# Patient Record
Sex: Female | Born: 1990 | Race: Black or African American | Hispanic: No | Marital: Single | Smoking: Former smoker
Health system: Southern US, Community
[De-identification: ages and names within clinical notes are randomized; demographics above are authoritative.]

## PROBLEM LIST (undated history)

## (undated) DIAGNOSIS — Z3490 Encounter for supervision of normal pregnancy, unspecified, unspecified trimester: Principal | ICD-10-CM

## (undated) DIAGNOSIS — G8929 Other chronic pain: Secondary | ICD-10-CM

## (undated) DIAGNOSIS — F32A Depression, unspecified: Secondary | ICD-10-CM

## (undated) DIAGNOSIS — R87629 Unspecified abnormal cytological findings in specimens from vagina: Secondary | ICD-10-CM

## (undated) DIAGNOSIS — Z789 Other specified health status: Secondary | ICD-10-CM

## (undated) DIAGNOSIS — F419 Anxiety disorder, unspecified: Secondary | ICD-10-CM

## (undated) DIAGNOSIS — F99 Mental disorder, not otherwise specified: Secondary | ICD-10-CM

## (undated) DIAGNOSIS — F329 Major depressive disorder, single episode, unspecified: Secondary | ICD-10-CM

## (undated) HISTORY — DX: Other chronic pain: G89.29

## (undated) HISTORY — DX: Depression, unspecified: F32.A

## (undated) HISTORY — DX: Encounter for supervision of normal pregnancy, unspecified, unspecified trimester: Z34.90

## (undated) HISTORY — DX: Unspecified abnormal cytological findings in specimens from vagina: R87.629

## (undated) HISTORY — PX: EXTERNAL EAR SURGERY: SHX627

## (undated) HISTORY — DX: Anxiety disorder, unspecified: F41.9

## (undated) HISTORY — DX: Mental disorder, not otherwise specified: F99

## (undated) HISTORY — DX: Other specified health status: Z78.9

## (undated) HISTORY — PX: LEG SURGERY: SHX1003

---
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
## 2012-06-16 ENCOUNTER — Other Ambulatory Visit: Payer: Self-pay | Admitting: *Deleted

## 2012-06-16 MED ORDER — NORGESTIM-ETH ESTRAD TRIPHASIC 0.18/0.215/0.25 MG-25 MCG PO TABS: 1.0000 | ORAL_TABLET | Freq: Every day | ORAL | Status: DC

## 2012-07-20 ENCOUNTER — Other Ambulatory Visit: Payer: Self-pay | Admitting: Nurse Practitioner

## 2012-07-22 NOTE — Telephone Encounter (Signed)
Patient last seen in office on 07-02-11 by [facility]. Please advise

## 2013-03-31 ENCOUNTER — Ambulatory Visit: Payer: Self-pay | Admitting: General Practice

## 2013-04-03 ENCOUNTER — Encounter: Payer: Self-pay | Admitting: General Practice

## 2013-04-03 ENCOUNTER — Ambulatory Visit (INDEPENDENT_AMBULATORY_CARE_PROVIDER_SITE_OTHER): Payer: Medicaid Other | Admitting: General Practice

## 2013-04-03 ENCOUNTER — Encounter (INDEPENDENT_AMBULATORY_CARE_PROVIDER_SITE_OTHER): Payer: Self-pay

## 2013-04-03 VITALS — BP 134/92 | HR 83 | Temp 97.1°F | Ht 66.0 in | Wt 211.0 lb

## 2013-04-03 DIAGNOSIS — N926 Irregular menstruation, unspecified: Secondary | ICD-10-CM

## 2013-04-03 DIAGNOSIS — Z3490 Encounter for supervision of normal pregnancy, unspecified, unspecified trimester: Secondary | ICD-10-CM

## 2013-04-03 LAB — POCT URINE PREGNANCY: PREG TEST UR: POSITIVE

## 2013-04-03 MED ORDER — PRENATAL VITAMINS 28-0.8 MG PO TABS
1.0000 | ORAL_TABLET | Freq: Every day | ORAL | Status: DC
Start: 2013-04-03 — End: 2013-06-15

## 2013-04-03 NOTE — Progress Notes (Signed)
   Subjective:    Patient ID: Tiffany [name]. [name], female    DOB: [birth year], 23 y.o.   MRN: [id number]  HPI Patient presents today for pregnancy test. She reports taking home pregnancy test with positive results. Reports last menstrual cycle began on February 04, 2013. Denies spotting or abdominal pain.     Review of Systems  Constitutional: Negative for fever and chills.  Respiratory: Negative for chest tightness and shortness of breath.   Cardiovascular: Negative for chest pain and palpitations.  Gastrointestinal: Negative for abdominal pain.  Neurological: Negative for dizziness, weakness and headaches.  Psychiatric/Behavioral: Negative for suicidal ideas, sleep disturbance and self-injury.  All other systems reviewed and are negative.       Objective:   Physical Exam  Constitutional: She is oriented to person, place, and time. She appears well-developed and well-nourished.  Cardiovascular: Normal rate, regular rhythm and normal heart sounds.   Pulmonary/Chest: Effort normal and breath sounds normal. No respiratory distress. She exhibits no tenderness.  Abdominal: Soft. Bowel sounds are normal. She exhibits no distension. There is no tenderness.  Neurological: She is alert and oriented to person, place, and time.  Skin: Skin is warm and dry.  Psychiatric: She has a normal mood and affect.      Results for orders placed in visit on 04/03/13  POCT URINE PREGNANCY      Result Value Ref Range   Preg Test, Ur Positive         Assessment & Plan:  1. Irregular menses  - POCT urine pregnancy  2. Pregnancy  - Ambulatory referral to Obstetrics / Gynecology - Prenatal Vit-Fe Fumarate-FA (PRENATAL VITAMINS) 28-0.8 MG TABS; Take 1 tablet by mouth daily.  Dispense: 30 tablet; Refill: 1 -EDD November 11, 2013 -may seek emergency medical treatment  -Patient verbalized understanding Erby Pian, FNP-C

## 2013-04-03 NOTE — Patient Instructions (Signed)

## 2013-06-15 ENCOUNTER — Ambulatory Visit (INDEPENDENT_AMBULATORY_CARE_PROVIDER_SITE_OTHER): Payer: Medicaid Other | Admitting: Family Medicine

## 2013-06-15 ENCOUNTER — Encounter: Payer: Self-pay | Admitting: Family Medicine

## 2013-06-15 VITALS — BP 142/107 | HR 69 | Temp 98.5°F | Ht 66.0 in | Wt 222.2 lb

## 2013-06-15 DIAGNOSIS — D2330 Other benign neoplasm of skin of unspecified part of face: Secondary | ICD-10-CM

## 2013-06-15 MED ORDER — AMOXICILLIN 875 MG PO TABS: 875.0000 mg | ORAL_TABLET | Freq: Two times a day (BID) | ORAL | Status: DC

## 2013-06-15 NOTE — Progress Notes (Signed)
   Subjective:    Patient ID: Tiffany [name]. Tiffany [name], female    DOB: [birth year], 23 y.o.   MRN: [id number]  HPI C/o cyst left ear.  She has had this for a few days and wants to have it I&D'd. She has had this cyst happen before and she has had to have I&D and then it Comes back   Review of Systems    No chest pain, SOB, HA, dizziness, vision change, N/V, diarrhea, constipation, dysuria, urinary urgency or frequency, myalgias, arthralgias or rash.  Objective:   Physical Exam Vital signs noted  Well developed well nourished female.  HEENT - Head atraumatic Normocephalic Respiratory - Lungs CTA bilateral Cardiac - RRR S1 and S2 without murmur Skin - cyst left auricular area without fluctuance and tenderness       Assessment & Plan:  Cyst, dermoid, face - Plan: Ambulatory referral to Dermatology, amoxicillin (AMOXIL) 875 MG tablet Po bid x 10 days  Lysbeth Penner FNP

## 2014-01-01 ENCOUNTER — Encounter: Payer: Self-pay | Admitting: Adult Health

## 2014-01-01 ENCOUNTER — Ambulatory Visit (INDEPENDENT_AMBULATORY_CARE_PROVIDER_SITE_OTHER): Payer: Medicaid Other | Admitting: Adult Health

## 2014-01-01 VITALS — BP 118/70 | Ht 66.0 in | Wt 221.0 lb

## 2014-01-01 DIAGNOSIS — Z3201 Encounter for pregnancy test, result positive: Secondary | ICD-10-CM

## 2014-01-01 DIAGNOSIS — Z3490 Encounter for supervision of normal pregnancy, unspecified, unspecified trimester: Secondary | ICD-10-CM

## 2014-01-01 LAB — POCT URINE PREGNANCY: PREG TEST UR: POSITIVE

## 2014-01-01 MED ORDER — PRENATAL PLUS 27-1 MG PO TABS: 1.0000 | ORAL_TABLET | Freq: Every day | ORAL | Status: DC

## 2014-01-01 NOTE — Patient Instructions (Signed)
First Trimester of Pregnancy The first trimester of pregnancy is from week 1 until the end of week 12 (months 1 through 3). A week after a sperm fertilizes an egg, the egg will implant on the wall of the uterus. This embryo will begin to develop into a baby. Genes from you and your partner are forming the baby. The female genes determine whether the baby is a boy or a girl. At 6-8 weeks, the eyes and face are formed, and the heartbeat can be seen on ultrasound. At the end of 12 weeks, all the baby's organs are formed.  Now that you are pregnant, you will want to do everything you can to have a healthy baby. Two of the most important things are to get good prenatal care and to follow your health care provider's instructions. Prenatal care is all the medical care you receive before the baby's birth. This care will help prevent, find, and treat any problems during the pregnancy and childbirth. BODY CHANGES Your body goes through many changes during pregnancy. The changes vary from woman to woman.   You may gain or lose a couple of pounds at first.  You may feel sick to your stomach (nauseous) and throw up (vomit). If the vomiting is uncontrollable, call your health care provider.  You may tire easily.  You may develop headaches that can be relieved by medicines approved by your health care provider.  You may urinate more often. Painful urination may mean you have a bladder infection.  You may develop heartburn as a result of your pregnancy.  You may develop constipation because certain hormones are causing the muscles that push waste through your intestines to slow down.  You may develop hemorrhoids or swollen, bulging veins (varicose veins).  Your breasts may begin to grow larger and become tender. Your nipples may stick out more, and the tissue that surrounds them (areola) may become darker.  Your gums may bleed and may be sensitive to brushing and flossing.  Dark spots or blotches (chloasma,  mask of pregnancy) may develop on your face. This will likely fade after the baby is born.  Your menstrual periods will stop.  You may have a loss of appetite.  You may develop cravings for certain kinds of food.  You may have changes in your emotions from day to day, such as being excited to be pregnant or being concerned that something may go wrong with the pregnancy and baby.  You may have more vivid and strange dreams.  You may have changes in your hair. These can include thickening of your hair, rapid growth, and changes in texture. Some women also have hair loss during or after pregnancy, or hair that feels dry or thin. Your hair will most likely return to normal after your baby is born. WHAT TO EXPECT AT YOUR PRENATAL VISITS During a routine prenatal visit:  You will be weighed to make sure you and the baby are growing normally.  Your blood pressure will be taken.  Your abdomen will be measured to track your baby's growth.  The fetal heartbeat will be listened to starting around week 10 or 12 of your pregnancy.  Test results from any previous visits will be discussed. Your health care provider may ask you:  How you are feeling.  If you are feeling the baby move.  If you have had any abnormal symptoms, such as leaking fluid, bleeding, severe headaches, or abdominal cramping.  If you have any questions. Other tests   that may be performed during your first trimester include:  Blood tests to find your blood type and to check for the presence of any previous infections. They will also be used to check for low iron levels (anemia) and Rh antibodies. Later in the pregnancy, blood tests for diabetes will be done along with other tests if problems develop.  Urine tests to check for infections, diabetes, or protein in the urine.  An ultrasound to confirm the proper growth and development of the baby.  An amniocentesis to check for possible genetic problems.  Fetal screens for  spina bifida and Down syndrome.  You may need other tests to make sure you and the baby are doing well. HOME CARE INSTRUCTIONS  Medicines  Follow your health care provider's instructions regarding medicine use. Specific medicines may be either safe or unsafe to take during pregnancy.  Take your prenatal vitamins as directed.  If you develop constipation, try taking a stool softener if your health care provider approves. Diet  Eat regular, well-balanced meals. Choose a variety of foods, such as meat or vegetable-based protein, fish, milk and low-fat dairy products, vegetables, fruits, and whole grain breads and cereals. Your health care provider will help you determine the amount of weight gain that is right for you.  Avoid raw meat and uncooked cheese. These carry germs that can cause birth defects in the baby.  Eating four or five small meals rather than three large meals a day may help relieve nausea and vomiting. If you start to feel nauseous, eating a few soda crackers can be helpful. Drinking liquids between meals instead of during meals also seems to help nausea and vomiting.  If you develop constipation, eat more high-fiber foods, such as fresh vegetables or fruit and whole grains. Drink enough fluids to keep your urine clear or pale yellow. Activity and Exercise  Exercise only as directed by your health care provider. Exercising will help you:  Control your weight.  Stay in shape.  Be prepared for labor and delivery.  Experiencing pain or cramping in the lower abdomen or low back is a good sign that you should stop exercising. Check with your health care provider before continuing normal exercises.  Try to avoid standing for long periods of time. Move your legs often if you must stand in one place for a long time.  Avoid heavy lifting.  Wear low-heeled shoes, and practice good posture.  You may continue to have sex unless your health care provider directs you  otherwise. Relief of Pain or Discomfort  Wear a good support bra for breast tenderness.   Take warm sitz baths to soothe any pain or discomfort caused by hemorrhoids. Use hemorrhoid cream if your health care provider approves.   Rest with your legs elevated if you have leg cramps or low back pain.  If you develop varicose veins in your legs, wear support hose. Elevate your feet for 15 minutes, 3-4 times a day. Limit salt in your diet. Prenatal Care  Schedule your prenatal visits by the twelfth week of pregnancy. They are usually scheduled monthly at first, then more often in the last 2 months before delivery.  Write down your questions. Take them to your prenatal visits.  Keep all your prenatal visits as directed by your health care provider. Safety  Wear your seat belt at all times when driving.  Make a list of emergency phone numbers, including numbers for family, friends, the hospital, and police and fire departments. General Tips    Ask your health care provider for a referral to a local prenatal education class. Begin classes no later than at the beginning of month 6 of your pregnancy.  Ask for help if you have counseling or nutritional needs during pregnancy. Your health care provider can offer advice or refer you to specialists for help with various needs.  Do not use hot tubs, steam rooms, or saunas.  Do not douche or use tampons or scented sanitary pads.  Do not cross your legs for long periods of time.  Avoid cat litter boxes and soil used by cats. These carry germs that can cause birth defects in the baby and possibly loss of the fetus by miscarriage or stillbirth.  Avoid all smoking, herbs, alcohol, and medicines not prescribed by your health care provider. Chemicals in these affect the formation and growth of the baby.  Schedule a dentist appointment. At home, brush your teeth with a soft toothbrush and be gentle when you floss. SEEK MEDICAL CARE IF:   You have  dizziness.  You have mild pelvic cramps, pelvic pressure, or nagging pain in the abdominal area.  You have persistent nausea, vomiting, or diarrhea.  You have a bad smelling vaginal discharge.  You have pain with urination.  You notice increased swelling in your face, hands, legs, or ankles. SEEK IMMEDIATE MEDICAL CARE IF:   You have a fever.  You are leaking fluid from your vagina.  You have spotting or bleeding from your vagina.  You have severe abdominal cramping or pain.  You have rapid weight gain or loss.  You vomit blood or material that looks like coffee grounds.  You are exposed to [place] measles and have never had them.  You are exposed to fifth disease or chickenpox.  You develop a severe headache.  You have shortness of breath.  You have any kind of trauma, such as from a fall or a car accident. Document Released: 01/06/2001 Document Revised: 05/29/2013 Document Reviewed: 11/22/2012 [facility] Patient Information 2015 [place], [place]. This information is not intended to replace advice given to you by your health care provider. Make sure you discuss any questions you have with your health care provider. Eat often Follow up in 1 week for [place]

## 2014-01-01 NOTE — Progress Notes (Signed)
Subjective:     Patient ID: Gwynne Edinger. [name], female   DOB: 03-31-1990, 23 y.o.   MRN: [id number]  HPI Franceen is a 23 year old black female in for UPT,has nausea at times, no vomiting.  Review of Systems See HPI Reviewed past medical,surgical, social and family history. Reviewed medications and allergies.     Objective:   Physical Exam BP 118/70 mmHg  Ht 5\' 6"  (1.676 m)  Wt 221 lb (100.245 kg)  BMI 35.69 kg/m2  LMP 11/16/2013   UPT +, about 6+4 weeks by LMP EDD 08/24/14, medicaid form given, eat often and do not drink and eat at same time.  Assessment:    Pregnant +UPT    Plan:     Return in 1 week for dating [place] Rx prenatal plus #30 1 daily with 11 refills Review handout on first trimester

## 2014-01-08 ENCOUNTER — Other Ambulatory Visit: Payer: Self-pay | Admitting: Adult Health

## 2014-01-08 ENCOUNTER — Ambulatory Visit (INDEPENDENT_AMBULATORY_CARE_PROVIDER_SITE_OTHER): Payer: Medicaid Other

## 2014-01-08 DIAGNOSIS — O3680X1 Pregnancy with inconclusive fetal viability, fetus 1: Secondary | ICD-10-CM

## 2014-01-08 DIAGNOSIS — Z3490 Encounter for supervision of normal pregnancy, unspecified, unspecified trimester: Secondary | ICD-10-CM

## 2014-01-08 NOTE — Progress Notes (Signed)
U/S(7+4wks)-single active fetus, CRL c/w dates, cx appears closed, bilateral adnexa appears closed, FHR-154 bpm

## 2014-01-15 ENCOUNTER — Encounter: Payer: Self-pay | Admitting: Women's Health

## 2014-01-15 ENCOUNTER — Ambulatory Visit (INDEPENDENT_AMBULATORY_CARE_PROVIDER_SITE_OTHER): Payer: Medicaid Other | Admitting: Women's Health

## 2014-01-15 VITALS — BP 102/74 | Wt 221.0 lb

## 2014-01-15 DIAGNOSIS — Z1389 Encounter for screening for other disorder: Secondary | ICD-10-CM

## 2014-01-15 DIAGNOSIS — Z331 Pregnant state, incidental: Secondary | ICD-10-CM

## 2014-01-15 DIAGNOSIS — Z640 Problems related to unwanted pregnancy: Secondary | ICD-10-CM

## 2014-01-15 LAB — POCT URINALYSIS DIPSTICK
Blood, UA: NEGATIVE
GLUCOSE UA: NEGATIVE
Ketones, UA: NEGATIVE
LEUKOCYTES UA: NEGATIVE
Nitrite, UA: NEGATIVE
Protein, UA: NEGATIVE

## 2014-01-15 NOTE — Progress Notes (Signed)
Pt here for new ob visit- told nurse she is having an abortion. Pt not seen by me, no need for new ob visit.

## 2014-01-26 NOTE — L&D Delivery Note (Signed)
Delivery Note After AROM at 0533, patient quickly progressed to complete and delivered with 3 pushes.   At 5:50 AM a viable female was delivered via Vaginal, Spontaneous Delivery (Presentation: OA).  APGAR: 9, 9; weight pending .   Placenta status: Intact, Spontaneous.  Cord: 3 vessels with the following complications: None.  Cord pH: n/a  Anesthesia: None  Episiotomy: None Lacerations: None Suture Repair: n/a Est. Blood Loss (mL): 177  Mom to postpartum.  Baby to Couplet care / Skin to Skin.  Virginia Crews, MD, MPH PGY-2,  [facility] 08/09/2014 6:03 AM   Patient is a G2P1001 at [redacted]w[redacted]d who was admitted w/ SOL, uncomplicated prenatal course.  She progressed without augmentation via AROM.  I was gloved and present for delivery in its entirety.  Second stage of labor progressed, baby delivered after a few contractions.  No decels during second stage noted.  Complications: none  Lacerations: none    SHAW, KIMBERLY, CNM 7:10 AM

## 2014-03-20 ENCOUNTER — Encounter: Payer: Self-pay | Admitting: Women's Health

## 2014-03-20 ENCOUNTER — Ambulatory Visit (INDEPENDENT_AMBULATORY_CARE_PROVIDER_SITE_OTHER): Payer: Medicaid Other | Admitting: Women's Health

## 2014-03-20 ENCOUNTER — Other Ambulatory Visit: Payer: Self-pay | Admitting: Women's Health

## 2014-03-20 VITALS — BP 112/62 | HR 104 | Wt 225.0 lb

## 2014-03-20 DIAGNOSIS — Z3492 Encounter for supervision of normal pregnancy, unspecified, second trimester: Secondary | ICD-10-CM

## 2014-03-20 DIAGNOSIS — Z363 Encounter for antenatal screening for malformations: Secondary | ICD-10-CM

## 2014-03-20 DIAGNOSIS — Z1389 Encounter for screening for other disorder: Secondary | ICD-10-CM

## 2014-03-20 DIAGNOSIS — Z331 Pregnant state, incidental: Secondary | ICD-10-CM

## 2014-03-20 DIAGNOSIS — Z118 Encounter for screening for other infectious and parasitic diseases: Secondary | ICD-10-CM

## 2014-03-20 DIAGNOSIS — Z0283 Encounter for blood-alcohol and blood-drug test: Secondary | ICD-10-CM

## 2014-03-20 DIAGNOSIS — Z130 Encounter for screening for diseases of the blood and blood-forming organs and certain disorders involving the immune mechanism: Secondary | ICD-10-CM

## 2014-03-20 DIAGNOSIS — Z113 Encounter for screening for infections with a predominantly sexual mode of transmission: Secondary | ICD-10-CM

## 2014-03-20 DIAGNOSIS — Z3490 Encounter for supervision of normal pregnancy, unspecified, unspecified trimester: Secondary | ICD-10-CM | POA: Insufficient documentation

## 2014-03-20 DIAGNOSIS — Z0184 Encounter for antibody response examination: Secondary | ICD-10-CM

## 2014-03-20 DIAGNOSIS — Z8659 Personal history of other mental and behavioral disorders: Secondary | ICD-10-CM

## 2014-03-20 DIAGNOSIS — O99891 Other specified diseases and conditions complicating pregnancy: Secondary | ICD-10-CM | POA: Insufficient documentation

## 2014-03-20 DIAGNOSIS — O9989 Other specified diseases and conditions complicating pregnancy, childbirth and the puerperium: Secondary | ICD-10-CM

## 2014-03-20 DIAGNOSIS — Z114 Encounter for screening for human immunodeficiency virus [HIV]: Secondary | ICD-10-CM

## 2014-03-20 DIAGNOSIS — Z1159 Encounter for screening for other viral diseases: Secondary | ICD-10-CM

## 2014-03-20 LAB — POCT URINALYSIS DIPSTICK
Glucose, UA: NEGATIVE
Nitrite, UA: NEGATIVE
Protein, UA: NEGATIVE
RBC UA: NEGATIVE

## 2014-03-20 LAB — OB RESULTS CONSOLE RUBELLA ANTIBODY, IGM: Rubella: UNDETERMINED

## 2014-03-20 LAB — OB RESULTS CONSOLE ABO/RH: RH Type: NEGATIVE

## 2014-03-20 LAB — OB RESULTS CONSOLE ANTIBODY SCREEN: ANTIBODY SCREEN: NEGATIVE

## 2014-03-20 LAB — OB RESULTS CONSOLE RPR: RPR: NONREACTIVE

## 2014-03-20 LAB — OB RESULTS CONSOLE HEPATITIS B SURFACE ANTIGEN: Hepatitis B Surface Ag: NEGATIVE

## 2014-03-20 NOTE — Progress Notes (Signed)
  Subjective:  Tiffany [name] is a 24 y.o. G43P1001 African American female at [redacted]w[redacted]d by LMP c/w 7wk u/s, being seen today for her first obstetrical visit.  Her obstetrical history is significant for term uncomplicated SVB x 1.  Had last baby at [facility]- states she is planning on delivering there again- advised that she would need to establish care w/ providers that deliver at [facility] if that were the case. Pt states she 'will try women's'. Was planning TAB earlier in pregnancy, but decided against. 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 Went to ED and dx w/ UTI, on amoxicillin now. Pregnancy history fully reviewed.  Patient reports crying all the time- is able to eat/sleep/still finds joy in things she used to, denies SI/HI. Just upset about pregnancy. 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 Marland Kitchen Denies vb, cramping, uti s/s, abnormal/malodorous vag d/c, or vulvovaginal itching/irritation.  BP 112/62 mmHg  Pulse 104  Wt 225 lb (102.059 kg)  LMP 11/16/2013  HISTORY: OB History  Gravida Para Term Preterm AB SAB TAB Ectopic Multiple Living  2 1 1       1     # Outcome Date GA Lbr Len/2nd Weight Sex Delivery Anes PTL Lv  2 Current           1 Term 09/02/07 [redacted]w[redacted]d   M Vag-Spont  N      Past Medical History  Diagnosis Date  . Pregnant 01/01/2014  . Medical history non-contributory    Past Surgical History  Procedure Laterality Date  . Leg surgery Left   . External ear surgery Left    Family History  Problem Relation Age of Onset  . Asthma Sister   . Asthma Sister   . Hypertension Maternal Grandfather   . Other Maternal Grandfather     blood clots    Exam   System:     General: Well developed & nourished, no acute distress   Skin: Warm & dry, normal coloration and turgor, no rashes   Neurologic: Alert & oriented, normal mood   Cardiovascular: Regular rate & rhythm   Respiratory: Effort & rate normal, LCTAB, acyanotic   Abdomen: Soft, non tender   Extremities: normal strength, tone  Thin prep pap smear 2015 in [place]: normal  per pt  FHR: 150 via doppler   Assessment:   Pregnancy: G2P1001 Patient Active Problem List   Diagnosis Date Noted  . Supervision of normal pregnancy 03/20/2014    Priority: High  . Pregnant 01/01/2014    [redacted]w[redacted]d G2P1001 New OB visit   Plan:  Initial labs drawn Continue prenatal vitamins Problem list reviewed and updated Reviewed n/v relief measures and warning s/s to report Reviewed recommended weight gain based on pre-gravid BMI Encouraged well-balanced diet Genetic Screening discussed Quad Screen: declined Cystic fibrosis screening discussed declined Ultrasound discussed; fetal survey: requested Follow up in 3 weeks for anatomy u/s and visit [place] completed Discussed normal to be upset when was not planning pregnancy. If sleep/eating habits/joy in things she used to find joy in become affected or develops SI/HI, to let us know.  Get pap records from [place], [facility] 03/20/2014 2:04 PM

## 2014-03-20 NOTE — Patient Instructions (Signed)
Second Trimester of Pregnancy The second trimester is from week 13 through week 28, months 4 through 6. The second trimester is often a time when you feel your best. Your body has also adjusted to being pregnant, and you begin to feel better physically. Usually, morning sickness has lessened or quit completely, you may have more energy, and you may have an increase in appetite. The second trimester is also a time when the fetus is growing rapidly. At the end of the sixth month, the fetus is about 9 inches long and weighs about 1 pounds. You will likely begin to feel the baby move (quickening) between 18 and 20 weeks of the pregnancy. BODY CHANGES Your body goes through many changes during pregnancy. The changes vary from woman to woman.   Your weight will continue to increase. You will notice your lower abdomen bulging out.  You may begin to get stretch marks on your hips, abdomen, and breasts.  You may develop headaches that can be relieved by medicines approved by your health care provider.  You may urinate more often because the fetus is pressing on your bladder.  You may develop or continue to have heartburn as a result of your pregnancy.  You may develop constipation because certain hormones are causing the muscles that push waste through your intestines to slow down.  You may develop hemorrhoids or swollen, bulging veins (varicose veins).  You may have back pain because of the weight gain and pregnancy hormones relaxing your joints between the bones in your pelvis and as a result of a shift in weight and the muscles that support your balance.  Your breasts will continue to grow and be tender.  Your gums may bleed and may be sensitive to brushing and flossing.  Dark spots or blotches (chloasma, mask of pregnancy) may develop on your face. This will likely fade after the baby is born.  A dark line from your belly button to the pubic area (linea nigra) may appear. This will likely fade  after the baby is born.  You may have changes in your hair. These can include thickening of your hair, rapid growth, and changes in texture. Some women also have hair loss during or after pregnancy, or hair that feels dry or thin. Your hair will most likely return to normal after your baby is born. WHAT TO EXPECT AT YOUR PRENATAL VISITS During a routine prenatal visit:  You will be weighed to make sure you and the fetus are growing normally.  Your blood pressure will be taken.  Your abdomen will be measured to track your baby's growth.  The fetal heartbeat will be listened to.  Any test results from the previous visit will be discussed. Your health care provider may ask you:  How you are feeling.  If you are feeling the baby move.  If you have had any abnormal symptoms, such as leaking fluid, bleeding, severe headaches, or abdominal cramping.  If you have any questions. Other tests that may be performed during your second trimester include:  Blood tests that check for:  Low iron levels (anemia).  Gestational diabetes (between 24 and 28 weeks).  Rh antibodies.  Urine tests to check for infections, diabetes, or protein in the urine.  An ultrasound to confirm the proper growth and development of the baby.  An amniocentesis to check for possible genetic problems.  Fetal screens for spina bifida and Down syndrome. HOME CARE INSTRUCTIONS   Avoid all smoking, herbs, alcohol, and unprescribed   drugs. These chemicals affect the formation and growth of the baby.  Follow your health care provider's instructions regarding medicine use. There are medicines that are either safe or unsafe to take during pregnancy.  Exercise only as directed by your health care provider. Experiencing uterine cramps is a good sign to stop exercising.  Continue to eat regular, healthy meals.  Wear a good support bra for breast tenderness.  Do not use hot tubs, steam rooms, or saunas.  Wear your  seat belt at all times when driving.  Avoid raw meat, uncooked cheese, cat litter boxes, and soil used by cats. These carry germs that can cause birth defects in the baby.  Take your prenatal vitamins.  Try taking a stool softener (if your health care provider approves) if you develop constipation. Eat more high-fiber foods, such as fresh vegetables or fruit and whole grains. Drink plenty of fluids to keep your urine clear or pale yellow.  Take warm sitz baths to soothe any pain or discomfort caused by hemorrhoids. Use hemorrhoid cream if your health care provider approves.  If you develop varicose veins, wear support hose. Elevate your feet for 15 minutes, 3-4 times a day. Limit salt in your diet.  Avoid heavy lifting, wear low heel shoes, and practice good posture.  Rest with your legs elevated if you have leg cramps or low back pain.  Visit your dentist if you have not gone yet during your pregnancy. Use a soft toothbrush to brush your teeth and be gentle when you floss.  A sexual relationship may be continued unless your health care provider directs you otherwise.  Continue to go to all your prenatal visits as directed by your health care provider. SEEK MEDICAL CARE IF:   You have dizziness.  You have mild pelvic cramps, pelvic pressure, or nagging pain in the abdominal area.  You have persistent nausea, vomiting, or diarrhea.  You have a bad smelling vaginal discharge.  You have pain with urination. SEEK IMMEDIATE MEDICAL CARE IF:   You have a fever.  You are leaking fluid from your vagina.  You have spotting or bleeding from your vagina.  You have severe abdominal cramping or pain.  You have rapid weight gain or loss.  You have shortness of breath with chest pain.  You notice sudden or extreme swelling of your face, hands, ankles, feet, or legs.  You have not felt your baby move in over an hour.  You have severe headaches that do not go away with  medicine.  You have vision changes. Document Released: 01/06/2001 Document Revised: 01/17/2013 Document Reviewed: 03/15/2012 [employer] Patient Information 2015 [employer]. This information is not intended to replace advice given to you by your health care provider. Make sure you discuss any questions you have with your health care provider.  

## 2014-03-21 LAB — CBC
HCT: 35.3 % (ref 34.0–46.6)
Hemoglobin: 11.9 g/dL (ref 11.1–15.9)
MCH: 27 pg (ref 26.6–33.0)
MCHC: 33.7 g/dL (ref 31.5–35.7)
MCV: 80 fL (ref 79–97)
PLATELETS: 299 10*3/uL (ref 150–379)
RBC: 4.4 x10E6/uL (ref 3.77–5.28)
RDW: 14.3 % (ref 12.3–15.4)
WBC: 7.8 10*3/uL (ref 3.4–10.8)

## 2014-03-21 LAB — HEPATITIS B SURFACE ANTIGEN: Hepatitis B Surface Ag: NEGATIVE

## 2014-03-21 LAB — URINALYSIS, ROUTINE W REFLEX MICROSCOPIC
BILIRUBIN UA: NEGATIVE
Glucose, UA: NEGATIVE
NITRITE UA: NEGATIVE
PH UA: 7.5 (ref 5.0–7.5)
RBC, UA: NEGATIVE
SPEC GRAV UA: 1.024 (ref 1.005–1.030)
Urobilinogen, Ur: 1 mg/dL (ref 0.2–1.0)

## 2014-03-21 LAB — RPR: RPR: NONREACTIVE

## 2014-03-21 LAB — SICKLE CELL SCREEN: Sickle Cell Screen: NEGATIVE

## 2014-03-21 LAB — ANTIBODY SCREEN: Antibody Screen: NEGATIVE

## 2014-03-21 LAB — MICROSCOPIC EXAMINATION
Casts: NONE SEEN /lpf
Epithelial Cells (non renal): >10 /hpf — AB (ref 0–10)

## 2014-03-21 LAB — ABO/RH: Rh Factor: NEGATIVE

## 2014-03-21 LAB — VARICELLA ZOSTER ANTIBODY, IGG: Varicella zoster IgG: 692 index (ref >165)

## 2014-03-21 LAB — PMP SCREEN PROFILE (10S), URINE
AMPHETAMINE SCRN UR: NEGATIVE ng/mL
Barbiturate Screen, Ur: NEGATIVE ng/mL
Benzodiazepine Screen, Urine: NEGATIVE ng/mL
CANNABINOIDS UR QL SCN: POSITIVE ng/mL
COCAINE(METAB.) SCREEN, URINE: NEGATIVE ng/mL
Creatinine(Crt), U: 160.9 mg/dL (ref 20.0–300.0)
METHADONE SCREEN, URINE: NEGATIVE ng/mL
OPIATE SCRN UR: NEGATIVE ng/mL
Oxycodone+Oxymorphone Ur Ql Scn: NEGATIVE ng/mL
PCP Scrn, Ur: NEGATIVE ng/mL
PH UR, DRUG SCRN: 7.3 (ref 4.5–8.9)
Propoxyphene, Screen: NEGATIVE ng/mL

## 2014-03-21 LAB — HIV ANTIBODY (ROUTINE TESTING W REFLEX): HIV Screen 4th Generation wRfx: NONREACTIVE

## 2014-03-21 LAB — RUBELLA SCREEN: Rubella Antibodies, IGG: <0.9 index — ABNORMAL LOW (ref >0.99)

## 2014-03-22 LAB — URINE CULTURE: Organism ID, Bacteria: NO GROWTH

## 2014-03-22 LAB — GC/CHLAMYDIA PROBE AMP
Chlamydia trachomatis, NAA: NEGATIVE
NEISSERIA GONORRHOEAE BY PCR: NEGATIVE

## 2014-03-24 ENCOUNTER — Encounter: Payer: Self-pay | Admitting: Women's Health

## 2014-03-24 DIAGNOSIS — F1290 Cannabis use, unspecified, uncomplicated: Secondary | ICD-10-CM | POA: Insufficient documentation

## 2014-03-24 DIAGNOSIS — O09899 Supervision of other high risk pregnancies, unspecified trimester: Secondary | ICD-10-CM | POA: Insufficient documentation

## 2014-03-24 DIAGNOSIS — O9989 Other specified diseases and conditions complicating pregnancy, childbirth and the puerperium: Secondary | ICD-10-CM

## 2014-03-24 DIAGNOSIS — Z283 Underimmunization status: Secondary | ICD-10-CM | POA: Insufficient documentation

## 2014-03-24 DIAGNOSIS — Z6791 Unspecified blood type, Rh negative: Secondary | ICD-10-CM | POA: Insufficient documentation

## 2014-03-24 DIAGNOSIS — O26899 Other specified pregnancy related conditions, unspecified trimester: Secondary | ICD-10-CM

## 2014-04-03 ENCOUNTER — Telehealth: Payer: Self-pay | Admitting: *Deleted

## 2014-04-03 NOTE — Telephone Encounter (Signed)
Pt informed it is safe to eat crab legs during pregnancy.

## 2014-04-12 ENCOUNTER — Other Ambulatory Visit: Payer: Self-pay | Admitting: Women's Health

## 2014-04-12 ENCOUNTER — Encounter: Payer: Self-pay | Admitting: *Deleted

## 2014-04-12 ENCOUNTER — Other Ambulatory Visit: Payer: Medicaid Other

## 2014-04-12 ENCOUNTER — Ambulatory Visit (INDEPENDENT_AMBULATORY_CARE_PROVIDER_SITE_OTHER): Payer: Medicaid Other | Admitting: Obstetrics & Gynecology

## 2014-04-12 ENCOUNTER — Encounter: Payer: Self-pay | Admitting: Obstetrics & Gynecology

## 2014-04-12 ENCOUNTER — Encounter: Payer: Medicaid Other | Admitting: Advanced Practice Midwife

## 2014-04-12 ENCOUNTER — Ambulatory Visit (INDEPENDENT_AMBULATORY_CARE_PROVIDER_SITE_OTHER): Payer: Medicaid Other

## 2014-04-12 VITALS — BP 118/60 | HR 80 | Wt 224.0 lb

## 2014-04-12 DIAGNOSIS — O99320 Drug use complicating pregnancy, unspecified trimester: Secondary | ICD-10-CM

## 2014-04-12 DIAGNOSIS — Z1389 Encounter for screening for other disorder: Secondary | ICD-10-CM

## 2014-04-12 DIAGNOSIS — Z331 Pregnant state, incidental: Secondary | ICD-10-CM

## 2014-04-12 DIAGNOSIS — Z363 Encounter for antenatal screening for malformations: Secondary | ICD-10-CM

## 2014-04-12 DIAGNOSIS — Z36 Encounter for antenatal screening of mother: Secondary | ICD-10-CM | POA: Diagnosis not present

## 2014-04-12 DIAGNOSIS — Z3492 Encounter for supervision of normal pregnancy, unspecified, second trimester: Secondary | ICD-10-CM

## 2014-04-12 LAB — POCT URINALYSIS DIPSTICK
Blood, UA: NEGATIVE
Glucose, UA: NEGATIVE
Ketones, UA: NEGATIVE
Leukocytes, UA: NEGATIVE
Nitrite, UA: NEGATIVE
Protein, UA: NEGATIVE

## 2014-04-12 NOTE — Progress Notes (Signed)
U/S(21+0wks)-active fetus, meas c/w dates, fluid WNL, FHR-147 bpm, cx appears closed(4.7cm), bilateral adnexa appears WNL, posterior Gr 0 placenta, female fetus, no major abnl noted

## 2014-04-13 NOTE — Progress Notes (Signed)
Sonogram is reviewed and read today, report is done, all normal  G2P1001 [redacted]w[redacted]d Estimated Date of Delivery: 08/23/14  Blood pressure 118/60, pulse 80, weight 224 lb (101.606 kg), last menstrual period 11/16/2013.   BP weight and urine results all reviewed and noted.  Please refer to the obstetrical flow sheet for the fundal height and fetal heart rate documentation:  Patient reports good fetal movement, denies any bleeding and no rupture of membranes symptoms or regular contractions. Patient is without complaints. All questions were answered.  Plan:  Continued routine obstetrical care,   Follow up in 4 weeks for OB appointment,

## 2014-05-10 ENCOUNTER — Encounter: Payer: Self-pay | Admitting: Advanced Practice Midwife

## 2014-05-10 ENCOUNTER — Ambulatory Visit (INDEPENDENT_AMBULATORY_CARE_PROVIDER_SITE_OTHER): Payer: Medicaid Other | Admitting: Advanced Practice Midwife

## 2014-05-10 VITALS — BP 110/76 | HR 88 | Wt 231.0 lb

## 2014-05-10 DIAGNOSIS — Z331 Pregnant state, incidental: Secondary | ICD-10-CM

## 2014-05-10 DIAGNOSIS — Z3492 Encounter for supervision of normal pregnancy, unspecified, second trimester: Secondary | ICD-10-CM

## 2014-05-10 DIAGNOSIS — Z1389 Encounter for screening for other disorder: Secondary | ICD-10-CM

## 2014-05-10 LAB — POCT URINALYSIS DIPSTICK
Glucose, UA: NEGATIVE
Ketones, UA: NEGATIVE
Leukocytes, UA: NEGATIVE
NITRITE UA: NEGATIVE
Protein, UA: NEGATIVE
RBC UA: NEGATIVE

## 2014-05-10 NOTE — Progress Notes (Signed)
G2P1001 [redacted]w[redacted]d Estimated Date of Delivery: 08/23/14  Blood pressure 110/76, pulse 88, weight 231 lb (104.781 kg), last menstrual period 11/16/2013.   BP weight and urine results all reviewed and noted.  Please refer to the obstetrical flow sheet for the fundal height and fetal heart rate documentation:  Patient reports good fetal movement, denies any bleeding and no rupture of membranes symptoms or regular contractions. Patient is without complaints. All questions were answered.  Plan:  Continued routine obstetrical care,   Follow up in 3 weeks for OB appointment, PN2

## 2014-05-10 NOTE — Progress Notes (Signed)
Pt states that she has noticed some pain in her lower abdomin and on her sides, pain comes more at night.

## 2014-05-10 NOTE — Patient Instructions (Signed)

## 2014-05-31 ENCOUNTER — Other Ambulatory Visit: Payer: Medicaid Other

## 2014-05-31 ENCOUNTER — Other Ambulatory Visit: Payer: Self-pay | Admitting: Advanced Practice Midwife

## 2014-05-31 ENCOUNTER — Ambulatory Visit (INDEPENDENT_AMBULATORY_CARE_PROVIDER_SITE_OTHER): Payer: Medicaid Other | Admitting: Advanced Practice Midwife

## 2014-05-31 VITALS — BP 110/56 | HR 71 | Wt 237.0 lb

## 2014-05-31 DIAGNOSIS — Z131 Encounter for screening for diabetes mellitus: Secondary | ICD-10-CM

## 2014-05-31 DIAGNOSIS — Z369 Encounter for antenatal screening, unspecified: Secondary | ICD-10-CM

## 2014-05-31 DIAGNOSIS — Z3492 Encounter for supervision of normal pregnancy, unspecified, second trimester: Secondary | ICD-10-CM

## 2014-05-31 DIAGNOSIS — Z1389 Encounter for screening for other disorder: Secondary | ICD-10-CM

## 2014-05-31 DIAGNOSIS — Z331 Pregnant state, incidental: Secondary | ICD-10-CM

## 2014-05-31 LAB — OB RESULTS CONSOLE HIV ANTIBODY (ROUTINE TESTING): HIV: NONREACTIVE

## 2014-05-31 LAB — POCT URINALYSIS DIPSTICK
GLUCOSE UA: NEGATIVE
Ketones, UA: NEGATIVE
Leukocytes, UA: NEGATIVE
Nitrite, UA: NEGATIVE
PROTEIN UA: NEGATIVE
RBC UA: NEGATIVE

## 2014-05-31 MED ORDER — PNV PRENATAL PLUS MULTIVITAMIN 27-1 MG PO TABS: 1.0000 | ORAL_TABLET | Freq: Every day | ORAL | Status: DC

## 2014-05-31 NOTE — Patient Instructions (Signed)
Sciatica with Rehab The sciatic nerve runs from the back down the leg and is responsible for sensation and control of the muscles in the back (posterior) side of the thigh, lower leg, and foot. Sciatica is a condition that is characterized by inflammation of this nerve.  SYMPTOMS   Signs of nerve damage, including numbness and/or weakness along the posterior side of the lower extremity.  Pain in the back of the thigh that may also travel down the leg.  Pain that worsens when sitting for long periods of time.  Occasionally, pain in the back or buttock. CAUSES  Inflammation of the sciatic nerve is the cause of sciatica. The inflammation is due to something irritating the nerve. Common sources of irritation include:  Sitting for long periods of time.  Direct trauma to the nerve.  Arthritis of the spine.  Herniated or ruptured disk.  Slipping of the vertebrae (spondylolisthesis).  Pressure from soft tissues, such as muscles or ligament-like tissue (fascia). RISK INCREASES WITH:  Sports that place pressure or stress on the spine (football or weightlifting).  Poor strength and flexibility.  Failure to warm up properly before activity.  Family history of low back pain or disk disorders.  Previous back injury or surgery.  Poor body mechanics, especially when lifting, or poor posture. PREVENTION   Warm up and stretch properly before activity.  Maintain physical fitness:  Strength, flexibility, and endurance.  Cardiovascular fitness.  Learn and use proper technique, especially with posture and lifting. When possible, have coach correct improper technique.  Avoid activities that place stress on the spine. PROGNOSIS If treated properly, then sciatica usually resolves within 6 weeks. However, occasionally surgery is necessary.  RELATED COMPLICATIONS   Permanent nerve damage, including pain, numbness, tingle, or weakness.  Chronic back pain.  Risks of surgery: infection,  bleeding, nerve damage, or damage to surrounding tissues. TREATMENT Treatment initially involves resting from any activities that aggravate your symptoms. The use of ice and medication may help reduce pain and inflammation. The use of strengthening and stretching exercises may help reduce pain with activity. These exercises may be performed at home or with referral to a therapist. A therapist may recommend further treatments, such as transcutaneous electronic nerve stimulation (TENS) or ultrasound. Your caregiver may recommend corticosteroid injections to help reduce inflammation of the sciatic nerve. If symptoms persist despite non-surgical (conservative) treatment, then surgery may be recommended. MEDICATION  If pain medication is necessary, then nonsteroidal anti-inflammatory medications, such as aspirin and ibuprofen, or other minor pain relievers, such as acetaminophen, are often recommended.  Do not take pain medication for 7 days before surgery.  Prescription pain relievers may be given if deemed necessary by your caregiver. Use only as directed and only as much as you need.  Ointments applied to the skin may be helpful.  Corticosteroid injections may be given by your caregiver. These injections should be reserved for the most serious cases, because they may only be given a certain number of times. HEAT AND COLD  Cold treatment (icing) relieves pain and reduces inflammation. Cold treatment should be applied for 10 to 15 minutes every 2 to 3 hours for inflammation and pain and immediately after any activity that aggravates your symptoms. Use ice packs or massage the area with a piece of ice (ice massage).  Heat treatment may be used prior to performing the stretching and strengthening activities prescribed by your caregiver, physical therapist, or athletic trainer. Use a heat pack or soak the injury in warm water.   SEEK MEDICAL CARE IF:  Treatment seems to offer no benefit, or the condition  worsens.  Any medications produce adverse side effects. EXERCISES  RANGE OF MOTION (ROM) AND STRETCHING EXERCISES - Sciatica Most people with sciatic will find that their symptoms worsen with either excessive bending forward (flexion) or arching at the low back (extension). The exercises which will help resolve your symptoms will focus on the opposite motion. Your physician, physical therapist or athletic trainer will help you determine which exercises will be most helpful to resolve your low back pain. Do not complete any exercises without first consulting with your clinician. Discontinue any exercises which worsen your symptoms until you speak to your clinician. If you have pain, numbness or tingling which travels down into your buttocks, leg or foot, the goal of the therapy is for these symptoms to move closer to your back and eventually resolve. Occasionally, these leg symptoms will get better, but your low back pain may worsen; this is typically an indication of progress in your rehabilitation. Be certain to be very alert to any changes in your symptoms and the activities in which you participated in the 24 hours prior to the change. Sharing this information with your clinician will allow him/her to most efficiently treat your condition. These exercises may help you when beginning to rehabilitate your injury. Your symptoms may resolve with or without further involvement from your physician, physical therapist or athletic trainer. While completing these exercises, remember:   Restoring tissue flexibility helps normal motion to return to the joints. This allows healthier, less painful movement and activity.  An effective stretch should be held for at least 30 seconds.  A stretch should never be painful. You should only feel a gentle lengthening or release in the stretched tissue. FLEXION RANGE OF MOTION AND STRETCHING EXERCISES: STRETCH - Flexion, Single Knee to Chest   Lie on a firm bed or floor  with both legs extended in front of you.  Keeping one leg in contact with the floor, bring your opposite knee to your chest. Hold your leg in place by either grabbing behind your thigh or at your knee.  Pull until you feel a gentle stretch in your low back. Hold __________ seconds.  Slowly release your grasp and repeat the exercise with the opposite side. Repeat __________ times. Complete this exercise __________ times per day.  STRETCH - Flexion, Double Knee to Chest  Lie on a firm bed or floor with both legs extended in front of you.  Keeping one leg in contact with the floor, bring your opposite knee to your chest.  Tense your stomach muscles to support your back and then lift your other knee to your chest. Hold your legs in place by either grabbing behind your thighs or at your knees.  Pull both knees toward your chest until you feel a gentle stretch in your low back. Hold __________ seconds.  Tense your stomach muscles and slowly return one leg at a time to the floor. Repeat __________ times. Complete this exercise __________ times per day.  STRETCH - Low Trunk Rotation   Lie on a firm bed or floor. Keeping your legs in front of you, bend your knees so they are both pointed toward the ceiling and your feet are flat on the floor.  Extend your arms out to the side. This will stabilize your upper body by keeping your shoulders in contact with the floor.  Gently and slowly drop both knees together to one side until   you feel a gentle stretch in your low back. Hold for __________ seconds.  Tense your stomach muscles to support your low back as you bring your knees back to the starting position. Repeat the exercise to the other side. Repeat __________ times. Complete this exercise __________ times per day  EXTENSION RANGE OF MOTION AND FLEXIBILITY EXERCISES: STRETCH - Extension, Prone on Elbows  Lie on your stomach on the floor, a bed will be too soft. Place your palms about shoulder  width apart and at the height of your head.  Place your elbows under your shoulders. If this is too painful, stack pillows under your chest.  Allow your body to relax so that your hips drop lower and make contact more completely with the floor.  Hold this position for __________ seconds.  Slowly return to lying flat on the floor. Repeat __________ times. Complete this exercise __________ times per day.  RANGE OF MOTION - Extension, Prone Press Ups  Lie on your stomach on the floor, a bed will be too soft. Place your palms about shoulder width apart and at the height of your head.  Keeping your back as relaxed as possible, slowly straighten your elbows while keeping your hips on the floor. You may adjust the placement of your hands to maximize your comfort. As you gain motion, your hands will come more underneath your shoulders.  Hold this position __________ seconds.  Slowly return to lying flat on the floor. Repeat __________ times. Complete this exercise __________ times per day.  STRENGTHENING EXERCISES - Sciatica  These exercises may help you when beginning to rehabilitate your injury. These exercises should be done near your "sweet spot." This is the neutral, low-back arch, somewhere between fully rounded and fully arched, that is your least painful position. When performed in this safe range of motion, these exercises can be used for people who have either a flexion or extension based injury. These exercises may resolve your symptoms with or without further involvement from your physician, physical therapist or athletic trainer. While completing these exercises, remember:   Muscles can gain both the endurance and the strength needed for everyday activities through controlled exercises.  Complete these exercises as instructed by your physician, physical therapist or athletic trainer. Progress with the resistance and repetition exercises only as your caregiver advises.  You may  experience muscle soreness or fatigue, but the pain or discomfort you are trying to eliminate should never worsen during these exercises. If this pain does worsen, stop and make certain you are following the directions exactly. If the pain is still present after adjustments, discontinue the exercise until you can discuss the trouble with your clinician. STRENGTHENING - Deep Abdominals, Pelvic Tilt   Lie on a firm bed or floor. Keeping your legs in front of you, bend your knees so they are both pointed toward the ceiling and your feet are flat on the floor.  Tense your lower abdominal muscles to press your low back into the floor. This motion will rotate your pelvis so that your tail bone is scooping upwards rather than pointing at your feet or into the floor.  With a gentle tension and even breathing, hold this position for __________ seconds. Repeat __________ times. Complete this exercise __________ times per day.  STRENGTHENING - Abdominals, Crunches   Lie on a firm bed or floor. Keeping your legs in front of you, bend your knees so they are both pointed toward the ceiling and your feet are flat on the   floor. Cross your arms over your chest.  Slightly tip your chin down without bending your neck.  Tense your abdominals and slowly lift your trunk high enough to just clear your shoulder blades. Lifting higher can put excessive stress on the low back and does not further strengthen your abdominal muscles.  Control your return to the starting position. Repeat __________ times. Complete this exercise __________ times per day.  STRENGTHENING - Quadruped, Opposite UE/LE Lift  Assume a hands and knees position on a firm surface. Keep your hands under your shoulders and your knees under your hips. You may place padding under your knees for comfort.  Find your neutral spine and gently tense your abdominal muscles so that you can maintain this position. Your shoulders and hips should form a rectangle  that is parallel with the floor and is not twisted.  Keeping your trunk steady, lift your right hand no higher than your shoulder and then your left leg no higher than your hip. Make sure you are not holding your breath. Hold this position __________ seconds.  Continuing to keep your abdominal muscles tense and your back steady, slowly return to your starting position. Repeat with the opposite arm and leg. Repeat __________ times. Complete this exercise __________ times per day.  STRENGTHENING - Abdominals and Quadriceps, Straight Leg Raise   Lie on a firm bed or floor with both legs extended in front of you.  Keeping one leg in contact with the floor, bend the other knee so that your foot can rest flat on the floor.  Find your neutral spine, and tense your abdominal muscles to maintain your spinal position throughout the exercise.  Slowly lift your straight leg off the floor about 6 inches for a count of 15, making sure to not hold your breath.  Still keeping your neutral spine, slowly lower your leg all the way to the floor. Repeat this exercise with each leg __________ times. Complete this exercise __________ times per day. POSTURE AND BODY MECHANICS CONSIDERATIONS - Sciatica Keeping correct posture when sitting, standing or completing your activities will reduce the stress put on different body tissues, allowing injured tissues a chance to heal and limiting painful experiences. The following are general guidelines for improved posture. Your physician or physical therapist will provide you with any instructions specific to your needs. While reading these guidelines, remember:  The exercises prescribed by your provider will help you have the flexibility and strength to maintain correct postures.  The correct posture provides the optimal environment for your joints to work. All of your joints have less wear and tear when properly supported by a spine with good posture. This means you will  experience a healthier, less painful body.  Correct posture must be practiced with all of your activities, especially prolonged sitting and standing. Correct posture is as important when doing repetitive low-stress activities (typing) as it is when doing a single heavy-load activity (lifting). RESTING POSITIONS Consider which positions are most painful for you when choosing a resting position. If you have pain with flexion-based activities (sitting, bending, stooping, squatting), choose a position that allows you to rest in a less flexed posture. You would want to avoid curling into a fetal position on your side. If your pain worsens with extension-based activities (prolonged standing, working overhead), avoid resting in an extended position such as sleeping on your stomach. Most people will find more comfort when they rest with their spine in a more neutral position, neither too rounded nor too   arched. Lying on a non-sagging bed on your side with a pillow between your knees, or on your back with a pillow under your knees will often provide some relief. Keep in mind, being in any one position for a prolonged period of time, no matter how correct your posture, can still lead to stiffness. PROPER SITTING POSTURE In order to minimize stress and discomfort on your spine, you must sit with correct posture Sitting with good posture should be effortless for a healthy body. Returning to good posture is a gradual process. Many people can work toward this most comfortably by using various supports until they have the flexibility and strength to maintain this posture on their own. When sitting with proper posture, your ears will fall over your shoulders and your shoulders will fall over your hips. You should use the back of the chair to support your upper back. Your low back will be in a neutral position, just slightly arched. You may place a small pillow or folded towel at the base of your low back for support.  When  working at a desk, create an environment that supports good, upright posture. Without extra support, muscles fatigue and lead to excessive strain on joints and other tissues. Keep these recommendations in mind: CHAIR:   A chair should be able to slide under your desk when your back makes contact with the back of the chair. This allows you to work closely.  The chair's height should allow your eyes to be level with the upper part of your monitor and your hands to be slightly lower than your elbows. BODY POSITION  Your feet should make contact with the floor. If this is not possible, use a foot rest.  Keep your ears over your shoulders. This will reduce stress on your neck and low back. INCORRECT SITTING POSTURES   If you are feeling tired and unable to assume a healthy sitting posture, do not slouch or slump. This puts excessive strain on your back tissues, causing more damage and pain. Healthier options include:  Using more support, like a lumbar pillow.  Switching tasks to something that requires you to be upright or walking.  Talking a brief walk.  Lying down to rest in a neutral-spine position. PROLONGED STANDING WHILE SLIGHTLY LEANING FORWARD  When completing a task that requires you to lean forward while standing in one place for a long time, place either foot up on a stationary 2-4 inch high object to help maintain the best posture. When both feet are on the ground, the low back tends to lose its slight inward curve. If this curve flattens (or becomes too large), then the back and your other joints will experience too much stress, fatigue more quickly and can cause pain.  CORRECT STANDING POSTURES Proper standing posture should be assumed with all daily activities, even if they only take a few moments, like when brushing your teeth. As in sitting, your ears should fall over your shoulders and your shoulders should fall over your hips. You should keep a slight tension in your abdominal  muscles to brace your spine. Your tailbone should point down to the ground, not behind your body, resulting in an over-extended swayback posture.  INCORRECT STANDING POSTURES  Common incorrect standing postures include a forward head, locked knees and/or an excessive swayback. WALKING Walk with an upright posture. Your ears, shoulders and hips should all line-up. PROLONGED ACTIVITY IN A FLEXED POSITION When completing a task that requires you to bend forward   at your waist or lean over a low surface, try to find a way to stabilize 3 of 4 of your limbs. You can place a hand or elbow on your thigh or rest a knee on the surface you are reaching across. This will provide you more stability so that your muscles do not fatigue as quickly. By keeping your knees relaxed, or slightly bent, you will also reduce stress across your low back. CORRECT LIFTING TECHNIQUES DO :   Assume a wide stance. This will provide you more stability and the opportunity to get as close as possible to the object which you are lifting.  Tense your abdominals to brace your spine; then bend at the knees and hips. Keeping your back locked in a neutral-spine position, lift using your leg muscles. Lift with your legs, keeping your back straight.  Test the weight of unknown objects before attempting to lift them.  Try to keep your elbows locked down at your sides in order get the best strength from your shoulders when carrying an object.  Always ask for help when lifting heavy or awkward objects. INCORRECT LIFTING TECHNIQUES DO NOT:   Lock your knees when lifting, even if it is a small object.  Bend and twist. Pivot at your feet or move your feet when needing to change directions.  Assume that you cannot safely pick up a paperclip without proper posture. Document Released: 01/12/2005 Document Revised: 05/29/2013 Document Reviewed: 04/26/2008 [employer] Patient Information 2015 [employer]. This information is not intended to  replace advice given to you by your health care provider. Make sure you discuss any questions you have with your health care provider.  

## 2014-05-31 NOTE — Addendum Note (Signed)
Addended by: Christin Fudge on: 05/31/2014 10:11 AM   Modules accepted: Orders, Medications

## 2014-05-31 NOTE — Progress Notes (Signed)
G2P1001 [redacted]w[redacted]d Estimated Date of Delivery: 08/23/14  Blood pressure 110/56, pulse 71, weight 237 lb (107.502 kg), last menstrual period 11/16/2013.   BP weight and urine results all reviewed and noted.  Please refer to the obstetrical flow sheet for the fundal height and fetal heart rate documentation:  Patient reports good fetal movement, denies any bleeding and no rupture of membranes symptoms or regular contractions. Patient c/o lower abdominal pain on occasion.  Sounds like round ligament.  All questions were answered.  Plan:  Continued routine obstetrical care, pn2 today  Follow up in 3 weeks for OB appointment,

## 2014-06-01 LAB — HSV 2 ANTIBODY, IGG: HSV 2 Glycoprotein G Ab, IgG: <0.91 index (ref 0.00–0.90)

## 2014-06-01 LAB — GLUCOSE TOLERANCE, 2 HOURS W/ 1HR
Glucose, 1 hour: 124 mg/dL (ref 65–179)
Glucose, 2 hour: 113 mg/dL (ref 65–152)
Glucose, Fasting: 83 mg/dL (ref 65–91)

## 2014-06-01 LAB — CBC
Hematocrit: 33.5 % — ABNORMAL LOW (ref 34.0–46.6)
Hemoglobin: 11.4 g/dL (ref 11.1–15.9)
MCH: 27.2 pg (ref 26.6–33.0)
MCHC: 34 g/dL (ref 31.5–35.7)
MCV: 80 fL (ref 79–97)
Platelets: 258 10*3/uL (ref 150–379)
RBC: 4.19 x10E6/uL (ref 3.77–5.28)
RDW: 14 % (ref 12.3–15.4)
WBC: 10.4 10*3/uL (ref 3.4–10.8)

## 2014-06-01 LAB — ANTIBODY SCREEN: Antibody Screen: NEGATIVE

## 2014-06-01 LAB — SYPHILIS: RPR W/REFLEX TO RPR TITER AND TREPONEMAL ANTIBODIES, TRADITIONAL SCREENING AND DIAGNOSIS ALGORITHM: RPR Ser Ql: NONREACTIVE

## 2014-06-01 LAB — HIV ANTIBODY (ROUTINE TESTING W REFLEX): HIV Screen 4th Generation wRfx: NONREACTIVE

## 2014-06-21 ENCOUNTER — Encounter: Payer: Self-pay | Admitting: Advanced Practice Midwife

## 2014-06-21 ENCOUNTER — Ambulatory Visit (INDEPENDENT_AMBULATORY_CARE_PROVIDER_SITE_OTHER): Payer: Medicaid Other | Admitting: Advanced Practice Midwife

## 2014-06-21 VITALS — BP 112/64 | HR 100 | Wt 234.0 lb

## 2014-06-21 DIAGNOSIS — Z3493 Encounter for supervision of normal pregnancy, unspecified, third trimester: Secondary | ICD-10-CM

## 2014-06-21 DIAGNOSIS — Z1389 Encounter for screening for other disorder: Secondary | ICD-10-CM

## 2014-06-21 DIAGNOSIS — Z331 Pregnant state, incidental: Secondary | ICD-10-CM

## 2014-06-21 DIAGNOSIS — O360131 Maternal care for anti-D [Rh] antibodies, third trimester, fetus 1: Secondary | ICD-10-CM

## 2014-06-21 LAB — POCT URINALYSIS DIPSTICK
Blood, UA: NEGATIVE
Glucose, UA: NEGATIVE
Ketones, UA: NEGATIVE
Leukocytes, UA: NEGATIVE
Nitrite, UA: NEGATIVE
Protein, UA: NEGATIVE

## 2014-06-21 MED ORDER — RHO D IMMUNE GLOBULIN 1500 UNIT/2ML IJ SOSY
300.0000 ug | PREFILLED_SYRINGE | Freq: Once | INTRAMUSCULAR | Status: AC
  Administered 2014-06-21: 300 ug via INTRAMUSCULAR

## 2014-06-21 NOTE — Progress Notes (Signed)
Pt denies any problems or concerns at this time.  

## 2014-06-21 NOTE — Addendum Note (Signed)
Addended by: Linton Rump on: 06/21/2014 10:27 AM   Modules accepted: Orders

## 2014-06-21 NOTE — Progress Notes (Signed)
G2P1001 [redacted]w[redacted]d Estimated Date of Delivery: 08/23/14  Blood pressure 112/64, pulse 100, weight 234 lb (106.142 kg), last menstrual period 11/16/2013.   BP weight and urine results all reviewed and noted.  Please refer to the obstetrical flow sheet for the fundal height and fetal heart rate documentation:  Patient reports good fetal movement, denies any bleeding and no rupture of membranes symptoms or regular contractions. Patient is without complaints. All questions were answered.  Plan:  Continued routine obstetrical care, rhogam today  Follow up in 2 weeks for OB appointment,

## 2014-07-05 ENCOUNTER — Ambulatory Visit (INDEPENDENT_AMBULATORY_CARE_PROVIDER_SITE_OTHER): Payer: Medicaid Other | Admitting: Advanced Practice Midwife

## 2014-07-05 VITALS — BP 120/80 | HR 104 | Wt 234.2 lb

## 2014-07-05 DIAGNOSIS — Z331 Pregnant state, incidental: Secondary | ICD-10-CM

## 2014-07-05 DIAGNOSIS — Z3493 Encounter for supervision of normal pregnancy, unspecified, third trimester: Secondary | ICD-10-CM

## 2014-07-05 DIAGNOSIS — Z1389 Encounter for screening for other disorder: Secondary | ICD-10-CM

## 2014-07-05 LAB — POCT URINALYSIS DIPSTICK
GLUCOSE UA: NEGATIVE
Ketones, UA: NEGATIVE
NITRITE UA: NEGATIVE
PROTEIN UA: NEGATIVE
RBC UA: NEGATIVE

## 2014-07-05 NOTE — Progress Notes (Signed)
G2P1001 [redacted]w[redacted]d Estimated Date of Delivery: 08/23/14  Last menstrual period 11/16/2013.   BP weight and urine results all reviewed and noted.  Please refer to the obstetrical flow sheet for the fundal height and fetal heart rate documentation:  Patient reports good fetal movement, denies any bleeding and no rupture of membranes symptoms or regular contractions. Patient is without complaints. All questions were answered.  Plan:  Continued routine obstetrical care,   Follow up in 2 weeks for OB appointment,

## 2014-07-12 ENCOUNTER — Encounter: Payer: Self-pay | Admitting: Physician Assistant

## 2014-07-12 ENCOUNTER — Ambulatory Visit (INDEPENDENT_AMBULATORY_CARE_PROVIDER_SITE_OTHER): Payer: Medicaid Other | Admitting: Physician Assistant

## 2014-07-12 VITALS — BP 106/66 | HR 83 | Temp 98.5°F | Ht 66.0 in | Wt 232.4 lb

## 2014-07-12 DIAGNOSIS — J309 Allergic rhinitis, unspecified: Secondary | ICD-10-CM | POA: Diagnosis not present

## 2014-07-12 MED ORDER — FLUTICASONE PROPIONATE 50 MCG/ACT NA SUSP: 2.0000 | Freq: Every day | NASAL | Status: DC

## 2014-07-12 MED ORDER — CETIRIZINE HCL 10 MG PO TABS: 10.0000 mg | ORAL_TABLET | Freq: Every day | ORAL | Status: DC

## 2014-07-12 NOTE — Progress Notes (Signed)
   Subjective:    Patient ID: Gwynne Edinger. [name], female    DOB: 12-24-1990, 24 y.o.   MRN: [id number]  HPI 10 y/o34 week  pregnant female presents with cough x 1-2 weeks. She has been taking Robitussin with no relief.     Review of Systems  Constitutional: Negative.   HENT: Positive for congestion (nasal and chest ), postnasal drip, rhinorrhea and sneezing. Negative for sore throat.   Eyes: Positive for discharge (watery ) and itching.  Respiratory: Positive for cough (nonproductive ).        Objective:   Physical Exam  Constitutional: She appears well-developed and well-nourished. No distress.  HENT:  Head: Normocephalic and atraumatic.  Right Ear: External ear normal.  Left Ear: External ear normal.  Mouth/Throat: No oropharyngeal exudate.  Erythematous posterior pharynx bilaterally   Cardiovascular: Normal rate, regular rhythm, normal heart sounds and intact distal pulses.  Exam reveals no gallop and no friction rub.   No murmur heard. Pulmonary/Chest: Effort normal and breath sounds normal. No respiratory distress. She has no wheezes. She has no rales. She exhibits no tenderness.  Skin: She is not diaphoretic.  Psychiatric: She has a normal mood and affect. Her behavior is normal. Judgment and thought content normal.  Nursing note and vitals reviewed.         Assessment & Plan:  1. Allergic rhinitis, unspecified allergic rhinitis type  - cetirizine (ZYRTEC) 10 MG tablet; Take 1 tablet (10 mg total) by mouth daily.  Dispense: 30 tablet; Refill: 11 - fluticasone (FLONASE) 50 MCG/ACT nasal spray; Place 2 sprays into both nostrils daily.  Dispense: 16 g; Refill: 2  Cool mist humidifier Avoid allergens  RTO prn   Neal Oshea A. Benjamin Stain PA-C

## 2014-07-12 NOTE — Patient Instructions (Signed)
Try zyrtec first. If not working after 1 day. May add flonase x 5 days as directed Allergic Rhinitis Allergic rhinitis is when the mucous membranes in the nose respond to allergens. Allergens are particles in the air that cause your body to have an allergic reaction. This causes you to release allergic antibodies. Through a chain of events, these eventually cause you to release histamine into the blood stream. Although meant to protect the body, it is this release of histamine that causes your discomfort, such as frequent sneezing, congestion, and an itchy, runny nose.  CAUSES  Seasonal allergic rhinitis (hay fever) is caused by pollen allergens that may come from grasses, trees, and weeds. Year-round allergic rhinitis (perennial allergic rhinitis) is caused by allergens such as house dust mites, pet dander, and mold spores.  SYMPTOMS   Nasal stuffiness (congestion).  Itchy, runny nose with sneezing and tearing of the eyes. DIAGNOSIS  Your health care provider can help you determine the allergen or allergens that trigger your symptoms. If you and your health care provider are unable to determine the allergen, skin or blood testing may be used. TREATMENT  Allergic rhinitis does not have a cure, but it can be controlled by:  Medicines and allergy shots (immunotherapy).  Avoiding the allergen. Hay fever may often be treated with antihistamines in pill or nasal spray forms. Antihistamines block the effects of histamine. There are over-the-counter medicines that may help with nasal congestion and swelling around the eyes. Check with your health care provider before taking or giving this medicine.  If avoiding the allergen or the medicine prescribed do not work, there are many new medicines your health care provider can prescribe. Stronger medicine may be used if initial measures are ineffective. Desensitizing injections can be used if medicine and avoidance does not work. Desensitization is when a  patient is given ongoing shots until the body becomes less sensitive to the allergen. Make sure you follow up with your health care provider if problems continue. HOME CARE INSTRUCTIONS It is not possible to completely avoid allergens, but you can reduce your symptoms by taking steps to limit your exposure to them. It helps to know exactly what you are allergic to so that you can avoid your specific triggers. SEEK MEDICAL CARE IF:   You have a fever.  You develop a cough that does not stop easily (persistent).  You have shortness of breath.  You start wheezing.  Symptoms interfere with normal daily activities. Document Released: 10/07/2000 Document Revised: 01/17/2013 Document Reviewed: 09/19/2012 [facility] Patient Information 2015 [place], [place]. This information is not intended to replace advice given to you by your health care provider. Make sure you discuss any questions you have with your health care provider.

## 2014-07-23 ENCOUNTER — Encounter: Payer: Self-pay | Admitting: Women's Health

## 2014-07-23 ENCOUNTER — Ambulatory Visit (INDEPENDENT_AMBULATORY_CARE_PROVIDER_SITE_OTHER): Payer: Medicaid Other | Admitting: Women's Health

## 2014-07-23 VITALS — BP 102/64 | HR 84 | Wt 237.0 lb

## 2014-07-23 DIAGNOSIS — Z3493 Encounter for supervision of normal pregnancy, unspecified, third trimester: Secondary | ICD-10-CM

## 2014-07-23 DIAGNOSIS — Z1389 Encounter for screening for other disorder: Secondary | ICD-10-CM

## 2014-07-23 DIAGNOSIS — Z331 Pregnant state, incidental: Secondary | ICD-10-CM

## 2014-07-23 DIAGNOSIS — F1290 Cannabis use, unspecified, uncomplicated: Secondary | ICD-10-CM

## 2014-07-23 LAB — POCT URINALYSIS DIPSTICK
GLUCOSE UA: NEGATIVE
Ketones, UA: NEGATIVE
Leukocytes, UA: NEGATIVE
NITRITE UA: NEGATIVE
PROTEIN UA: NEGATIVE
RBC UA: NEGATIVE

## 2014-07-23 NOTE — Addendum Note (Signed)
Addended by: Traci Sermon A on: 07/23/2014 01:38 PM   Modules accepted: Orders

## 2014-07-23 NOTE — Progress Notes (Signed)
Low-risk OB appointment G2P1001 [redacted]w[redacted]d Estimated Date of Delivery: 08/23/14 BP 102/64 mmHg  Pulse 84  Wt 237 lb (107.502 kg)  LMP 11/16/2013  BP, weight, and urine reviewed.  Refer to obstetrical flow sheet for FH & FHR.  Reports good fm.  Denies regular uc's, lof, vb, or uti s/s. Light spotting after sex Friday, none since. Denies abnormal d/c, itching/odor/irritation. Some achy pains in lower abdomen when raising legs up to get into high jeep.  Reviewed ptl s/s, fkc. Plan:  Continue routine obstetrical care  F/U in 1wk for OB appointment and gbs UDS+ earlier preg for [facility], will repeat today

## 2014-07-23 NOTE — Patient Instructions (Signed)
Call the office [phone]) or go to [facility] if:  You begin to have strong, frequent contractions  Your water breaks.  Sometimes it is a big gush of fluid, sometimes it is just a trickle that keeps getting your panties wet or running down your legs  You have vaginal bleeding.  It is normal to have a small amount of spotting if your cervix was checked.   You don't feel your baby moving like normal.  If you don't, get you something to eat and drink and lay down and focus on feeling your baby move.  You should feel at least 10 movements in 2 hours.  If you don't, you should call the office or go to [place] Preterm labor is when labor starts at less than 37 weeks of pregnancy. The normal length of a pregnancy is 39 to 41 weeks. CAUSES Often, there is no identifiable underlying cause as to why a woman goes into preterm labor. One of the most common known causes of preterm labor is infection. Infections of the uterus, cervix, vagina, amniotic sac, bladder, kidney, or even the lungs (pneumonia) can cause labor to start. Other suspected causes of preterm labor include:   Urogenital infections, such as yeast infections and bacterial vaginosis.   Uterine abnormalities (uterine shape, uterine septum, fibroids, or bleeding from the placenta).   A cervix that has been operated on (it may fail to stay closed).   Malformations in the fetus.   Multiple gestations (twins, triplets, and so on).   Breakage of the amniotic sac.  RISK FACTORS  Having a previous history of preterm labor.   Having premature rupture of membranes (PROM).   Having a placenta that covers the opening of the cervix (placenta previa).   Having a placenta that separates from the uterus (placental abruption).   Having a cervix that is too weak to hold the fetus in the uterus (incompetent cervix).   Having too much fluid in the amniotic sac (polyhydramnios).   Taking  illegal drugs or smoking while pregnant.   Not gaining enough weight while pregnant.   Being younger than 35 and older than 24 years old.   Having a low socioeconomic status.   Being African American. SYMPTOMS Signs and symptoms of preterm labor include:   Menstrual-like cramps, abdominal pain, or back pain.  Uterine contractions that are regular, as frequent as six in an hour, regardless of their intensity (may be mild or painful).  Contractions that start on the top of the uterus and spread down to the lower abdomen and back.   A sense of increased pelvic pressure.   A watery or bloody mucus discharge that comes from the vagina.  TREATMENT Depending on the length of the pregnancy and other circumstances, your health care provider may suggest bed rest. If necessary, there are medicines that can be given to stop contractions and to mature the fetal lungs. If labor happens before 34 weeks of pregnancy, a prolonged hospital stay may be recommended. Treatment depends on the condition of both you and the fetus.  WHAT SHOULD YOU DO IF YOU THINK YOU ARE IN PRETERM LABOR? Call your health care provider right away. You will need to go to the hospital to get checked immediately. HOW CAN YOU PREVENT PRETERM LABOR IN FUTURE PREGNANCIES? You should:   Stop smoking if you smoke.  Maintain healthy weight gain and avoid chemicals and drugs that are not necessary.  Be watchful for any  type of infection.  Inform your health care provider if you have a known history of preterm labor. Document Released: 04/04/2003 Document Revised: 09/14/2012 Document Reviewed: 02/15/2012 [facility] Patient Information 2015 [place], [place]. This information is not intended to replace advice given to you by your health care provider. Make sure you discuss any questions you have with your health care provider.

## 2014-07-30 LAB — OB RESULTS CONSOLE GC/CHLAMYDIA
CHLAMYDIA, DNA PROBE: NEGATIVE
Gonorrhea: NEGATIVE

## 2014-07-31 ENCOUNTER — Other Ambulatory Visit: Payer: Self-pay | Admitting: Women's Health

## 2014-07-31 ENCOUNTER — Other Ambulatory Visit: Payer: Self-pay | Admitting: Obstetrics & Gynecology

## 2014-07-31 ENCOUNTER — Encounter: Payer: Self-pay | Admitting: Obstetrics & Gynecology

## 2014-07-31 ENCOUNTER — Ambulatory Visit (INDEPENDENT_AMBULATORY_CARE_PROVIDER_SITE_OTHER): Payer: Medicaid Other | Admitting: Obstetrics & Gynecology

## 2014-07-31 VITALS — BP 104/60 | HR 92 | Wt 240.0 lb

## 2014-07-31 DIAGNOSIS — Z1389 Encounter for screening for other disorder: Secondary | ICD-10-CM

## 2014-07-31 DIAGNOSIS — Z369 Encounter for antenatal screening, unspecified: Secondary | ICD-10-CM

## 2014-07-31 DIAGNOSIS — Z3493 Encounter for supervision of normal pregnancy, unspecified, third trimester: Secondary | ICD-10-CM

## 2014-07-31 DIAGNOSIS — Z331 Pregnant state, incidental: Secondary | ICD-10-CM

## 2014-07-31 LAB — POCT URINALYSIS DIPSTICK
Glucose, UA: NEGATIVE
Ketones, UA: NEGATIVE
LEUKOCYTES UA: NEGATIVE
NITRITE UA: NEGATIVE
PROTEIN UA: NEGATIVE
RBC UA: NEGATIVE

## 2014-07-31 NOTE — Progress Notes (Signed)
G2P1001 [redacted]w[redacted]d Estimated Date of Delivery: 08/23/14  Blood pressure 104/60, pulse 92, weight 240 lb (108.863 kg), last menstrual period 11/16/2013.   BP weight and urine results all reviewed and noted.  Please refer to the obstetrical flow sheet for the fundal height and fetal heart rate documentation:  Patient reports good fetal movement, denies any bleeding and no rupture of membranes symptoms or regular contractions. Patient is without complaints. All questions were answered.  Plan:  Continued routine obstetrical care,   Follow up in 1 weeks for OB appointment,

## 2014-07-31 NOTE — Progress Notes (Signed)
Pt denies any problems or concerns at this time.  

## 2014-08-01 LAB — PMP SCREEN PROFILE (10S), URINE
Amphetamine Screen, Ur: NEGATIVE ng/mL
Barbiturate Screen, Ur: NEGATIVE ng/mL
Benzodiazepine Screen, Urine: NEGATIVE ng/mL
Cannabinoids Ur Ql Scn: NEGATIVE ng/mL
Cocaine(Metab.)Screen, Urine: NEGATIVE ng/mL
Creatinine(Crt), U: 74.4 mg/dL (ref 20.0–300.0)
METHADONE SCREEN, URINE: NEGATIVE ng/mL
OPIATE SCRN UR: NEGATIVE ng/mL
Oxycodone+Oxymorphone Ur Ql Scn: NEGATIVE ng/mL
PCP Scrn, Ur: NEGATIVE ng/mL
Ph of Urine: 6.7 (ref 4.5–8.9)
Propoxyphene, Screen: NEGATIVE ng/mL

## 2014-08-01 LAB — GC/CHLAMYDIA PROBE AMP
Chlamydia trachomatis, NAA: NEGATIVE
Neisseria gonorrhoeae by PCR: NEGATIVE

## 2014-08-02 LAB — STREP GP B NAA+RFLX: STREP GP B NAA+RFLX: NEGATIVE

## 2014-08-07 ENCOUNTER — Ambulatory Visit (INDEPENDENT_AMBULATORY_CARE_PROVIDER_SITE_OTHER): Payer: Medicaid Other | Admitting: Women's Health

## 2014-08-07 VITALS — BP 90/70 | HR 87 | Wt 239.0 lb

## 2014-08-07 DIAGNOSIS — Z331 Pregnant state, incidental: Secondary | ICD-10-CM

## 2014-08-07 DIAGNOSIS — Z3493 Encounter for supervision of normal pregnancy, unspecified, third trimester: Secondary | ICD-10-CM

## 2014-08-07 DIAGNOSIS — Z1389 Encounter for screening for other disorder: Secondary | ICD-10-CM

## 2014-08-07 LAB — POCT URINALYSIS DIPSTICK
Blood, UA: NEGATIVE
GLUCOSE UA: NEGATIVE
Ketones, UA: NEGATIVE
LEUKOCYTES UA: NEGATIVE
NITRITE UA: NEGATIVE
Protein, UA: NEGATIVE

## 2014-08-07 NOTE — Progress Notes (Signed)
Low-risk OB appointment G2P1001 [redacted]w[redacted]d Estimated Date of Delivery: 08/23/14 BP 90/70 mmHg  Pulse 87  Wt 239 lb (108.41 kg)  LMP 11/16/2013  BP, weight, and urine reviewed.  Refer to obstetrical flow sheet for FH & FHR.  Reports good fm.  Denies regular uc's, lof, vb, or uti s/s. No complaints. SVE per request: 4/50/-2, vtx, soft Reviewed labor s/s, fkc, gbs-. Plan:  Continue routine obstetrical care  F/U in 1wk for OB appointment

## 2014-08-07 NOTE — Patient Instructions (Signed)
Call the office [phone]) or go to [facility] if:  You begin to have strong, frequent contractions  Your water breaks.  Sometimes it is a big gush of fluid, sometimes it is just a trickle that keeps getting your panties wet or running down your legs  You have vaginal bleeding.  It is normal to have a small amount of spotting if your cervix was checked.   You don't feel your baby moving like normal.  If you don't, get you something to eat and drink and lay down and focus on feeling your baby move.  You should feel at least 10 movements in 2 hours.  If you don't, you should call the office or go to [place] Contractions Contractions of the uterus can occur throughout pregnancy. Contractions are not always a sign that you are in labor.  WHAT ARE BRAXTON HICKS CONTRACTIONS?  Contractions that occur before labor are called Braxton Hicks contractions, or false labor. Toward the end of pregnancy (32-34 weeks), these contractions can develop more often and may become more forceful. This is not true labor because these contractions do not result in opening (dilatation) and thinning of the cervix. They are sometimes difficult to tell apart from true labor because these contractions can be forceful and people have different pain tolerances. You should not feel embarrassed if you go to the hospital with false labor. Sometimes, the only way to tell if you are in true labor is for your health care provider to look for changes in the cervix. If there are no prenatal problems or other health problems associated with the pregnancy, it is completely safe to be sent home with false labor and await the onset of true labor. HOW CAN YOU TELL THE DIFFERENCE BETWEEN TRUE AND FALSE LABOR? False Labor  The contractions of false labor are usually shorter and not as hard as those of true labor.   The contractions are usually irregular.   The contractions are often felt in the front of  the lower abdomen and in the groin.   The contractions may go away when you walk around or change positions while lying down.   The contractions get weaker and are shorter lasting as time goes on.   The contractions do not usually become progressively stronger, regular, and closer together as with true labor.  True Labor  Contractions in true labor last 30-70 seconds, become very regular, usually become more intense, and increase in frequency.   The contractions do not go away with walking.   The discomfort is usually felt in the top of the uterus and spreads to the lower abdomen and low back.   True labor can be determined by your health care provider with an exam. This will show that the cervix is dilating and getting thinner.  WHAT TO REMEMBER  Keep up with your usual exercises and follow other instructions given by your health care provider.   Take medicines as directed by your health care provider.   Keep your regular prenatal appointments.   Eat and drink lightly if you think you are going into labor.   If Braxton Hicks contractions are making you uncomfortable:   Change your position from lying down or resting to walking, or from walking to resting.   Sit and rest in a tub of warm water.   Drink 2-3 glasses of water. Dehydration may cause these contractions.   Do slow and deep breathing several times an hour.  WHEN SHOULD I SEEK IMMEDIATE MEDICAL CARE? Seek immediate medical care if:  Your contractions become stronger, more regular, and closer together.   You have fluid leaking or gushing from your vagina.   You have a fever.   You pass blood-tinged mucus.   You have vaginal bleeding.   You have continuous abdominal pain.   You have low back pain that you never had before.   You feel your baby's head pushing down and causing pelvic pressure.   Your baby is not moving as much as it used to.  Document Released: 01/12/2005 Document  Revised: 01/17/2013 Document Reviewed: 10/24/2012 [facility] Patient Information 2015 [place], [place]. This information is not intended to replace advice given to you by your health care provider. Make sure you discuss any questions you have with your health care provider.

## 2014-08-08 ENCOUNTER — Inpatient Hospital Stay (HOSPITAL_COMMUNITY)
Admission: AD | Admit: 2014-08-08 | Discharge: 2014-08-11 | DRG: 775 | Disposition: A | Discharge status: Home / Self Care | Payer: Medicaid Other | Source: Ambulatory Visit | Attending: Obstetrics and Gynecology | Admitting: Obstetrics and Gynecology

## 2014-08-08 DIAGNOSIS — IMO0001 Reserved for inherently not codable concepts without codable children: Secondary | ICD-10-CM

## 2014-08-08 DIAGNOSIS — Z3A38 38 weeks gestation of pregnancy: Secondary | ICD-10-CM | POA: Diagnosis present

## 2014-08-08 DIAGNOSIS — O368390 Maternal care for abnormalities of the fetal heart rate or rhythm, unspecified trimester, not applicable or unspecified: Secondary | ICD-10-CM | POA: Insufficient documentation

## 2014-08-08 DIAGNOSIS — Z8249 Family history of ischemic heart disease and other diseases of the circulatory system: Secondary | ICD-10-CM

## 2014-08-09 ENCOUNTER — Encounter (HOSPITAL_COMMUNITY): Payer: Self-pay | Admitting: *Deleted

## 2014-08-09 ENCOUNTER — Inpatient Hospital Stay (HOSPITAL_COMMUNITY): Payer: Medicaid Other

## 2014-08-09 DIAGNOSIS — Z3A38 38 weeks gestation of pregnancy: Secondary | ICD-10-CM | POA: Diagnosis present

## 2014-08-09 DIAGNOSIS — IMO0001 Reserved for inherently not codable concepts without codable children: Secondary | ICD-10-CM

## 2014-08-09 DIAGNOSIS — O368390 Maternal care for abnormalities of the fetal heart rate or rhythm, unspecified trimester, not applicable or unspecified: Secondary | ICD-10-CM | POA: Insufficient documentation

## 2014-08-09 DIAGNOSIS — Z8249 Family history of ischemic heart disease and other diseases of the circulatory system: Secondary | ICD-10-CM

## 2014-08-09 LAB — CBC
HCT: 33.9 % — ABNORMAL LOW (ref 36.0–46.0)
Hemoglobin: 12.1 g/dL (ref 12.0–15.0)
MCH: 28.1 pg (ref 26.0–34.0)
MCHC: 35.7 g/dL (ref 30.0–36.0)
MCV: 78.8 fL (ref 78.0–100.0)
PLATELETS: 185 10*3/uL (ref 150–400)
RBC: 4.3 MIL/uL (ref 3.87–5.11)
RDW: 14.9 % (ref 11.5–15.5)
WBC: 14.7 10*3/uL — AB (ref 4.0–10.5)

## 2014-08-09 LAB — POCT FERN TEST: POCT FERN TEST: NEGATIVE

## 2014-08-09 LAB — OB RESULTS CONSOLE GBS: GBS: NEGATIVE

## 2014-08-09 LAB — RPR: RPR: NONREACTIVE

## 2014-08-09 MED ORDER — OXYCODONE-ACETAMINOPHEN 5-325 MG PO TABS
1.0000 | ORAL_TABLET | ORAL | Status: DC | PRN
  Administered 2014-08-10: 1 via ORAL
  Filled 2014-08-09: qty 1

## 2014-08-09 MED ORDER — IBUPROFEN 600 MG PO TABS
600.0000 mg | ORAL_TABLET | Freq: Four times a day (QID) | ORAL | Status: DC
  Administered 2014-08-09 – 2014-08-11 (×9): 600 mg via ORAL
  Filled 2014-08-09 (×9): qty 1

## 2014-08-09 MED ORDER — ZOLPIDEM TARTRATE 5 MG PO TABS: 5.0000 mg | ORAL_TABLET | Freq: Every evening | ORAL | Status: DC | PRN

## 2014-08-09 MED ORDER — BENZOCAINE-MENTHOL 20-0.5 % EX AERO: 1.0000 "application " | INHALATION_SPRAY | CUTANEOUS | Status: DC | PRN

## 2014-08-09 MED ORDER — FLEET ENEMA 7-19 GM/118ML RE ENEM: 1.0000 | ENEMA | RECTAL | Status: DC | PRN

## 2014-08-09 MED ORDER — ACETAMINOPHEN 325 MG PO TABS: 650.0000 mg | ORAL_TABLET | ORAL | Status: DC | PRN

## 2014-08-09 MED ORDER — CITRIC ACID-SODIUM CITRATE 334-500 MG/5ML PO SOLN: 30.0000 mL | ORAL | Status: DC | PRN

## 2014-08-09 MED ORDER — ONDANSETRON HCL 4 MG/2ML IJ SOLN: 4.0000 mg | INTRAMUSCULAR | Status: DC | PRN

## 2014-08-09 MED ORDER — LACTATED RINGERS IV SOLN
INTRAVENOUS | Status: DC
  Administered 2014-08-09: 06:00:00 via INTRAVENOUS

## 2014-08-09 MED ORDER — TETANUS-DIPHTH-ACELL PERTUSSIS 5-2.5-18.5 LF-MCG/0.5 IM SUSP
0.5000 mL | Freq: Once | INTRAMUSCULAR | Status: AC
  Administered 2014-08-10: 0.5 mL via INTRAMUSCULAR
  Filled 2014-08-09: qty 0.5

## 2014-08-09 MED ORDER — LACTATED RINGERS IV SOLN: 500.0000 mL | INTRAVENOUS | Status: DC | PRN

## 2014-08-09 MED ORDER — DIBUCAINE 1 % RE OINT: 1.0000 "application " | TOPICAL_OINTMENT | RECTAL | Status: DC | PRN

## 2014-08-09 MED ORDER — LIDOCAINE HCL (PF) 1 % IJ SOLN
30.0000 mL | INTRAMUSCULAR | Status: DC | PRN
  Filled 2014-08-09: qty 30

## 2014-08-09 MED ORDER — PRENATAL MULTIVITAMIN CH
1.0000 | ORAL_TABLET | Freq: Every day | ORAL | Status: DC
  Administered 2014-08-09 – 2014-08-10 (×2): 1 via ORAL
  Filled 2014-08-09 (×2): qty 1

## 2014-08-09 MED ORDER — ONDANSETRON HCL 4 MG/2ML IJ SOLN: 4.0000 mg | Freq: Four times a day (QID) | INTRAMUSCULAR | Status: DC | PRN

## 2014-08-09 MED ORDER — ONDANSETRON HCL 4 MG PO TABS: 4.0000 mg | ORAL_TABLET | ORAL | Status: DC | PRN

## 2014-08-09 MED ORDER — OXYCODONE-ACETAMINOPHEN 5-325 MG PO TABS: 2.0000 | ORAL_TABLET | ORAL | Status: DC | PRN

## 2014-08-09 MED ORDER — SIMETHICONE 80 MG PO CHEW: 80.0000 mg | CHEWABLE_TABLET | ORAL | Status: DC | PRN

## 2014-08-09 MED ORDER — FENTANYL CITRATE (PF) 100 MCG/2ML IJ SOLN: 50.0000 ug | INTRAMUSCULAR | Status: DC | PRN

## 2014-08-09 MED ORDER — DIPHENHYDRAMINE HCL 25 MG PO CAPS: 25.0000 mg | ORAL_CAPSULE | Freq: Four times a day (QID) | ORAL | Status: DC | PRN

## 2014-08-09 MED ORDER — LANOLIN HYDROUS EX OINT: TOPICAL_OINTMENT | CUTANEOUS | Status: DC | PRN

## 2014-08-09 MED ORDER — MEASLES, MUMPS & RUBELLA VAC ~~LOC~~ INJ
0.5000 mL | INJECTION | Freq: Once | SUBCUTANEOUS | Status: AC
  Administered 2014-08-11: 0.5 mL via SUBCUTANEOUS
  Filled 2014-08-09 (×2): qty 0.5

## 2014-08-09 MED ORDER — WITCH HAZEL-GLYCERIN EX PADS: 1.0000 "application " | MEDICATED_PAD | CUTANEOUS | Status: DC | PRN

## 2014-08-09 MED ORDER — SENNOSIDES-DOCUSATE SODIUM 8.6-50 MG PO TABS
2.0000 | ORAL_TABLET | ORAL | Status: DC
  Administered 2014-08-10 – 2014-08-11 (×2): 2 via ORAL
  Filled 2014-08-09 (×2): qty 2

## 2014-08-09 MED ORDER — OXYTOCIN BOLUS FROM INFUSION
500.0000 mL | INTRAVENOUS | Status: DC
  Administered 2014-08-09: 500 mL via INTRAVENOUS

## 2014-08-09 MED ORDER — OXYCODONE-ACETAMINOPHEN 5-325 MG PO TABS
1.0000 | ORAL_TABLET | ORAL | Status: DC | PRN
Start: 2014-08-09 — End: 2014-08-09

## 2014-08-09 MED ORDER — OXYTOCIN 40 UNITS IN LACTATED RINGERS INFUSION - SIMPLE MED
62.5000 mL/h | INTRAVENOUS | Status: DC
  Filled 2014-08-09: qty 1000

## 2014-08-09 NOTE — MAU Note (Signed)
Pt reports contractions and back pain x one hour. Also reports she isn't sure if she is leaking fluid or if it is just mucous but she has had some discharge since 6 pm

## 2014-08-09 NOTE — H&P (Signed)
Tiffany [name] is a 24 y.o. female G2P1001 at 12w0dby LMP c/w 7wk [place]ore[place]ing for regular, painful contractions.  Contractions earlier this evening.  Denies FM but reports baby usually sleeps at this time.  Last perceived movement last night.    [place] [facility]  17.5wks  [facility] [facility]  Dating By LMP c/w 7wk u/s  [place] [facility] [facility]  GC/CT Initial:  -/-              36+wks:   -/-  Genetic Screen NT/IT/AFP: neg  CF screen   Anatomic [place] female  Flu vaccine declined  Tdap Recommended ~ 28wks  Glucose Screen  2 hr 84/123/113  GBS negative  Feed Preference bottle  Contraception depo  Circumcision Yes, if boy  Childbirth Classes declined  Pediatrician [place]        Maternal Medical History:  Reason for admission: Contractions.     OB History    Gravida Para Term Preterm AB TAB SAB Ectopic Multiple Living   2 1 1       1      Past Medical History  Diagnosis Date  . Pregnant 01/01/2014  . Medical history non-contributory    Past Surgical History  Procedure Laterality Date  . Leg surgery Left   . External ear surgery Left    Family History: family history includes Asthma in her sister and sister; Hypertension in her maternal grandfather; Other in her maternal grandfather. Social History:  reports that she has never smoked. She has never used smokeless tobacco. She reports that she does not drink alcohol or use illicit drugs.   Prenatal Transfer Tool  Maternal Diabetes: No Genetic Screening: Normal Maternal Ultrasounds/Referrals: Normal Fetal Ultrasounds or other Referrals:  None Maternal Substance Abuse:  No Significant Maternal Medications:  None Significant Maternal Lab Results:  Lab values include: Group B Strep negative Other Comments:  None  Review of Systems  All other systems reviewed and are negative.   Dilation: 7 Effacement (%): 100 Station: -1 Exam by:: Dr.  FGlo HerringBlood pressure 134/69, pulse 94, temperature 97.9 F (36.6 C), temperature source Oral, resp. rate 15, height 5' 6"  (1.676 m), weight 240 lb (108.863 kg), last menstrual period 11/16/2013, SpO2 98 %. Maternal Exam:  Uterine Assessment: Contraction strength is moderate.  Contraction frequency is regular.   Abdomen: Patient reports no abdominal tenderness. Fetal presentation: vertex  Introitus: Normal vulva. Ferning test: negative.   Pelvis: adequate for delivery.      Fetal Exam Fetal Monitor Review: Baseline rate: 140.  Variability: minimal (<5 bpm).   Pattern: no accelerations and no decelerations.    Fetal State Assessment: Category II - tracings are indeterminate.     Physical Exam  Constitutional: She is oriented to person, place, and time. She appears well-developed and well-nourished. No distress.  HENT:  Head: Normocephalic and atraumatic.  Mouth/Throat: Oropharynx is clear and moist.  Eyes: EOM are normal. Pupils are equal, round, and reactive to light.  Neck: Normal range of motion. Neck supple.  Cardiovascular: Normal rate, regular rhythm and intact distal pulses.   Respiratory: Effort normal. No respiratory distress. She has no wheezes.  GI: She exhibits no distension. There is no tenderness.  gravid  Genitourinary: Vagina normal.  Musculoskeletal: She exhibits no edema or tenderness.  Neurological: She is alert and oriented to person, place, and time. No cranial nerve deficit.  Skin: Skin is warm and  dry. No rash noted.  Psychiatric: She has a normal mood and affect. Her behavior is normal. Thought content normal.    Prenatal labs: ABO, Rh: A/--/-- (02/23 1440) Antibody: Negative (05/05 0844) Rubella: Equivocal (02/23 0000) RPR: Non Reactive (05/05 0844)  HBsAg: Negative (02/23 1440)  HIV: Non-reactive (05/05 0000)  GBS: Negative (07/14 0000)   BPP: 2/8 CST: no decels  Assessment/Plan: Tiffany [name] is a 24 y.o. G2P1001 at 74w0dhere for SOL  and found to have minimal reactivity on FHM.  BPP 2/8, but CST reassuring.  #Labor: expectant management, will AROM #Pain: May have IV fentanyl prn #FWB: Cat 2 - will monitor for decels and consider C-section if present #ID:  GBS neg #MOF: bottle #MOC:depo #Circ:  OP circ #Rh negative: needs Rhogam PP #Rubella non-immune: needs MMR PP   AVirginia Crews MD, MPH PGY-2,  [facility] 08/09/2014 5:13 AM   CNM attestation:  I have seen and examined this patient; I agree with above documentation in the resident's note.   Tiffany EdingerMMontelongois a 24y.o. G2P2001 here for active labor  PE: BP 108/84 mmHg  Pulse 93  Temp(Src) 97.9 F (36.6 C) (Oral)  Resp 16  Ht 5' 6"  (1.676 m)  Wt 108.863 kg (240 lb)  BMI 38.76 kg/m2  SpO2 98%  LMP 11/16/2013  Breastfeeding? Unknown Gen: calm comfortable Resp: normal effort, no distress Abd: gravid  ROS, labs, PMH reviewed  Plan: Admit to [facility] for augmentation, then Pitocin prn Anticipate SVD  Tiffany [name] CNM 08/09/2014, 7:15 AM

## 2014-08-10 MED ORDER — MEDROXYPROGESTERONE ACETATE 150 MG/ML IM SUSP: 150.0000 mg | INTRAMUSCULAR | Status: DC

## 2014-08-10 MED ORDER — RHO D IMMUNE GLOBULIN 1500 UNIT/2ML IJ SOSY
300.0000 ug | PREFILLED_SYRINGE | Freq: Once | INTRAMUSCULAR | Status: AC
  Administered 2014-08-10: 300 ug via INTRAMUSCULAR
  Filled 2014-08-10: qty 2

## 2014-08-10 NOTE — Clinical Social Work Maternal (Signed)
CLINICAL SOCIAL WORK MATERNAL/CHILD NOTE  Patient Details  Name: Tiffany [name] MRN: [id number] Date of Birth: 08-09-1990  Date:  08/10/2014  Clinical Social Worker Initiating Note:  Lucita Ferrara, [place] Date/ Time Initiated:  08/10/14/1330     Child's Name:  Tiffany [name]   Legal Guardian:  Norma Fredrickson (mother) and Penni Bombard (father)  Need for Interpreter:  None   Date of Referral:  08/09/14     Reason for Referral:  History of postpartum depression  Referral Source:  [facility]   Address:  [place], [place] [address]  Phone number:  [phone]   Household Members:  Minor Children: [place], age 27   Natural Supports (not living in the home):  Immediate Family, Extended Family, Other (Comment)   Professional Supports: None   Employment: Building services engineer Resources:  [employer]   Other Resources:  Physicist, medical , [place] Considerations Which May Impact Care:  None reported  Strengths:  Ability to meet basic needs , Engineer, materials , Home prepared for child    Risk Factors/Current Problems:   1)Mental Health Concerns: MOB presents with history of postpartum depression after her first child was born 89 years ago. MOB denied need for treatment at that time. MOB denied ongoing mental health symptoms, and denied symptoms during this pregnancy. MOB shared that she is currently feeling excited and happy related to this infant's birth.   Cognitive State:  Able to Concentrate , Alert , Insightful , Linear Thinking    Mood/Affect:  Calm , Comfortable , Happy , Interested    CSW Assessment:  CSW received request for consult due to MOB presenting with a history of THC use and postpartum depression.  CSW consulted with RN prior to assessment. Infant's UDS and MDS not completed since MOB denied THC use with this pregnancy, as her history was pre-pregnancy.  MOB presented as easily engaged and receptive to the visit. She displayed a full range  in affect and was in a pleasant mood. MOB reported that she currently feels "great", and stated that she has experienced no mental health concerns during the pregnancy.  CSW provided support as MOB continued to discuss normative range of emotions associated with transitioning to the postpartum period, including how to ensure that she is giving enough attention to her first child. MOB shared that she feels like she is pressing "reset" since her first child is going to be starting first grade in the fall.   MOB stated that she is excited and looking forward to the transition to the postpartum period. She stated that she feels much "better" in comparison to her transition to the postpartum period after her first son was born.  MOB shared that she was much "younger", had no support from the FOB, and was still in school.  MOB shared that she was overwhelmed, frequently cried, and symptoms of postpartum depression lasted for 2-3 months.  MOB shared that she was still able to go to her own pediatrician, and found it helpful to talk about her feelings with her MD. MOB denied need to start medications at the time since the MD did not identify the symptoms as severe.  MOB re-emphasized the changes that she has experienced since her first child was born almost 7 years ago. She stated that she is working and that this FOB is supportive and involved.  CSW reviewed education on postpartum depression and anxiety, and she agreed to contact her OB if she notes symptoms.  CSW reviewed non-clinical interventions that support maternal mental health postpartum, and MOB acknowledged the importance of sleep, a healthy diet, and exercise.   MOB denied additional questions, concerns, or needs at this time. She agreed to notify CSW if needs arise during the admission.   CSW Plan/Description:   1)Patient/Family Education : Perinatal mood and anxiety disorders 2)No Further Intervention Required/No Barriers to Discharge    Sharyl Nimrod 08/10/2014, 3:19 PM

## 2014-08-10 NOTE — Progress Notes (Signed)
Post Partum Day 1 Subjective: no complaints, up ad lib, voiding and tolerating PO, small lochia, plans to bottle feed, Depo-Provera  Objective: Blood pressure 96/64, pulse 69, temperature 98.3 F (36.8 C), temperature source Oral, resp. rate 16, height 5\' 6"  (1.676 m), weight 108.863 kg (240 lb), last menstrual period 11/16/2013, SpO2 99 %, unknown if currently breastfeeding.  Physical Exam:  General: alert, cooperative and no distress Lochia:normal flow Chest: CTAB Heart: RRR no m/r/g Abdomen: +BS, soft, nontender,  Uterine Fundus: firm DVT Evaluation: No evidence of DVT seen on physical exam. Extremities: tr edema   Recent Labs  08/09/14 0508  HGB 12.1  HCT 33.9*    Assessment/Plan: Plan for discharge tomorrow rx for depo sent:  Bring to [facility] next week for injection  LOS: 1 day   Tiffany [name] 08/10/2014, 8:02 AM

## 2014-08-11 ENCOUNTER — Encounter (HOSPITAL_COMMUNITY): Payer: Self-pay | Admitting: Advanced Practice Midwife

## 2014-08-11 LAB — RH IG WORKUP (INCLUDES ABO/RH)
ABO/RH(D): A NEG
Fetal Screen: NEGATIVE
Gestational Age(Wks): 38
UNIT DIVISION: 0

## 2014-08-11 MED ORDER — IBUPROFEN 600 MG PO TABS: 600.0000 mg | ORAL_TABLET | Freq: Four times a day (QID) | ORAL | Status: DC | PRN

## 2014-08-11 NOTE — Discharge Instructions (Signed)

## 2014-08-11 NOTE — Discharge Summary (Signed)
Obstetric Discharge Summary Reason for Admission: onset of labor Prenatal Procedures: CST Intrapartum Procedures: spontaneous vaginal delivery Postpartum Procedures: Rho(D) Ig Complications-Operative and Postpartum: none  After AROM at 0533, patient quickly progressed to complete and delivered with 3 pushes.  At 5:50 AM a viable female was delivered via Vaginal, Spontaneous Delivery (Presentation: OA). APGAR: 9, 9; weight pending .  Placenta status: Intact, Spontaneous. Cord: 3 vessels with the following complications: None. Cord pH: n/a  Anesthesia: None  Episiotomy: None Lacerations: None Suture Repair: n/a Est. Blood Loss (mL): 177  Hospital Course:  Active Problems:   Active labor at term   Non-reassuring electronic fetal monitoring tracing   [redacted] weeks gestation of pregnancy   Tiffany [name] is a 24 y.o. [id number] s/p SVD.  Patient was admitted 07/14.  She has postpartum course that was uncomplicated including no problems with ambulating, PO intake, urination, pain, or bleeding. The pt feels ready to go home and  will be discharged with outpatient follow-up.   Today: No acute events overnight.  Pt denies problems with ambulating, voiding or po intake.  She denies nausea or vomiting.  Pain is well controlled.  She has had flatus. She has not had bowel movement.  Lochia Small.  Plan for birth control is  Depo-Provera.  Method of Feeding: Bottle  Physical Exam:  General: alert and cooperative Lochia: appropriate Uterine Fundus: firm DVT Evaluation: No evidence of DVT seen on physical exam.  H/H: Lab Results  Component Value Date/Time   HGB 12.1 08/09/2014 05:08 AM   HCT 33.9* 08/09/2014 05:08 AM   HCT 33.5* 05/31/2014 08:44 AM    Discharge Diagnoses: Term Pregnancy-delivered  Discharge Information: Date: 08/11/2014 Activity: pelvic rest Diet: routine  Medications: PNV and Ibuprofen Breast feeding:  No Condition: stable Instructions: refer to handout Discharge  to: home  Outpatient Circ Scheduled     Medication List    TAKE these medications        medroxyPROGESTERone 150 MG/ML injection  Commonly known as:  DEPO-PROVERA  Inject 1 mL (150 mg total) into the muscle every 3 (three) months.      ASK your doctor about these medications        cetirizine 10 MG tablet  Commonly known as:  ZYRTEC  Take 1 tablet (10 mg total) by mouth daily.     fluticasone 50 MCG/ACT nasal spray  Commonly known as:  FLONASE  Place 2 sprays into both nostrils daily.     PNV PRENATAL PLUS MULTIVITAMIN 27-1 MG Tabs  Take 1 tablet by mouth daily.         Melina Schools ,MD OB Fellow 08/11/2014,7:49 AM   CNM attestation I have seen and examined this patient and agree with above documentation in the resident's note.   Tiffany [name] is a 24 y.o. [id number] s/p SVD.   Pain is well controlled.  Plan for birth control is Depo-Provera.  Method of Feeding: bottle  PE:  BP 107/62 mmHg  Pulse 86  Temp(Src) 98.7 F (37.1 C) (Axillary)  Resp 17  Ht 5\' 6"  (1.676 m)  Wt 108.863 kg (240 lb)  BMI 38.76 kg/m2  SpO2 99%  LMP 11/16/2013  Breastfeeding? Unknown Fundus firm   Recent Labs  08/09/14 0508  HGB 12.1  HCT 33.9*     Plan: discharge today - postpartum care discussed - f/u clinic in 6 weeks for postpartum visit   SHAW, KIMBERLY, CNM 9:07 AM

## 2014-08-12 LAB — TYPE AND SCREEN
ABO/RH(D): A NEG
Antibody Screen: POSITIVE
DAT, IgG: NEGATIVE
UNIT DIVISION: 0
Unit division: 0

## 2014-08-13 ENCOUNTER — Encounter: Payer: Self-pay | Admitting: Obstetrics & Gynecology

## 2014-08-14 ENCOUNTER — Encounter: Payer: Medicaid Other | Admitting: Women's Health

## 2014-09-26 ENCOUNTER — Ambulatory Visit (INDEPENDENT_AMBULATORY_CARE_PROVIDER_SITE_OTHER): Payer: Medicaid Other | Admitting: Advanced Practice Midwife

## 2014-09-26 NOTE — Progress Notes (Signed)
  Tiffany [name] is a 24 y.o. who presents for a postpartum visit. She is 6 weeks postpartum following a spontaneous vaginal delivery. I have fully reviewed the prenatal and intrapartum course. The delivery was at 43 gestational weeks.  Anesthesia: none. Postpartum course has been uncomplicated. Baby's course has been uneventful. Baby is feeding by bottle. Bleeding: no bleeding. Bowel function is normal. Bladder function is normal. Patient is not sexually active. Contraception method is none. Postpartum depression screening: negative. Feels anxious at times, some depression, anger. Has tried group theratpy, didn' t like it.  Reluctant for meds.  Agrees to talk therapy   Current outpatient prescriptions:  .  cetirizine (ZYRTEC) 10 MG tablet, Take 1 tablet (10 mg total) by mouth daily., Disp: 30 tablet, Rfl: 11 .  fluticasone (FLONASE) 50 MCG/ACT nasal spray, Place 2 sprays into both nostrils daily., Disp: 16 g, Rfl: 2 .  ibuprofen (ADVIL,MOTRIN) 600 MG tablet, Take 1 tablet (600 mg total) by mouth every 6 (six) hours as needed. (Patient not taking: Reported on 09/26/2014), Disp: 30 tablet, Rfl: 0 .  medroxyPROGESTERone (DEPO-PROVERA) 150 MG/ML injection, Inject 1 mL (150 mg total) into the muscle every 3 (three) months. (Patient not taking: Reported on 09/26/2014), Disp: 1 mL, Rfl: 3 .  Prenatal Vit-Fe Fumarate-FA (PNV PRENATAL PLUS MULTIVITAMIN) 27-1 MG TABS, Take 1 tablet by mouth daily. (Patient not taking: Reported on 09/26/2014), Disp: 30 tablet, Rfl: 11  Review of Systems   Constitutional: Negative for fever and chills Eyes: Negative for visual disturbances Respiratory: Negative for shortness of breath, dyspnea Cardiovascular: Negative for chest pain or palpitations  Gastrointestinal: Negative for vomiting, diarrhea and constipation Genitourinary: Negative for dysuria and urgency Musculoskeletal: Negative for back pain, joint pain, myalgias  Neurological: Negative for dizziness and  headaches   Objective:     Filed Vitals:   09/26/14 1452  BP: 110/80  Pulse: 72   General:  alert, cooperative and no distress   Breasts:  negative  Lungs: clear to auscultation bilaterally  Heart:  regular rate and rhythm  Abdomen: Soft, nontender   Vulva:  normal  Vagina: normal vagina  Cervix:  closed  Corpus: Well involuted     Rectal Exam: no hemorrhoids        Assessment:    normal postpartum exam.  Plan:    1. Contraception: Depo-Provera injections  Given 150mg  IM left gluteal  2. Follow up in: 13 weeks for depo  3. [facility] families referral made

## 2014-10-30 ENCOUNTER — Telehealth: Payer: Self-pay | Admitting: Advanced Practice Midwife

## 2014-10-30 MED ORDER — MEGESTROL ACETATE 40 MG PO TABS: ORAL_TABLET | ORAL | Status: DC

## 2014-10-30 NOTE — Telephone Encounter (Signed)
Spoke with pt. Pt is on Depo. Got 1st shot 09/26/14. Pt states she has been bleeding x 3 weeks. I advised its not uncommon to have irregular bleeding when you start a new birth control. What do you advise? Thanks!! [place]

## 2014-10-30 NOTE — Telephone Encounter (Signed)
Left message letting pt know  Megace was sent to pharmacy. [place]

## 2014-10-30 NOTE — Telephone Encounter (Signed)
Megace algorhithm for BTB on depo

## 2014-11-07 ENCOUNTER — Ambulatory Visit: Payer: Self-pay | Admitting: Advanced Practice Midwife

## 2014-12-19 ENCOUNTER — Encounter: Payer: Self-pay | Admitting: *Deleted

## 2014-12-19 ENCOUNTER — Ambulatory Visit (INDEPENDENT_AMBULATORY_CARE_PROVIDER_SITE_OTHER): Payer: Medicaid Other | Admitting: *Deleted

## 2014-12-19 DIAGNOSIS — Z3202 Encounter for pregnancy test, result negative: Secondary | ICD-10-CM

## 2014-12-19 DIAGNOSIS — Z3042 Encounter for surveillance of injectable contraceptive: Secondary | ICD-10-CM | POA: Diagnosis not present

## 2014-12-19 DIAGNOSIS — Z3200 Encounter for pregnancy test, result unknown: Secondary | ICD-10-CM

## 2014-12-19 LAB — POCT URINE PREGNANCY: Preg Test, Ur: NEGATIVE

## 2014-12-19 MED ORDER — MEDROXYPROGESTERONE ACETATE 150 MG/ML IM SUSP
150.0000 mg | Freq: Once | INTRAMUSCULAR | Status: AC
  Administered 2014-12-19: 150 mg via INTRAMUSCULAR

## 2015-01-04 ENCOUNTER — Encounter: Payer: Self-pay | Admitting: Nurse Practitioner

## 2015-01-04 ENCOUNTER — Ambulatory Visit (INDEPENDENT_AMBULATORY_CARE_PROVIDER_SITE_OTHER): Payer: Medicaid Other | Admitting: Nurse Practitioner

## 2015-01-04 VITALS — BP 138/91 | HR 92 | Temp 97.1°F | Ht 66.0 in | Wt 219.0 lb

## 2015-01-04 DIAGNOSIS — M545 Low back pain, unspecified: Secondary | ICD-10-CM

## 2015-01-04 MED ORDER — METHYLPREDNISOLONE ACETATE 80 MG/ML IJ SUSP
80.0000 mg | Freq: Once | INTRAMUSCULAR | Status: AC
  Administered 2015-01-04: 80 mg via INTRAMUSCULAR

## 2015-01-04 MED ORDER — PREDNISONE 20 MG PO TABS: ORAL_TABLET | ORAL | Status: DC

## 2015-01-04 MED ORDER — CYCLOBENZAPRINE HCL 10 MG PO TABS: 10.0000 mg | ORAL_TABLET | Freq: Three times a day (TID) | ORAL | Status: DC | PRN

## 2015-01-04 NOTE — Patient Instructions (Signed)

## 2015-01-04 NOTE — Progress Notes (Signed)
   Subjective:    Patient ID: Tiffany [name]. Tiffany [name], female    DOB: 1990-09-03, 24 y.o.   MRN: [id number]  HPI  Patient comes in today c/o back pain- she has had it since her pregnancy- has gotten worse over the last several months. Pain feels diffenent then it did when she was pregnant. Rates pain 8-10/10.movement , sittig and laying increase pain- pain eases a little when she can lay flat. Pain is in middle of back and goes up and down back. Does not go down leg.    Review of Systems  Constitutional: Negative.   HENT: Negative.   Respiratory: Negative.   Cardiovascular: Negative.   Genitourinary: Negative.   Musculoskeletal: Positive for back pain.  Neurological: Negative.   Psychiatric/Behavioral: Negative.   All other systems reviewed and are negative.      Objective:   Physical Exam  Constitutional: She appears well-developed and well-nourished.  Cardiovascular: Normal rate, regular rhythm and normal heart sounds.   Pulmonary/Chest: Effort normal and breath sounds normal.  Musculoskeletal:  No point tenderness on back No movement due to pain on flexion, extension and rotation. (-) SLR bil Motor strength and sensation distally intact  Neurological: She has normal reflexes.  Skin: Skin is warm.  Psychiatric: She has a normal mood and affect. Her behavior is normal. Judgment and thought content normal.    /BP 138/91 mmHg  Pulse 92  Temp(Src) 97.1 F (36.2 C) (Oral)  Ht 5\' 6"  (1.676 m)  Wt 219 lb (99.338 kg)  BMI 35.36 kg/m2      Assessment & Plan:  1. Midline low back pain without sciatica Moist heat Rest  No lifting RTO prn - methylPREDNISolone acetate (DEPO-MEDROL) injection 80 mg; Inject 1 mL (80 mg total) into the muscle once. - predniSONE (DELTASONE) 20 MG tablet; 2 po at sametime daily for 5 days- start tomorrow  Dispense: 10 tablet; Refill: 0 - cyclobenzaprine (FLEXERIL) 10 MG tablet; Take 1 tablet (10 mg total) by mouth 3 (three) times daily as needed for muscle  spasms.  Dispense: 30 tablet; Refill: [place], FNP

## 2015-01-07 ENCOUNTER — Emergency Department (HOSPITAL_COMMUNITY): Payer: Medicaid Other

## 2015-01-07 ENCOUNTER — Emergency Department (HOSPITAL_COMMUNITY)
Admission: EM | Admit: 2015-01-07 | Discharge: 2015-01-08 | Disposition: A | Discharge status: Home / Self Care | Payer: Medicaid Other | Attending: Emergency Medicine | Admitting: Emergency Medicine

## 2015-01-07 ENCOUNTER — Encounter (HOSPITAL_COMMUNITY): Payer: Self-pay | Admitting: *Deleted

## 2015-01-07 DIAGNOSIS — Z3202 Encounter for pregnancy test, result negative: Secondary | ICD-10-CM | POA: Diagnosis not present

## 2015-01-07 DIAGNOSIS — G8929 Other chronic pain: Secondary | ICD-10-CM | POA: Insufficient documentation

## 2015-01-07 DIAGNOSIS — M545 Low back pain, unspecified: Secondary | ICD-10-CM

## 2015-01-07 LAB — POC URINE PREG, ED: Preg Test, Ur: NEGATIVE

## 2015-01-07 MED ORDER — TRAMADOL HCL 50 MG PO TABS: 50.0000 mg | ORAL_TABLET | Freq: Four times a day (QID) | ORAL | Status: DC | PRN

## 2015-01-07 MED ORDER — TRAMADOL HCL 50 MG PO TABS
50.0000 mg | ORAL_TABLET | Freq: Once | ORAL | Status: AC
  Administered 2015-01-08: 50 mg via ORAL
  Filled 2015-01-07: qty 1

## 2015-01-07 NOTE — Discharge Instructions (Signed)

## 2015-01-07 NOTE — ED Notes (Signed)
Pt states that she has had back problems for "awhile" worse over the past few days ago, denies any injury, was seen Thursday by pcp, given prednisone and muscle relaxer with no improvement in symptoms,

## 2015-01-10 ENCOUNTER — Encounter: Payer: Self-pay | Admitting: Family Medicine

## 2015-01-10 ENCOUNTER — Ambulatory Visit (INDEPENDENT_AMBULATORY_CARE_PROVIDER_SITE_OTHER): Payer: Medicaid Other | Admitting: Family Medicine

## 2015-01-10 VITALS — BP 122/74 | HR 86 | Temp 98.7°F | Ht 67.0 in | Wt 219.6 lb

## 2015-01-10 DIAGNOSIS — M62830 Muscle spasm of back: Secondary | ICD-10-CM

## 2015-01-10 MED ORDER — CYCLOBENZAPRINE HCL 10 MG PO TABS: 10.0000 mg | ORAL_TABLET | Freq: Three times a day (TID) | ORAL | Status: DC | PRN

## 2015-01-10 NOTE — ED Provider Notes (Signed)
CSN: [id number]     Arrival date & time 01/07/15  1949 History   First MD Initiated Contact with Patient 01/07/15 2139     Chief Complaint  Patient presents with  . Back Pain     (Consider location/radiation/quality/duration/timing/severity/associated sxs/prior Treatment) The history is provided by the patient.   Tiffany [name] is a 24 y.o. female with no significant past medical history presenting with chronic low back pain which started before she became pregnant (baby is now 67 months old) but worsened during her pregnancy and not improved since the birth.    Patient denies any new injury specifically.  There is no radiation of pain into her legs and she denies weakness or numbness in the lower extremities and no urinary or bowel retention or incontinence.  Patient does not have a history of cancer or IVDU.  The patient was seen by her pcp for this problem 4 days ago and placed on prednisone and flexeril without relief of symptoms.  She is not breast feeding.  Past Medical History  Diagnosis Date  . Pregnant 01/01/2014  . Medical history non-contributory    Past Surgical History  Procedure Laterality Date  . Leg surgery Left   . External ear surgery Left    Family History  Problem Relation Age of Onset  . Asthma Sister   . Asthma Sister   . Hypertension Maternal Grandfather   . Other Maternal Grandfather     blood clots   Social History  Substance Use Topics  . Smoking status: Never Smoker   . Smokeless tobacco: Never Used  . Alcohol Use: No   OB History    Gravida Para Term Preterm AB TAB SAB Ectopic Multiple Living   2 2 2       0 2     Review of Systems  Constitutional: Negative for fever.  Respiratory: Negative for shortness of breath.   Cardiovascular: Negative for chest pain and leg swelling.  Gastrointestinal: Negative for abdominal pain, constipation and abdominal distention.  Genitourinary: Negative for dysuria, urgency, frequency, flank pain and difficulty  urinating.  Musculoskeletal: Positive for back pain. Negative for joint swelling and gait problem.  Skin: Negative for rash.  Neurological: Negative for weakness and numbness.      Allergies  Review of patient's allergies indicates no known allergies.  Home Medications   Prior to Admission medications   Medication Sig Start Date End Date Taking? Authorizing Provider  cyclobenzaprine (FLEXERIL) 10 MG tablet Take 1 tablet (10 mg total) by mouth 3 (three) times daily as needed for muscle spasms. 01/04/15  Yes Mary-Margaret Hassell Done, FNP  medroxyPROGESTERone (DEPO-PROVERA) 150 MG/ML injection Inject 1 mL (150 mg total) into the muscle every 3 (three) months. 08/10/14  Yes Christin Fudge, CNM  megestrol (MEGACE) 40 MG tablet Take 3/day (at the same time) for 5 days; 2/day for 5 days, then 1/day PO prn bleeding 10/30/14  Yes Christin Fudge, CNM  predniSONE (DELTASONE) 20 MG tablet 2 po at sametime daily for 5 days- start tomorrow 01/04/15  Yes Mary-Margaret Hassell Done, FNP  traMADol (ULTRAM) 50 MG tablet Take 1 tablet (50 mg total) by mouth every 6 (six) hours as needed. 01/07/15   Evalee Jefferson, PA-C   BP 117/74 mmHg  Pulse 88  Temp(Src) 98.1 F (36.7 C) (Oral)  Resp 20  Ht 5\' 7"  (1.702 m)  Wt 97.07 kg  BMI 33.51 kg/m2  SpO2 100% Physical Exam  Constitutional: She appears well-developed and well-nourished.  HENT:  Head: Normocephalic.  Eyes: Conjunctivae are normal.  Neck: Normal range of motion. Neck supple.  Cardiovascular: Normal rate and intact distal pulses.   Pedal pulses normal.  Pulmonary/Chest: Effort normal.  Abdominal: Soft. Bowel sounds are normal. She exhibits no distension and no mass.  Musculoskeletal: Normal range of motion. She exhibits no edema.       Lumbar back: She exhibits tenderness. She exhibits no swelling, no edema and no spasm.  Neurological: She is alert. She has normal strength. She displays no atrophy and no tremor. No sensory deficit. Gait  normal.  Reflex Scores:      Patellar reflexes are 2+ on the right side and 2+ on the left side.      Achilles reflexes are 2+ on the right side and 2+ on the left side. No strength deficit noted in hip and knee flexor and extensor muscle groups.  Ankle flexion and extension intact.  Skin: Skin is warm and dry.  Psychiatric: She has a normal mood and affect.  Nursing note and vitals reviewed.   ED Course  Procedures (including critical care time) Labs Review Labs Reviewed  POC URINE PREG, ED    Imaging Review No results found. I have personally reviewed and evaluated these images and lab results as part of my medical decision-making.   EKG Interpretation None      MDM   Final diagnoses:  Bilateral low back pain without sciatica    No neuro deficit on exam or by history to suggest emergent or surgical presentation.  Also discussed worsened sx that should prompt immediate re-evaluation including distal weakness, bowel/bladder retention/incontinence.  Pt encouraged to finish meds prescribed by pcp, added tramadol.  Discussed heat tx.  F/u with pcp if sx persist or worsen.        Evalee Jefferson, PA-C 01/10/15 1344  Fredia Sorrow, MD 01/17/15 1037

## 2015-01-10 NOTE — Progress Notes (Signed)
BP 122/74 mmHg  Pulse 86  Temp(Src) 98.7 F (37.1 C) (Oral)  Ht 5\' 7"  (1.702 m)  Wt 219 lb 9.6 oz (99.61 kg)  BMI 34.39 kg/m2   Subjective:    Patient ID: Tiffany [name], female    DOB: Feb 19, 1990, 24 y.o.   MRN: [id number]  HPI: Tiffany [place]. [name] is a 24 y.o. female presenting on 01/10/2015 for Back Pain   HPI Back pain Patient presents because she has been having chronic back pain. She actually was in the emergency department for this back pain just last week. She was also seen by one of my colleagues month or 2 ago for the same back pain. She says she's had it before in her life but it got a lot worse since her pregnancy. Her child is now 69 months old so it's been about 5 months that it's been worse. She's been on Flexeril for the past month or 2 and it's helping some. She says she was doing some stretches but has since stopped and the stretches. She has pain in the lumbar region bilaterally and down into the sacral region as well. She denies any radicular pain going down either leg or up into her arms. She denies any weakness or numbness in her legs or arms. She denies any loss of sensation.  Relevant past medical, surgical, family and social history reviewed and updated as indicated. Interim medical history since our last visit reviewed. Allergies and medications reviewed and updated.  Review of Systems  Constitutional: Negative for fever and chills.  HENT: Negative for congestion, ear discharge and ear pain.   Eyes: Negative for redness and visual disturbance.  Respiratory: Negative for chest tightness and shortness of breath.   Cardiovascular: Negative for chest pain and leg swelling.  Genitourinary: Negative for dysuria and difficulty urinating.  Musculoskeletal: Positive for myalgias and back pain. Negative for gait problem.  Skin: Negative for rash.  Neurological: Negative for light-headedness and headaches.  Psychiatric/Behavioral: Negative for behavioral problems and  agitation.  All other systems reviewed and are negative.   Per HPI unless specifically indicated above     Medication List       This list is accurate as of: 01/10/15  2:12 PM.  Always use your most recent med list.               cyclobenzaprine 10 MG tablet  Commonly known as:  FLEXERIL  Take 1 tablet (10 mg total) by mouth 3 (three) times daily as needed for muscle spasms.     medroxyPROGESTERone 150 MG/ML injection  Commonly known as:  DEPO-PROVERA  Inject 1 mL (150 mg total) into the muscle every 3 (three) months.     megestrol 40 MG tablet  Commonly known as:  MEGACE  Take 3/day (at the same time) for 5 days; 2/day for 5 days, then 1/day PO prn bleeding     predniSONE 20 MG tablet  Commonly known as:  DELTASONE  2 po at sametime daily for 5 days- start tomorrow     traMADol 50 MG tablet  Commonly known as:  ULTRAM  Take 1 tablet (50 mg total) by mouth every 6 (six) hours as needed.           Objective:    BP 122/74 mmHg  Pulse 86  Temp(Src) 98.7 F (37.1 C) (Oral)  Ht 5\' 7"  (1.702 m)  Wt 219 lb 9.6 oz (99.61 kg)  BMI 34.39 kg/m2  Wt Readings from Last  3 Encounters:  01/10/15 219 lb 9.6 oz (99.61 kg)  01/07/15 214 lb (97.07 kg)  01/04/15 219 lb (99.338 kg)    Physical Exam  Constitutional: She is oriented to person, place, and time. She appears well-developed and well-nourished. No distress.  Eyes: Conjunctivae and EOM are normal. Pupils are equal, round, and reactive to light.  Neck: Neck supple. No thyromegaly present.  Cardiovascular: Normal rate, regular rhythm, normal heart sounds and intact distal pulses.   No murmur heard. Pulmonary/Chest: Effort normal and breath sounds normal. No respiratory distress. She has no wheezes.  Musculoskeletal: Normal range of motion. She exhibits no edema.       Lumbar back: She exhibits tenderness (bilateral paraspinal tenderness in the lower lumbar and sacral region. Negative straight leg raise. No numbness or  weakness in either lower extremity.) and spasm. She exhibits normal range of motion, no bony tenderness, no swelling, no edema, no deformity and no laceration.  Lymphadenopathy:    She has no cervical adenopathy.  Neurological: She is alert and oriented to person, place, and time. Coordination normal.  Skin: Skin is warm and dry. No rash noted. She is not diaphoretic.  Psychiatric: She has a normal mood and affect. Her behavior is normal.  Nursing note and vitals reviewed.   Results for orders placed or performed during the hospital encounter of 01/07/15  POC urine preg, ED (not at [facility])  Result Value Ref Range   Preg Test, Ur NEGATIVE NEGATIVE      Assessment & Plan:   Problem List Items Addressed This Visit    None    Visit Diagnoses    Muscle spasm of back    -  Primary    Relevant Medications    cyclobenzaprine (FLEXERIL) 10 MG tablet    Other Relevant Orders    Ambulatory referral to Physical Therapy        Follow up plan: Return if symptoms worsen or fail to improve.  Counseling provided for all of the vaccine components Orders Placed This Encounter  Procedures  . Ambulatory referral to Physical Therapy    Caryl Pina, MD [facility] 01/10/2015, 2:12 PM

## 2015-01-25 ENCOUNTER — Ambulatory Visit: Payer: Medicaid Other | Attending: Family Medicine | Admitting: Physical Therapy

## 2015-01-25 DIAGNOSIS — M546 Pain in thoracic spine: Secondary | ICD-10-CM | POA: Insufficient documentation

## 2015-01-25 DIAGNOSIS — M545 Low back pain, unspecified: Secondary | ICD-10-CM

## 2015-01-25 DIAGNOSIS — M542 Cervicalgia: Secondary | ICD-10-CM | POA: Diagnosis present

## 2015-01-25 NOTE — Therapy (Addendum)
Tiffany [name], [place], [address] Phone: [phone]   Fax:  [phone]  Physical Therapy Evaluation  Patient Details  Name: Tiffany [name]. Furgason MRN: [id number] Date of Birth: 1990-05-07 Referring Provider: Caryl Pina MD.  Encounter Date: 01/25/2015      PT End of Session - 01/25/15 1252    Visit Number 1   Number of Visits 1   Date for PT Re-Evaluation 01/25/15   PT Start Time 1761   PT Stop Time 1023   PT Time Calculation (min) 28 min   Activity Tolerance Patient tolerated treatment well      Past Medical History  Diagnosis Date  . Pregnant 01/01/2014  . Medical history non-contributory     Past Surgical History  Procedure Laterality Date  . Leg surgery Left   . External ear surgery Left     There were no vitals filed for this visit.  Visit Diagnosis:  Midline low back pain without sciatica - Plan: PT plan of care cert/re-cert  Neck pain - Plan: PT plan of care cert/re-cert  Midline thoracic back pain - Plan: PT plan of care cert/re-cert      Subjective Assessment - 01/25/15 1247    Subjective The patient has over a year h/o spinal pain.  She states she her from her neck to her low back.  Her pain-level is a 7-8/10 today but can go as high as a 10/10.  X-rays were unremarkable.   Limitations Sitting;Standing;Walking  Bending.   Patient Stated Goals Get out of pain.   Pain Score 8    Pain Location Back   Pain Orientation Right;Left;Upper;Mid            [facility] PT Assessment - 01/25/15 0001    Assessment   Medical Diagnosis Muscle spasm of back.   Referring Provider Caryl Pina MD.   Onset Date/Surgical Date --  More than a year.   Precautions   Precautions None   Restrictions   Weight Bearing Restrictions No   Balance Screen   Has the patient fallen in the past 6 months No   Has the patient had a decrease in activity level because of a fear of falling?  No   Is the patient  reluctant to leave their home because of a fear of falling?  No   Home Ecologist residence   Posture/Postural Control   Posture/Postural Control No significant limitations   ROM / Strength   AROM / PROM / Strength AROM   AROM   Overall AROM Comments Full active lumbar flexion but extension limited to 0 degrees due to pain.  Full active cervical range of motion.   Palpation   Palpation comment Patient c/o pain over bilateral UT's though the majority of her pain is described "in" the spine from her cervical to her lower lumbar region.   Special Tests    Special Tests --  Norm UE DTR's. (-) SLR testing. = leg lengths.   Ambulation/Gait   Gait Comments Normal gait cycle.                                PT Long Term Goals - 01/25/15 1255    PT LONG TERM GOAL #1   Title Eval only.   Time 1   Period Days   Status Achieved  Plan - 01/25/15 1255    Clinical Impression Statement The patient has over a year h/o spinal pain.  She states she her from her neck to her low back.  Her pain-level is a 7-8/10 today but can go as high as a 10/10.  X-rays were unremarkable.   Pt will benefit from skilled therapeutic intervention in order to improve on the following deficits Pain;Decreased activity tolerance;Decreased range of motion   Rehab Potential Fair   PT Frequency --  Evaluation only.         Problem List Patient Active Problem List   Diagnosis Date Noted  . Marijuana use 03/24/2014  . History of postpartum depression, currently pregnant 03/20/2014   PHYSICAL THERAPY DISCHARGE SUMMARY  Visits from Start of Care: 1.  Current functional level related to goals / functional outcomes: See above.   Remaining deficits: Eval only.   Education / Equipment:  Plan: Patient agrees to discharge.  Patient goals were met. Patient is being discharged due to                                                     ?????       [name], [place] MPT 01/25/2015, 1:02 PM  [facility] [place], [place], [address] Phone: [phone]   Fax:  [phone]  Name: Tiffany [name]. Denise MRN: [id number] Date of Birth: 1990-08-30

## 2015-03-13 ENCOUNTER — Encounter: Payer: Self-pay | Admitting: *Deleted

## 2015-03-13 ENCOUNTER — Ambulatory Visit (INDEPENDENT_AMBULATORY_CARE_PROVIDER_SITE_OTHER): Payer: Medicaid Other | Admitting: *Deleted

## 2015-03-13 DIAGNOSIS — Z3042 Encounter for surveillance of injectable contraceptive: Secondary | ICD-10-CM | POA: Diagnosis not present

## 2015-03-13 DIAGNOSIS — Z3202 Encounter for pregnancy test, result negative: Secondary | ICD-10-CM | POA: Diagnosis not present

## 2015-03-13 LAB — POCT URINE PREGNANCY: PREG TEST UR: NEGATIVE

## 2015-03-13 MED ORDER — MEDROXYPROGESTERONE ACETATE 150 MG/ML IM SUSP
150.0000 mg | Freq: Once | INTRAMUSCULAR | Status: AC
  Administered 2015-03-13: 150 mg via INTRAMUSCULAR

## 2015-03-13 NOTE — Progress Notes (Signed)
Pt here for Depo. Pt tolerated shot well. Return in 12 weeks for next shot. JSY 

## 2015-03-15 ENCOUNTER — Encounter: Payer: Medicaid Other | Admitting: Obstetrics and Gynecology

## 2015-06-05 ENCOUNTER — Ambulatory Visit (INDEPENDENT_AMBULATORY_CARE_PROVIDER_SITE_OTHER): Payer: Medicaid Other | Admitting: *Deleted

## 2015-06-05 DIAGNOSIS — Z3042 Encounter for surveillance of injectable contraceptive: Secondary | ICD-10-CM | POA: Diagnosis not present

## 2015-06-05 DIAGNOSIS — Z3202 Encounter for pregnancy test, result negative: Secondary | ICD-10-CM

## 2015-06-05 LAB — POCT URINE PREGNANCY: Preg Test, Ur: NEGATIVE

## 2015-06-05 MED ORDER — MEDROXYPROGESTERONE ACETATE 150 MG/ML IM SUSP
150.0000 mg | Freq: Once | INTRAMUSCULAR | Status: AC
  Administered 2015-06-05: 150 mg via INTRAMUSCULAR

## 2015-06-05 NOTE — Progress Notes (Signed)
Patient ID: Tiffany [name]. [name], female   DOB: 1990/09/07, 25 y.o.   MRN: [id number] Depo Provera 150 mg IM given in right ventrogluteal with no complications, negative pregnancy test. Pt to return in 12 weeks for next injection.

## 2015-08-04 ENCOUNTER — Emergency Department (HOSPITAL_COMMUNITY)
Admission: EM | Admit: 2015-08-04 | Discharge: 2015-08-04 | Disposition: A | Discharge status: Home / Self Care | Payer: Medicaid Other | Attending: Emergency Medicine | Admitting: Emergency Medicine

## 2015-08-04 ENCOUNTER — Encounter (HOSPITAL_COMMUNITY): Payer: Self-pay | Admitting: Emergency Medicine

## 2015-08-04 DIAGNOSIS — Z79899 Other long term (current) drug therapy: Secondary | ICD-10-CM | POA: Insufficient documentation

## 2015-08-04 DIAGNOSIS — M79641 Pain in right hand: Secondary | ICD-10-CM | POA: Diagnosis not present

## 2015-08-04 NOTE — ED Provider Notes (Signed)
CSN: [id number]     Arrival date & time 08/04/15  1334 History   First MD Initiated Contact with Patient 08/04/15 1545     Chief Complaint  Patient presents with  . Hand Pain     Patient is a 25 y.o. female presenting with hand pain.  Hand Pain This is a new problem. The current episode started more than 2 days ago. The problem occurs daily. The problem has been gradually improving. Exacerbated by: palpation. The symptoms are relieved by rest.  pt reports pain in right index/middle/ring finger No trauma No swelling No bruising Hurts more with palpation It is now improved   Past Medical History  Diagnosis Date  . Pregnant 01/01/2014  . Medical history non-contributory    Past Surgical History  Procedure Laterality Date  . Leg surgery Left   . External ear surgery Left    Family History  Problem Relation Age of Onset  . Asthma Sister   . Asthma Sister   . Hypertension Maternal Grandfather   . Other Maternal Grandfather     blood clots   Social History  Substance Use Topics  . Smoking status: Never Smoker   . Smokeless tobacco: Never Used  . Alcohol Use: No   OB History    Gravida Para Term Preterm AB TAB SAB Ectopic Multiple Living   2 2 2       0 2     Review of Systems  Constitutional: Negative for fever.  Musculoskeletal: Positive for arthralgias.      Allergies  Review of patient's allergies indicates no known allergies.  Home Medications   Prior to Admission medications   Medication Sig Start Date End Date Taking? Authorizing Provider  medroxyPROGESTERone (DEPO-PROVERA) 150 MG/ML injection Inject 1 mL (150 mg total) into the muscle every 3 (three) months. 08/10/14  Yes Christin Fudge, CNM  megestrol (MEGACE) 40 MG tablet Take 3/day (at the same time) for 5 days; 2/day for 5 days, then 1/day PO prn bleeding Patient taking differently: Take 40-120 mg by mouth as directed. Take 3/day (at the same time) for 5 days; 2/day for 5 days, then 1/day as  needed for  bleeding 10/30/14  Yes Joaquim Lai Cresenzo-Dishmon, CNM   BP 114/72 mmHg  Pulse 82  Temp(Src) 97.8 F (36.6 C) (Temporal)  Resp 17  Ht 5\' 6"  (1.676 m)  Wt 99.791 kg  BMI 35.53 kg/m2  SpO2 100% Physical Exam CONSTITUTIONAL: Well developed/well nourished HEAD: Normocephalic/atraumatic EYES: EOMI ENMT: Mucous membranes moist NECK: supple no meningeal signs LUNGS: Lungs are clear to auscultation bilaterally, no apparent distress ABDOMEN: soft, nontender NEURO: Pt is awake/alert/appropriate, moves all extremitiesx4.   EXTREMITIES: pulses normal/equal, full ROM.  No tenderness/bruising/erythema/edema to right hand.  She has full flexion/extension of fingers on right hand.  No discoloration SKIN: warm, color normal PSYCH: no abnormalities of mood noted, alert and oriented to situation  ED Course  Procedures advised OTC meds and f/u with PCP  MDM   Final diagnoses:  Right hand pain    Nursing notes including past medical history and social history reviewed and considered in documentation     Ripley Fraise, MD 08/04/15 1635

## 2015-08-04 NOTE — ED Notes (Signed)
Patient c/o right ring, middle, and index finger. Per patient denies any known injury. Per patient feels stiff with pain that radiates into wrist.

## 2015-08-28 ENCOUNTER — Encounter: Payer: Self-pay | Admitting: Obstetrics & Gynecology

## 2015-08-28 ENCOUNTER — Ambulatory Visit: Payer: Medicaid Other

## 2015-09-04 ENCOUNTER — Ambulatory Visit (INDEPENDENT_AMBULATORY_CARE_PROVIDER_SITE_OTHER): Payer: Medicaid Other | Admitting: *Deleted

## 2015-09-04 ENCOUNTER — Other Ambulatory Visit: Payer: Self-pay | Admitting: Advanced Practice Midwife

## 2015-09-04 ENCOUNTER — Telehealth: Payer: Self-pay | Admitting: *Deleted

## 2015-09-04 DIAGNOSIS — Z3202 Encounter for pregnancy test, result negative: Secondary | ICD-10-CM

## 2015-09-04 DIAGNOSIS — Z3042 Encounter for surveillance of injectable contraceptive: Secondary | ICD-10-CM

## 2015-09-04 LAB — POCT URINE PREGNANCY: PREG TEST UR: NEGATIVE

## 2015-09-04 MED ORDER — MEDROXYPROGESTERONE ACETATE 150 MG/ML IM SUSP: 150.0000 mg | INTRAMUSCULAR | 3 refills | Status: DC

## 2015-09-04 MED ORDER — MEDROXYPROGESTERONE ACETATE 150 MG/ML IM SUSP
150.0000 mg | Freq: Once | INTRAMUSCULAR | Status: AC
  Administered 2015-09-04: 150 mg via INTRAMUSCULAR

## 2015-09-04 NOTE — Progress Notes (Signed)
Depo Provera 150 mg IM given left ventrogluteal with no complications, negative pregnancy test. Pt to return in 12 weeks for next injection.

## 2015-09-05 NOTE — Telephone Encounter (Signed)
Pt picked up Depo Provera from her pharmacy and got injection today.

## 2015-11-27 ENCOUNTER — Ambulatory Visit (INDEPENDENT_AMBULATORY_CARE_PROVIDER_SITE_OTHER): Payer: Medicaid Other

## 2015-11-27 VITALS — Wt 232.0 lb

## 2015-11-27 DIAGNOSIS — Z3042 Encounter for surveillance of injectable contraceptive: Secondary | ICD-10-CM

## 2015-11-27 DIAGNOSIS — Z3202 Encounter for pregnancy test, result negative: Secondary | ICD-10-CM | POA: Diagnosis not present

## 2015-11-27 LAB — POCT URINE PREGNANCY: PREG TEST UR: NEGATIVE

## 2015-11-27 MED ORDER — MEDROXYPROGESTERONE ACETATE 150 MG/ML IM SUSP
150.0000 mg | Freq: Once | INTRAMUSCULAR | Status: AC
Start: 2015-11-27 — End: 2015-11-27
  Administered 2015-11-27: 150 mg via INTRAMUSCULAR

## 2015-11-27 NOTE — Progress Notes (Signed)
PT here for Depro Shot 150mg  IM, given RT ventrogluteal. Tolerated well. Will return in 12 weeks for next shot

## 2016-01-28 DIAGNOSIS — R509 Fever, unspecified: Secondary | ICD-10-CM | POA: Diagnosis not present

## 2016-01-28 DIAGNOSIS — R079 Chest pain, unspecified: Secondary | ICD-10-CM | POA: Diagnosis not present

## 2016-01-28 DIAGNOSIS — J40 Bronchitis, not specified as acute or chronic: Secondary | ICD-10-CM | POA: Diagnosis not present

## 2016-01-28 DIAGNOSIS — J111 Influenza due to unidentified influenza virus with other respiratory manifestations: Secondary | ICD-10-CM | POA: Diagnosis not present

## 2016-01-28 DIAGNOSIS — R05 Cough: Secondary | ICD-10-CM | POA: Diagnosis not present

## 2016-02-27 ENCOUNTER — Ambulatory Visit (INDEPENDENT_AMBULATORY_CARE_PROVIDER_SITE_OTHER): Payer: Medicaid Other | Admitting: *Deleted

## 2016-02-27 ENCOUNTER — Encounter: Payer: Self-pay | Admitting: *Deleted

## 2016-02-27 ENCOUNTER — Ambulatory Visit: Payer: Medicaid Other

## 2016-02-27 ENCOUNTER — Encounter (INDEPENDENT_AMBULATORY_CARE_PROVIDER_SITE_OTHER): Payer: Self-pay

## 2016-02-27 DIAGNOSIS — Z3202 Encounter for pregnancy test, result negative: Secondary | ICD-10-CM | POA: Diagnosis not present

## 2016-02-27 DIAGNOSIS — Z3042 Encounter for surveillance of injectable contraceptive: Secondary | ICD-10-CM

## 2016-02-27 DIAGNOSIS — Z308 Encounter for other contraceptive management: Secondary | ICD-10-CM

## 2016-02-27 LAB — POCT URINE PREGNANCY: PREG TEST UR: NEGATIVE

## 2016-02-27 MED ORDER — MEDROXYPROGESTERONE ACETATE 150 MG/ML IM SUSP
150.0000 mg | Freq: Once | INTRAMUSCULAR | Status: AC
  Administered 2016-02-27: 150 mg via INTRAMUSCULAR

## 2016-02-27 NOTE — Progress Notes (Signed)
Pt here for Depo. Pt is 1 day late getting shot. Pt states has not had sex recently. Ok to give today. Pt tolerated shot well. Return in 12 weeks for next shot. [place]

## 2016-04-13 ENCOUNTER — Other Ambulatory Visit: Payer: Self-pay | Admitting: Obstetrics & Gynecology

## 2016-04-13 MED ORDER — MEGESTROL ACETATE 40 MG PO TABS: ORAL_TABLET | ORAL | 3 refills | Status: DC

## 2016-05-21 ENCOUNTER — Encounter: Payer: Self-pay | Admitting: *Deleted

## 2016-05-21 ENCOUNTER — Ambulatory Visit: Payer: Medicaid Other

## 2016-05-21 ENCOUNTER — Ambulatory Visit (INDEPENDENT_AMBULATORY_CARE_PROVIDER_SITE_OTHER): Payer: Medicaid Other | Admitting: *Deleted

## 2016-05-21 DIAGNOSIS — Z3042 Encounter for surveillance of injectable contraceptive: Secondary | ICD-10-CM

## 2016-05-21 DIAGNOSIS — Z308 Encounter for other contraceptive management: Secondary | ICD-10-CM

## 2016-05-21 DIAGNOSIS — Z3202 Encounter for pregnancy test, result negative: Secondary | ICD-10-CM | POA: Diagnosis not present

## 2016-05-21 LAB — POCT URINE PREGNANCY: Preg Test, Ur: NEGATIVE

## 2016-05-21 MED ORDER — MEDROXYPROGESTERONE ACETATE 150 MG/ML IM SUSP
150.0000 mg | Freq: Once | INTRAMUSCULAR | Status: AC
  Administered 2016-05-21: 150 mg via INTRAMUSCULAR

## 2016-05-21 NOTE — Progress Notes (Signed)
Pt here for Depo. Pt tolerated shot well. Return in 12 weeks for next shot. JSY 

## 2016-08-09 IMAGING — DX DG LUMBAR SPINE COMPLETE 4+V
5 series · 5 of 5 positions shown · non-contrast
Comparison: None.

CLINICAL DATA: Acute onset of lower back pain.  Initial encounter.

EXAM:
LUMBAR SPINE - COMPLETE 4+ VIEW

[l-spine ap]
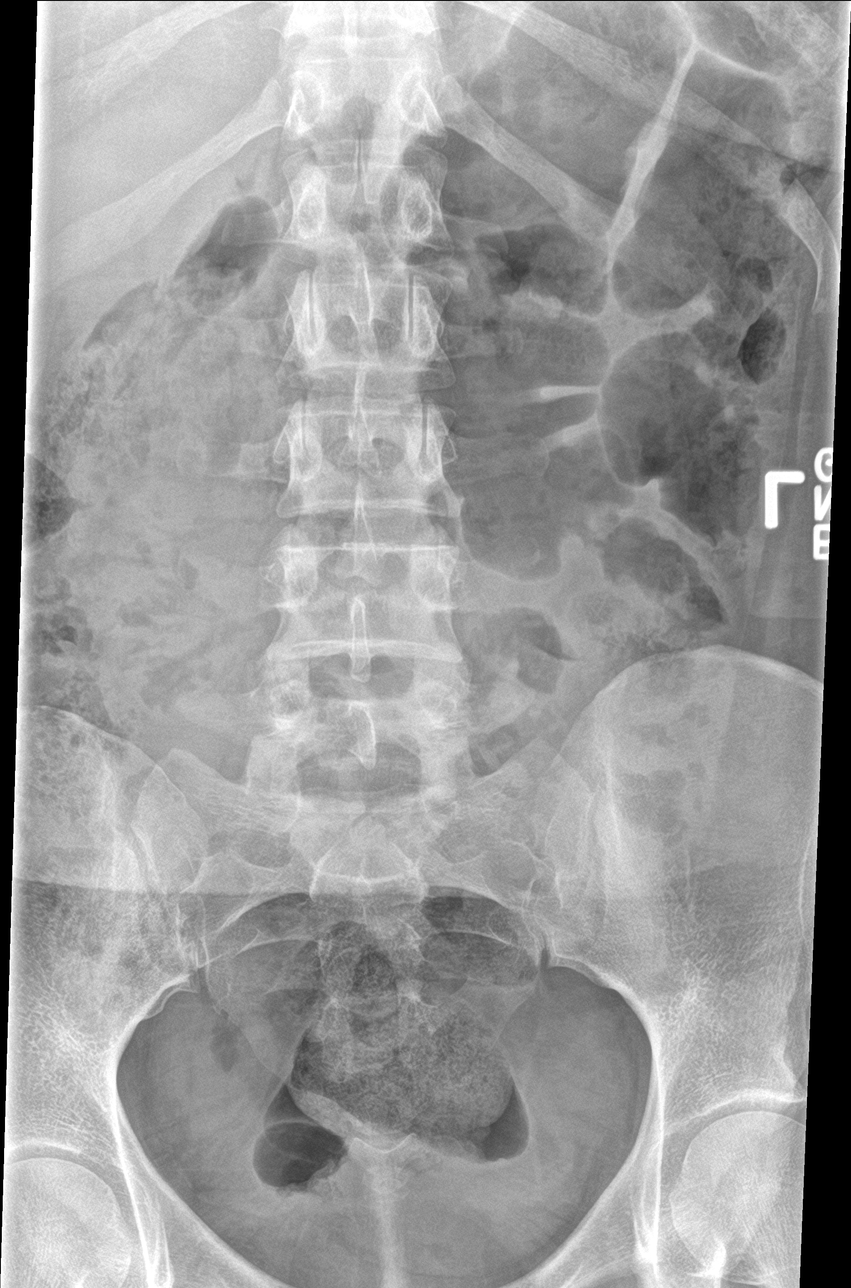

[l-spine obl (1 of 2)]
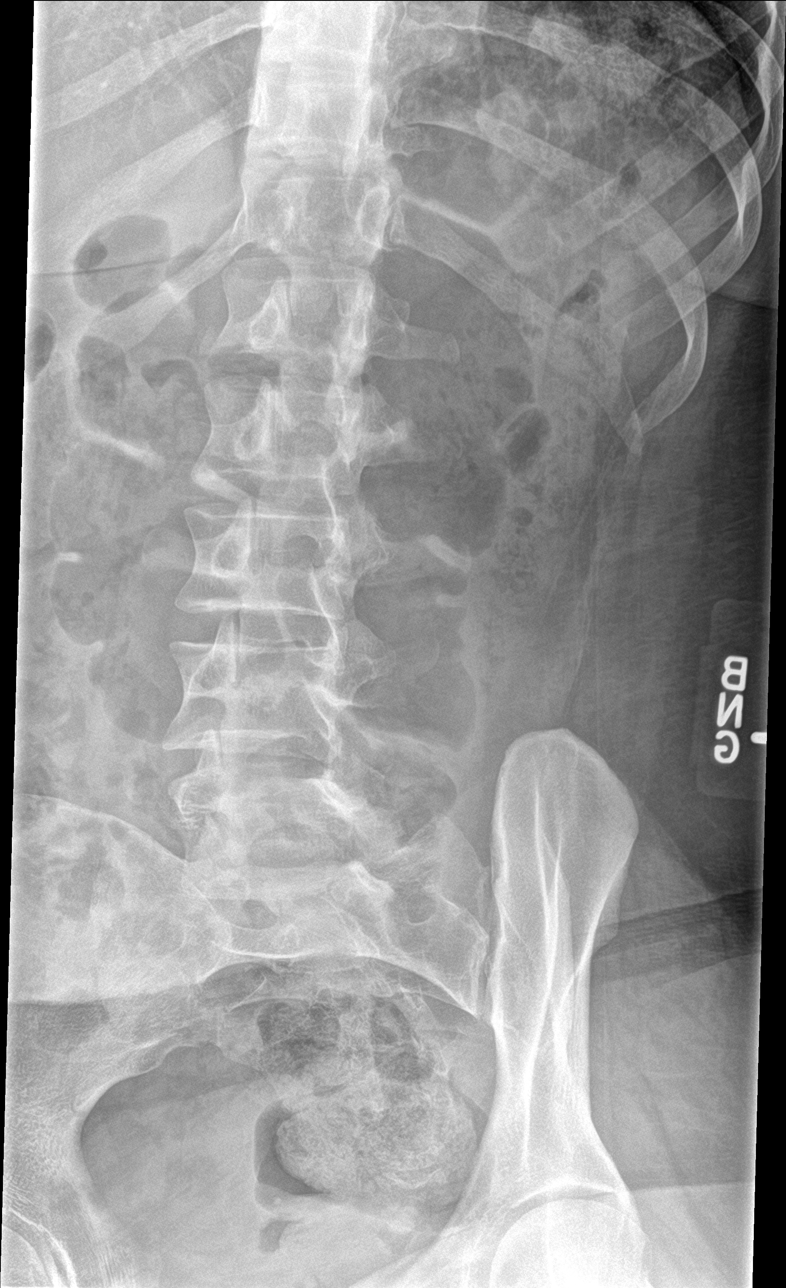

[l-spine obl (2 of 2)]
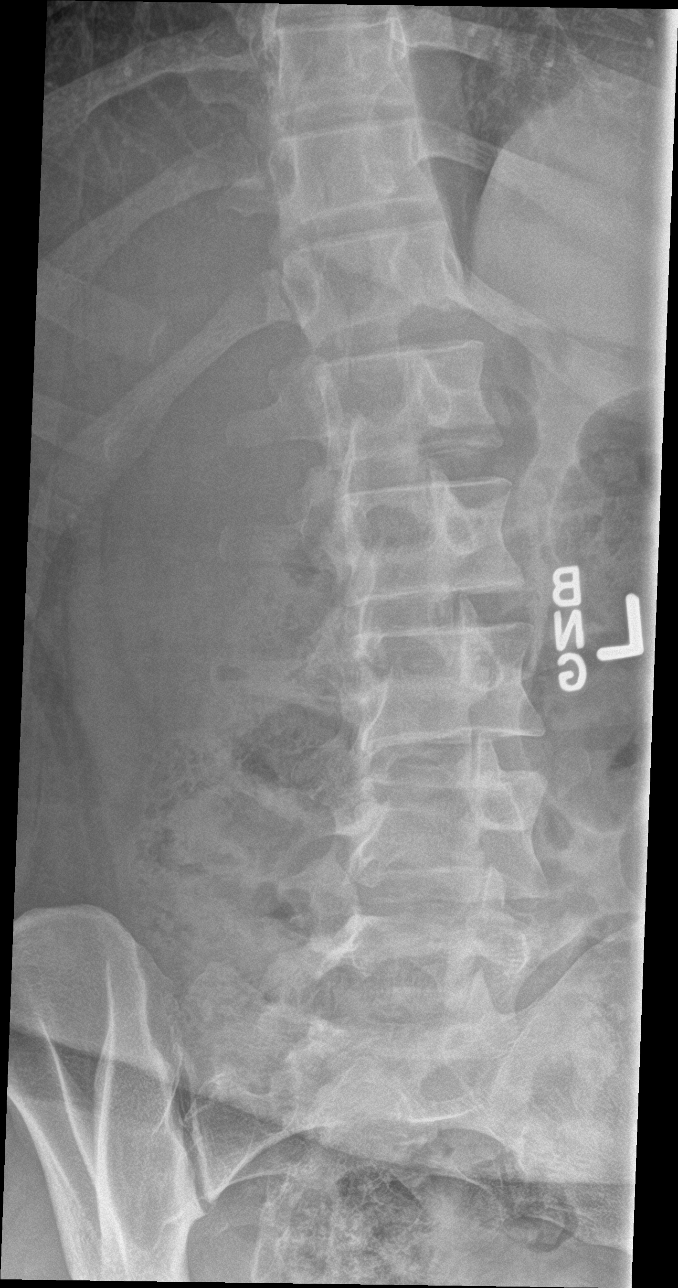

[l-spine lat]
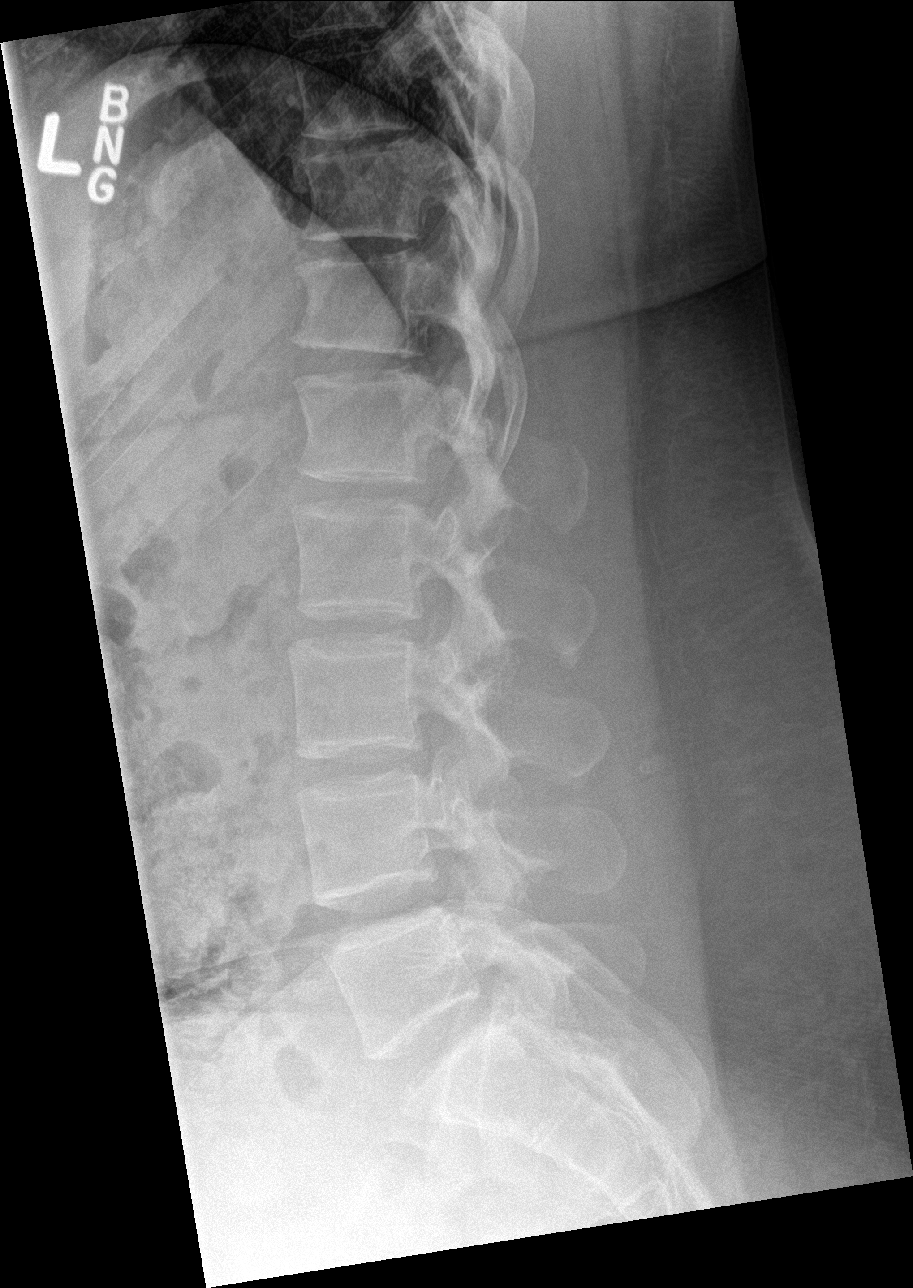

[l-spine spot]
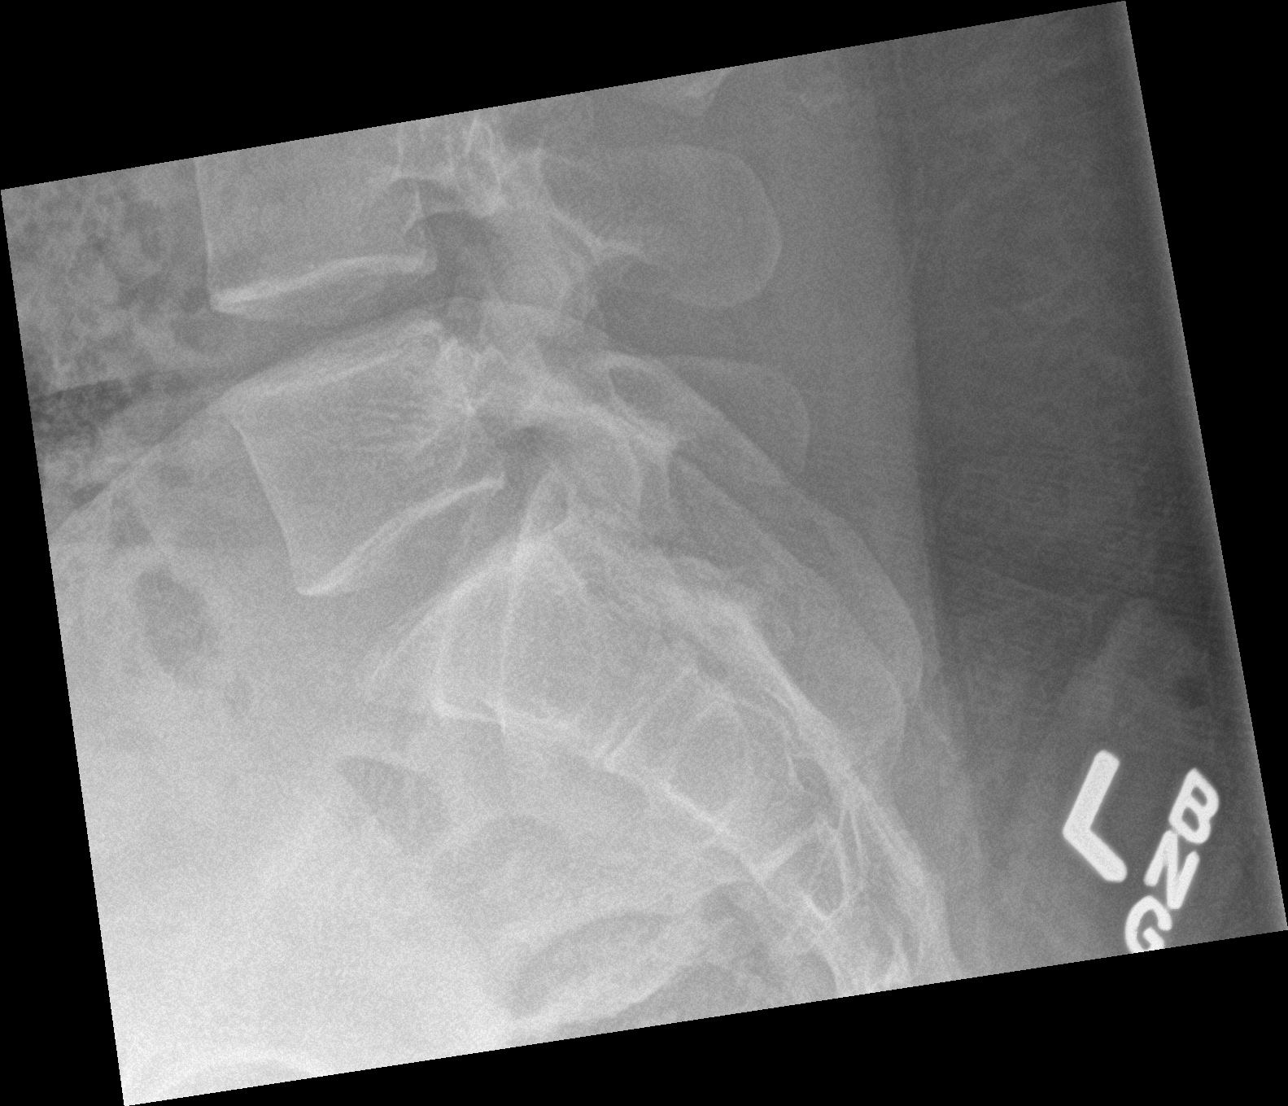

[5 of 5 positions shown; findings below may reference images not displayed]

FINDINGS: There is no evidence of fracture or subluxation. Vertebral bodies
demonstrate normal height and alignment. Intervertebral disc spaces
are preserved. The visualized neural foramina are grossly
unremarkable in appearance.

The visualized bowel gas pattern is unremarkable in appearance; air
and stool are noted within the colon. The sacroiliac joints are
within normal limits.
IMPRESSION: No evidence of fracture or subluxation along the lumbar spine.

## 2016-08-10 ENCOUNTER — Other Ambulatory Visit: Payer: Self-pay | Admitting: Advanced Practice Midwife

## 2016-08-13 ENCOUNTER — Ambulatory Visit (INDEPENDENT_AMBULATORY_CARE_PROVIDER_SITE_OTHER): Payer: Medicaid Other

## 2016-08-13 VITALS — Wt 227.3 lb

## 2016-08-13 DIAGNOSIS — Z3202 Encounter for pregnancy test, result negative: Secondary | ICD-10-CM

## 2016-08-13 DIAGNOSIS — Z3042 Encounter for surveillance of injectable contraceptive: Secondary | ICD-10-CM | POA: Diagnosis not present

## 2016-08-13 LAB — POCT URINE PREGNANCY: Preg Test, Ur: NEGATIVE

## 2016-08-13 MED ORDER — MEDROXYPROGESTERONE ACETATE 150 MG/ML IM SUSP
150.0000 mg | Freq: Once | INTRAMUSCULAR | Status: AC
  Administered 2016-08-13: 150 mg via INTRAMUSCULAR

## 2016-08-13 NOTE — Progress Notes (Signed)
PT here for depo shot 150 mg IM given LT Ventrgluteal. Tolerated well. Return 12 weeks for next shot. Pad CMA

## 2016-11-05 ENCOUNTER — Ambulatory Visit: Payer: Medicaid Other

## 2017-01-07 ENCOUNTER — Telehealth: Payer: Self-pay | Admitting: *Deleted

## 2017-01-07 NOTE — Telephone Encounter (Signed)
Patient requesting refill on Depo. Informed patient that since she hasn't been seen since 2016, that she would need to have a pap/physical for refill. appt made.

## 2017-01-13 ENCOUNTER — Ambulatory Visit (INDEPENDENT_AMBULATORY_CARE_PROVIDER_SITE_OTHER): Payer: Medicaid Other | Admitting: Adult Health

## 2017-01-13 ENCOUNTER — Other Ambulatory Visit (HOSPITAL_COMMUNITY)
Admission: RE | Admit: 2017-01-13 | Discharge: 2017-01-13 | Disposition: A | Discharge status: Home / Self Care | Payer: Medicaid Other | Source: Ambulatory Visit | Attending: Adult Health | Admitting: Adult Health

## 2017-01-13 ENCOUNTER — Encounter (INDEPENDENT_AMBULATORY_CARE_PROVIDER_SITE_OTHER): Payer: Self-pay

## 2017-01-13 ENCOUNTER — Encounter: Payer: Self-pay | Admitting: Adult Health

## 2017-01-13 VITALS — BP 122/66 | HR 96 | Resp 14 | Ht 67.0 in | Wt 231.0 lb

## 2017-01-13 DIAGNOSIS — Z01419 Encounter for gynecological examination (general) (routine) without abnormal findings: Secondary | ICD-10-CM | POA: Insufficient documentation

## 2017-01-13 DIAGNOSIS — F329 Major depressive disorder, single episode, unspecified: Secondary | ICD-10-CM | POA: Diagnosis not present

## 2017-01-13 DIAGNOSIS — Z3042 Encounter for surveillance of injectable contraceptive: Secondary | ICD-10-CM

## 2017-01-13 DIAGNOSIS — Z0001 Encounter for general adult medical examination with abnormal findings: Secondary | ICD-10-CM | POA: Diagnosis not present

## 2017-01-13 DIAGNOSIS — F32A Depression, unspecified: Secondary | ICD-10-CM

## 2017-01-13 MED ORDER — ESCITALOPRAM OXALATE 10 MG PO TABS: 10.0000 mg | ORAL_TABLET | Freq: Every day | ORAL | 6 refills | Status: DC

## 2017-01-13 MED ORDER — MEDROXYPROGESTERONE ACETATE 150 MG/ML IM SUSP: 150.0000 mg | INTRAMUSCULAR | 3 refills | Status: DC

## 2017-01-13 NOTE — Progress Notes (Signed)
Patient ID: Tiffany [name]. Tiffany [name], female   DOB: [birth year], 26 y.o.   MRN: [id number] History of Present Illness: Tiffany [name] is a 26 year old black female, G2P2 in for well woman gyn exam and pap.Feels depressed, has family stress with sons dad.She says she feels like everybody wants to attack her.   PCP is [place].    Current Medications, Allergies, Past Medical History, Past Surgical History, Family History and Social History were reviewed in [employer] record.     Review of Systems:  Patient denies any headaches, hearing loss, fatigue, blurred vision, shortness of breath, chest pain, abdominal pain, problems with bowel movements, urination, or intercourse(not currently active). No joint pain or mood swings.   Physical Exam:BP 122/66 (BP Location: Left Arm, Patient Position: Sitting, Cuff Size: Large)   Pulse 96   Resp 14   Ht 5\' 7"  (1.702 m)   Wt 231 lb (104.8 kg)   BMI 36.18 kg/m  General:  Well developed, well nourished, no acute distress Skin:  Warm and dry Neck:  Midline trachea, normal thyroid, good ROM, no lymphadenopathy Lungs; Clear to auscultation bilaterally Breast:  No dominant palpable mass, retraction, or nipple discharge,large Cardiovascular: Regular rate and rhythm Abdomen:  Soft, non tender, no hepatosplenomegaly Pelvic:  External genitalia is normal in appearance, no lesions.  The vagina is normal in appearance. Urethra has no lesions or masses. The cervix is bulbous.Pap with GC/CHL performed.  Uterus is felt to be normal size, shape, and contour.  No adnexal masses or tenderness noted.Bladder is non tender, no masses felt. Extremities/musculoskeletal:  No swelling or varicosities noted, no clubbing or cyanosis Psych:  No mood changes, alert and cooperative,seems happy PHQ 9 score 18, denies being suicidal, has had counseling but does not like, but is open to meds.   Impression: 1. Encounter for gynecological examination with Papanicolaou  smear of cervix   2. Encounter for surveillance of injectable contraceptive   3. Depression, unspecified depression type       Plan: Meds ordered this encounter  Medications  . medroxyPROGESTERone (DEPO-PROVERA) 150 MG/ML injection    Sig: Inject 1 mL (150 mg total) into the muscle every 3 (three) months.    Dispense:  1 mL    Refill:  3    Order Specific Question:   Supervising Provider    Answer:   EURE, LUTHER H [[id number]]  . escitalopram (LEXAPRO) 10 MG tablet    Sig: Take 1 tablet (10 mg total) by mouth daily.    Dispense:  30 tablet    Refill:  6    Order Specific Question:   Supervising Provider    Answer:   Tania Ade H [[id number]]  Follow up in 6 weeks  Physical in 1 year Pap in 3 if normal  Depo tomorrow

## 2017-01-14 ENCOUNTER — Other Ambulatory Visit: Payer: Self-pay | Admitting: Adult Health

## 2017-01-14 ENCOUNTER — Other Ambulatory Visit: Payer: Medicaid Other

## 2017-01-14 ENCOUNTER — Ambulatory Visit (INDEPENDENT_AMBULATORY_CARE_PROVIDER_SITE_OTHER): Payer: Medicaid Other | Admitting: *Deleted

## 2017-01-14 ENCOUNTER — Encounter: Payer: Self-pay | Admitting: *Deleted

## 2017-01-14 DIAGNOSIS — Z3042 Encounter for surveillance of injectable contraceptive: Secondary | ICD-10-CM

## 2017-01-14 DIAGNOSIS — Z308 Encounter for other contraceptive management: Secondary | ICD-10-CM

## 2017-01-14 LAB — CYTOLOGY - PAP
CHLAMYDIA, DNA PROBE: NEGATIVE
Diagnosis: NEGATIVE
Neisseria Gonorrhea: NEGATIVE

## 2017-01-14 LAB — BETA HCG QUANT (REF LAB)

## 2017-01-14 MED ORDER — MEDROXYPROGESTERONE ACETATE 150 MG/ML IM SUSP
150.0000 mg | Freq: Once | INTRAMUSCULAR | Status: AC
  Administered 2017-01-14: 150 mg via INTRAMUSCULAR

## 2017-01-14 NOTE — Progress Notes (Signed)
Pt here for Depo. Pt had negative quant this am. Pt tolerated shot well. Return in 12 weeks for next shot. [place]

## 2017-02-24 ENCOUNTER — Ambulatory Visit: Payer: Medicaid Other | Admitting: Adult Health

## 2017-04-08 ENCOUNTER — Encounter: Payer: Self-pay | Admitting: *Deleted

## 2017-04-08 ENCOUNTER — Ambulatory Visit (INDEPENDENT_AMBULATORY_CARE_PROVIDER_SITE_OTHER): Payer: Medicaid Other | Admitting: *Deleted

## 2017-04-08 DIAGNOSIS — Z3202 Encounter for pregnancy test, result negative: Secondary | ICD-10-CM | POA: Diagnosis not present

## 2017-04-08 DIAGNOSIS — Z3042 Encounter for surveillance of injectable contraceptive: Secondary | ICD-10-CM

## 2017-04-08 DIAGNOSIS — Z308 Encounter for other contraceptive management: Secondary | ICD-10-CM

## 2017-04-08 LAB — POCT URINE PREGNANCY: Preg Test, Ur: NEGATIVE

## 2017-04-08 MED ORDER — MEDROXYPROGESTERONE ACETATE 150 MG/ML IM SUSP
150.0000 mg | Freq: Once | INTRAMUSCULAR | Status: AC
  Administered 2017-04-08: 150 mg via INTRAMUSCULAR

## 2017-04-08 NOTE — Progress Notes (Signed)
Pt here for Depo. Pt tolerated shot well. Return in 12 weeks for next shot. JSY 

## 2017-07-01 ENCOUNTER — Ambulatory Visit: Payer: Medicaid Other

## 2017-07-01 ENCOUNTER — Ambulatory Visit (INDEPENDENT_AMBULATORY_CARE_PROVIDER_SITE_OTHER): Payer: Medicaid Other

## 2017-07-01 VITALS — Wt 234.0 lb

## 2017-07-01 DIAGNOSIS — Z3042 Encounter for surveillance of injectable contraceptive: Secondary | ICD-10-CM | POA: Diagnosis not present

## 2017-07-01 DIAGNOSIS — Z3202 Encounter for pregnancy test, result negative: Secondary | ICD-10-CM

## 2017-07-01 LAB — POCT URINE PREGNANCY: Preg Test, Ur: NEGATIVE

## 2017-07-01 MED ORDER — MEDROXYPROGESTERONE ACETATE 150 MG/ML IM SUSP
150.0000 mg | Freq: Once | INTRAMUSCULAR | Status: AC
  Administered 2017-07-01: 150 mg via INTRAMUSCULAR

## 2017-07-01 NOTE — Progress Notes (Signed)
Pt here for depo injection 150 mg IM given rt ventrogluteal. Tolerated well. Return 12 week for next injection. Pad CMA

## 2017-08-10 ENCOUNTER — Telehealth: Payer: Self-pay | Admitting: *Deleted

## 2017-08-10 NOTE — Telephone Encounter (Signed)
Pt is on Lexapro for depression. She got a kitten that she feels is helping her to feel a lot better. Her landlord needs a form filled out by the prescriber of the Lexapro stating that the kitten is helping with her depression to allow her to keep it. Advised patient that I would check with Anderson Malta to see if this is something that we can do and let her know. Patient agreeable.

## 2017-08-11 ENCOUNTER — Telehealth: Payer: Self-pay | Admitting: *Deleted

## 2017-08-11 NOTE — Telephone Encounter (Signed)
Per Tiffany [name] patient will need to be seen prior to having for filled out. Called patient and informed her of this information she stated that she would have to call us back.

## 2017-08-12 NOTE — Telephone Encounter (Signed)
LMOVM that per Anderson Malta she will need appt to discuss.

## 2017-08-16 ENCOUNTER — Telehealth: Payer: Self-pay | Admitting: Adult Health

## 2017-08-16 NOTE — Telephone Encounter (Signed)
Patient called stating that she missed Jennifer's phone call on Friday and wasn't ale to call back, pt would like call back from [place].

## 2017-08-24 ENCOUNTER — Ambulatory Visit (INDEPENDENT_AMBULATORY_CARE_PROVIDER_SITE_OTHER): Payer: Medicaid Other | Admitting: Adult Health

## 2017-08-24 ENCOUNTER — Encounter: Payer: Self-pay | Admitting: Adult Health

## 2017-08-24 VITALS — BP 113/78 | HR 85 | Ht 66.0 in | Wt 234.0 lb

## 2017-08-24 DIAGNOSIS — F32A Depression, unspecified: Secondary | ICD-10-CM

## 2017-08-24 DIAGNOSIS — F329 Major depressive disorder, single episode, unspecified: Secondary | ICD-10-CM

## 2017-08-24 MED ORDER — ESCITALOPRAM OXALATE 10 MG PO TABS: 10.0000 mg | ORAL_TABLET | Freq: Every day | ORAL | 6 refills | Status: DC

## 2017-08-24 NOTE — Progress Notes (Signed)
  Subjective:     Patient ID: Tiffany [name]. Tiffany [name], female   DOB: [birth year], 27 y.o.   MRN: [id number]  HPI Tiffany [name] is a 27 year old black female back in follow up on taking lexapro, started in December but not taking regularly.She got a cat and that helps.She needs note signed to keep cat in her home.   Review of Systems +depression, better with cat Reviewed past medical,surgical, social and family history. Reviewed medications and allergies.     Objective:   Physical Exam BP 113/78 (BP Location: Right Arm, Patient Position: Sitting, Cuff Size: Normal)   Pulse 85   Ht 5\' 6"  (1.676 m)   Wt 234 lb (106.1 kg)   BMI 37.77 kg/m    PHQ 9 score 13, was 18 in December, she denies being suicidal and has not taken lexapro regularly but is willing to take every day, she says having a cat has helped. Assessment:     1. Depression, unspecified depression type       Plan:    Take lexapro daily Meds ordered this encounter  Medications  . escitalopram (LEXAPRO) 10 MG tablet    Sig: Take 1 tablet (10 mg total) by mouth daily.    Dispense:  30 tablet    Refill:  6    Order Specific Question:   Supervising Provider    Answer:   Tania Ade H [[id number]]  F/U in 6 weeks  Note signed for her to have the cat in her home

## 2017-08-31 DIAGNOSIS — T63461A Toxic effect of venom of wasps, accidental (unintentional), initial encounter: Secondary | ICD-10-CM | POA: Diagnosis not present

## 2017-09-23 ENCOUNTER — Ambulatory Visit: Payer: Medicaid Other

## 2017-09-24 ENCOUNTER — Ambulatory Visit (INDEPENDENT_AMBULATORY_CARE_PROVIDER_SITE_OTHER): Payer: Medicaid Other | Admitting: *Deleted

## 2017-09-24 ENCOUNTER — Encounter: Payer: Self-pay | Admitting: *Deleted

## 2017-09-24 DIAGNOSIS — Z3042 Encounter for surveillance of injectable contraceptive: Secondary | ICD-10-CM

## 2017-09-24 DIAGNOSIS — Z3202 Encounter for pregnancy test, result negative: Secondary | ICD-10-CM | POA: Diagnosis not present

## 2017-09-24 LAB — POCT URINE PREGNANCY: PREG TEST UR: NEGATIVE

## 2017-09-24 MED ORDER — MEDROXYPROGESTERONE ACETATE 150 MG/ML IM SUSP
150.0000 mg | Freq: Once | INTRAMUSCULAR | Status: AC
  Administered 2017-09-24: 150 mg via INTRAMUSCULAR

## 2017-09-24 NOTE — Progress Notes (Signed)
Depo Provera 150mg  given in right ventrogluteal with no complications. Pt to return in 12 weeks for next injection.

## 2017-10-05 ENCOUNTER — Encounter: Payer: Self-pay | Admitting: Adult Health

## 2017-10-05 ENCOUNTER — Ambulatory Visit (INDEPENDENT_AMBULATORY_CARE_PROVIDER_SITE_OTHER): Payer: Medicaid Other | Admitting: Adult Health

## 2017-10-05 VITALS — BP 120/72 | HR 89 | Ht 67.0 in | Wt 235.0 lb

## 2017-10-05 DIAGNOSIS — F329 Major depressive disorder, single episode, unspecified: Secondary | ICD-10-CM

## 2017-10-05 DIAGNOSIS — F419 Anxiety disorder, unspecified: Secondary | ICD-10-CM

## 2017-10-05 DIAGNOSIS — F32A Depression, unspecified: Secondary | ICD-10-CM

## 2017-10-05 MED ORDER — BUSPIRONE HCL 5 MG PO TABS: 5.0000 mg | ORAL_TABLET | Freq: Three times a day (TID) | ORAL | 1 refills | Status: DC

## 2017-10-05 NOTE — Progress Notes (Signed)
  Subjective:     Patient ID: Tiffany [name]. Tiffany [name], female   DOB: 05-14-1990, 27 y.o.   MRN: [id number]  HPI Tiffany [name] is a 27 year old black female in to discuss lexapro, and she says it is not working, but she is not taking it, every day.   Review of Systems +depression ?anxiety Feels sluggish Reviewed past medical,surgical, social and family history. Reviewed medications and allergies.     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 Feels anxious at times, but not having panic attacks, and still has cat but says he is getting on her nerves now. PHQ 9 score 18, denies being suicidal or homicidal, is on lexapro.will add buspar, and needs to take lexapro every day.Discussed with her that she needs to take the medicine every day, or it will not work.  Will refer to [facility] Har[facility] at [facility].     Assessment:     1. Depression, unspecified depression type   2. Anxiety       Plan:     Take lexapro every day Meds ordered this encounter  Medications  . busPIRone (BUSPAR) 5 MG tablet    Sig: Take 1 tablet (5 mg total) by mouth 3 (three) times daily.    Dispense:  90 tablet    Refill:  1    Order Specific Question:   Supervising Provider    Answer:   Florian Buff [[id number]]  Referred to Dr Harrington Challenger at [facility] F/U in 6 weeks

## 2017-11-16 ENCOUNTER — Ambulatory Visit: Payer: Medicaid Other | Admitting: Adult Health

## 2017-11-19 ENCOUNTER — Ambulatory Visit: Payer: Medicaid Other | Admitting: Obstetrics & Gynecology

## 2017-11-23 ENCOUNTER — Ambulatory Visit: Payer: Medicaid Other | Admitting: Adult Health
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
## 2017-11-29 ENCOUNTER — Encounter (HOSPITAL_COMMUNITY): Payer: Self-pay | Admitting: Psychiatry

## 2017-11-29 ENCOUNTER — Encounter (INDEPENDENT_AMBULATORY_CARE_PROVIDER_SITE_OTHER): Payer: Self-pay

## 2017-11-29 ENCOUNTER — Ambulatory Visit (INDEPENDENT_AMBULATORY_CARE_PROVIDER_SITE_OTHER): Payer: Medicaid Other | Admitting: Psychiatry

## 2017-11-29 VITALS — BP 108/77 | HR 62 | Ht 67.0 in | Wt 242.0 lb

## 2017-11-29 DIAGNOSIS — F331 Major depressive disorder, recurrent, moderate: Secondary | ICD-10-CM | POA: Diagnosis not present

## 2017-11-29 NOTE — Patient Instructions (Addendum)
1. Continue lexapro 10 mg daily  2. Continue buspar 5 mg three times a day  3. Return to clinic in one month for 30 mins  CONTACT INFORMATION  What to do if you need to get in touch with someone regarding a psychiatric issue:  1. EMERGENCY: For psychiatric emergencies (if you are suicidal or if there are any other safety issues) call 911 and/or go to your nearest Emergency Room immediately.   2. IF YOU NEED SOMEONE TO TALK TO RIGHT NOW: Given my clinical responsibilities, I may not be able to speak with you over the phone for a prolonged period of time.  a. You may always call The National Suicide Prevention Lifeline at 1-800-273-TALK 678-626-9864).  b. Your county of residence will also have local crisis services. For [facility]: [place] at [phone] ([place])

## 2017-11-30 LAB — TSH: TSH: 1.54 m[IU]/L

## 2017-12-17 ENCOUNTER — Ambulatory Visit: Payer: Medicaid Other

## 2017-12-20 ENCOUNTER — Ambulatory Visit: Payer: Medicaid Other

## 2017-12-20 ENCOUNTER — Other Ambulatory Visit: Payer: Self-pay | Admitting: Adult Health

## 2017-12-21 ENCOUNTER — Ambulatory Visit (INDEPENDENT_AMBULATORY_CARE_PROVIDER_SITE_OTHER): Payer: Medicaid Other

## 2017-12-21 VITALS — Ht 66.0 in | Wt 242.6 lb

## 2017-12-21 DIAGNOSIS — Z3042 Encounter for surveillance of injectable contraceptive: Secondary | ICD-10-CM | POA: Diagnosis not present

## 2017-12-21 DIAGNOSIS — Z3202 Encounter for pregnancy test, result negative: Secondary | ICD-10-CM | POA: Diagnosis not present

## 2017-12-21 LAB — POCT URINE PREGNANCY: PREG TEST UR: NEGATIVE

## 2017-12-21 MED ORDER — MEDROXYPROGESTERONE ACETATE 150 MG/ML IM SUSP
150.0000 mg | Freq: Once | INTRAMUSCULAR | Status: AC
  Administered 2017-12-21: 150 mg via INTRAMUSCULAR

## 2017-12-21 NOTE — Progress Notes (Signed)
Pt here for depo injection 150 mg IM given lt VG. Tolerated well. Return 12 weeks for next injection. Pad CMA
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
## 2017-12-30 ENCOUNTER — Ambulatory Visit (INDEPENDENT_AMBULATORY_CARE_PROVIDER_SITE_OTHER): Payer: Medicaid Other | Admitting: Psychiatry

## 2017-12-30 ENCOUNTER — Encounter (HOSPITAL_COMMUNITY): Payer: Self-pay | Admitting: Psychiatry

## 2017-12-30 VITALS — BP 127/84 | HR 89 | Ht 66.0 in | Wt 240.0 lb

## 2017-12-30 DIAGNOSIS — F331 Major depressive disorder, recurrent, moderate: Secondary | ICD-10-CM | POA: Diagnosis not present

## 2017-12-30 DIAGNOSIS — F419 Anxiety disorder, unspecified: Secondary | ICD-10-CM

## 2017-12-30 DIAGNOSIS — G4700 Insomnia, unspecified: Secondary | ICD-10-CM | POA: Diagnosis not present

## 2017-12-30 MED ORDER — ESCITALOPRAM OXALATE 20 MG PO TABS: 20.0000 mg | ORAL_TABLET | Freq: Every day | ORAL | 0 refills | Status: DC

## 2017-12-30 MED ORDER — TRAZODONE HCL 50 MG PO TABS: ORAL_TABLET | ORAL | 0 refills | Status: DC

## 2017-12-30 NOTE — Patient Instructions (Addendum)
1. Increase lexapro 20 mg daily  2. Continue buspar 5 mg three times a day   3. Start Trazodone 25-50 mg at night as needed for sleep 4. Return to clinic in two months for 15 mins CONTACT INFORMATION  What to do if you need to get in touch with someone regarding a psychiatric issue:  1. EMERGENCY: For psychiatric emergencies (if you are suicidal or if there are any other safety issues) call 911 and/or go to your nearest Emergency Room immediately.   2. IF YOU NEED SOMEONE TO TALK TO RIGHT NOW: Given my clinical responsibilities, I may not be able to speak with you over the phone for a prolonged period of time.  a. You may always call The National Suicide Prevention Lifeline at 1-800-273-TALK [phone]).  b. Your county of residence will also have local crisis services. For [facility]: [place] at [phone] ([place])

## 2018-02-15 DIAGNOSIS — Z029 Encounter for administrative examinations, unspecified: Secondary | ICD-10-CM
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
## 2018-03-03 ENCOUNTER — Ambulatory Visit (HOSPITAL_COMMUNITY): Payer: Medicaid Other | Admitting: Psychiatry

## 2018-03-15 ENCOUNTER — Encounter: Payer: Self-pay | Admitting: *Deleted

## 2018-03-15 ENCOUNTER — Ambulatory Visit (INDEPENDENT_AMBULATORY_CARE_PROVIDER_SITE_OTHER): Payer: Medicaid Other | Admitting: *Deleted

## 2018-03-15 DIAGNOSIS — Z3042 Encounter for surveillance of injectable contraceptive: Secondary | ICD-10-CM

## 2018-03-15 DIAGNOSIS — Z3202 Encounter for pregnancy test, result negative: Secondary | ICD-10-CM

## 2018-03-15 DIAGNOSIS — Z308 Encounter for other contraceptive management: Secondary | ICD-10-CM

## 2018-03-15 LAB — POCT URINE PREGNANCY: Preg Test, Ur: NEGATIVE

## 2018-03-15 MED ORDER — MEDROXYPROGESTERONE ACETATE 150 MG/ML IM SUSP
150.0000 mg | Freq: Once | INTRAMUSCULAR | Status: AC
  Administered 2018-03-15: 150 mg via INTRAMUSCULAR
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
## 2018-03-15 NOTE — Progress Notes (Signed)
Pt here for Depo. Pt received shot in right hip. Pt tolerated shot well. Return in 12 weeks for next shot. Schedule physical also. [place]

## 2018-03-21 ENCOUNTER — Ambulatory Visit (HOSPITAL_COMMUNITY): Payer: Medicaid Other | Admitting: Psychiatry

## 2018-05-11 DIAGNOSIS — Z029 Encounter for administrative examinations, unspecified: Secondary | ICD-10-CM

## 2018-05-23 ENCOUNTER — Other Ambulatory Visit: Payer: Self-pay | Admitting: Adult Health

## 2018-06-07 ENCOUNTER — Other Ambulatory Visit: Payer: Self-pay

## 2018-06-07 ENCOUNTER — Ambulatory Visit (INDEPENDENT_AMBULATORY_CARE_PROVIDER_SITE_OTHER): Payer: Medicaid Other | Admitting: *Deleted

## 2018-06-07 DIAGNOSIS — Z3042 Encounter for surveillance of injectable contraceptive: Secondary | ICD-10-CM | POA: Diagnosis not present

## 2018-06-07 MED ORDER — MEDROXYPROGESTERONE ACETATE 150 MG/ML IM SUSP
150.0000 mg | Freq: Once | INTRAMUSCULAR | Status: AC
  Administered 2018-06-07: 16:00:00 150 mg via INTRAMUSCULAR

## 2018-06-07 NOTE — Progress Notes (Signed)
Depo Provera 150 mg given IM in left deltoid. Patient tolerated well. Next dose in 12 weeks.

## 2018-07-18 ENCOUNTER — Telehealth: Payer: Self-pay | Admitting: Adult Health

## 2018-07-18 NOTE — Telephone Encounter (Signed)

## 2018-07-20 ENCOUNTER — Ambulatory Visit (INDEPENDENT_AMBULATORY_CARE_PROVIDER_SITE_OTHER): Payer: Medicaid Other | Admitting: Adult Health

## 2018-07-20 ENCOUNTER — Other Ambulatory Visit: Payer: Self-pay

## 2018-07-20 ENCOUNTER — Encounter: Payer: Self-pay | Admitting: Adult Health

## 2018-07-20 VITALS — BP 127/87 | HR 102 | Ht 66.25 in | Wt 247.5 lb

## 2018-07-20 DIAGNOSIS — Z3042 Encounter for surveillance of injectable contraceptive: Secondary | ICD-10-CM

## 2018-07-20 DIAGNOSIS — F419 Anxiety disorder, unspecified: Secondary | ICD-10-CM

## 2018-07-20 DIAGNOSIS — F329 Major depressive disorder, single episode, unspecified: Secondary | ICD-10-CM

## 2018-07-20 DIAGNOSIS — Z01419 Encounter for gynecological examination (general) (routine) without abnormal findings: Secondary | ICD-10-CM | POA: Insufficient documentation

## 2018-07-20 DIAGNOSIS — F32A Depression, unspecified: Secondary | ICD-10-CM

## 2018-07-20 DIAGNOSIS — Z0000 Encounter for general adult medical examination without abnormal findings: Secondary | ICD-10-CM | POA: Diagnosis not present

## 2018-07-20 MED ORDER — MEDROXYPROGESTERONE ACETATE 150 MG/ML IM SUSY: 150.0000 mg | PREFILLED_SYRINGE | INTRAMUSCULAR | 4 refills | Status: DC

## 2018-07-20 MED ORDER — BUSPIRONE HCL 5 MG PO TABS: 5.0000 mg | ORAL_TABLET | Freq: Three times a day (TID) | ORAL | 6 refills | Status: DC

## 2018-07-20 NOTE — Progress Notes (Signed)
Patient ID: Tiffany [name]. Tiffany [name], female   DOB: 1990/08/16, 28 y.o.   MRN: [id number] History of Present Illness: Tiffany [name] is a 28 year old black female, G2P2, in for well woman gyn exam, she had normal pap, 01/13/17. PCP [facility].   Current Medications, Allergies, Past Medical History, Past Surgical History, Family History and Social History were reviewed in [employer] record.     Review of Systems: Patient denies any headaches, hearing loss, fatigue, blurred vision, shortness of breath, chest pain, abdominal pain, problems with bowel movements, urination, or intercourse(not active). No joint pain or mood swings. thinks depression getting worse but not taking lexapro as prescribed  She is happy with depo, no periods or cramps.  Physical Exam:BP 127/87 (BP Location: Left Arm, Patient Position: Sitting, Cuff Size: Normal)   Pulse (!) 102   Ht 5' 6.25" (1.683 m)   Wt 247 lb 8 oz (112.3 kg)   BMI 39.65 kg/m  General:  Well developed, well nourished, no acute distress Skin:  Warm and dry Neck:  Midline trachea, normal thyroid, good ROM, no lymphadenopathy Lungs; Clear to auscultation bilaterally Breast:  No dominant palpable mass, retraction, or nipple discharge Cardiovascular: Regular rate and rhythm Abdomen:  Soft, non tender, no hepatosplenomegaly Pelvic:  External genitalia is normal in appearance, no lesions.  The vagina is normal in appearance. Urethra has no lesions or masses. The cervix is bulbous.  Uterus is felt to be normal size, shape, and contour.  No adnexal masses or tenderness noted.Bladder is non tender, no masses felt. Extremities/musculoskeletal:  No swelling or varicosities noted, no clubbing or cyanosis Psych:  No mood changes, alert and cooperative,seems happy Fall risk is low. PHQ 9 score 13, is on meds and denies being suicidal, but needs follow up with [facility], has not been taking lexapro as prescribed, makes feel like a zombie, try taking in the  evenings. Examination chaperoned by Levy Pupa LPN.  Impression: 1. Encounter for well woman exam with routine gynecological exam   2. Depression, unspecified depression type   3. Anxiety   4. Encounter for surveillance of injectable contraceptive       Plan: Meds ordered this encounter  Medications  . busPIRone (BUSPAR) 5 MG tablet    Sig: Take 1 tablet (5 mg total) by mouth 3 (three) times daily.    Dispense:  90 tablet    Refill:  6    Order Specific Question:   Supervising Provider    Answer:   Elonda Husky, LUTHER H [[id number]]  . medroxyPROGESTERone Acetate 150 MG/ML SUSY    Sig: Inject 1 mL (150 mg total) into the muscle every 3 (three) months.    Dispense:  1 mL    Refill:  4    Order Specific Question:   Supervising Provider    Answer:   Florian Buff [[id number]]  Call [facility] and physical in 1 year Depo due  August 30, 2018

## 2018-08-29 ENCOUNTER — Telehealth: Payer: Self-pay | Admitting: Obstetrics and Gynecology

## 2018-08-29 NOTE — Telephone Encounter (Signed)

## 2018-08-30 ENCOUNTER — Other Ambulatory Visit: Payer: Self-pay

## 2018-08-30 ENCOUNTER — Ambulatory Visit (INDEPENDENT_AMBULATORY_CARE_PROVIDER_SITE_OTHER): Payer: Medicaid Other

## 2018-08-30 DIAGNOSIS — Z3042 Encounter for surveillance of injectable contraceptive: Secondary | ICD-10-CM

## 2018-08-30 MED ORDER — MEDROXYPROGESTERONE ACETATE 150 MG/ML IM SUSP
150.0000 mg | Freq: Once | INTRAMUSCULAR | Status: AC
  Administered 2018-08-30: 150 mg via INTRAMUSCULAR

## 2018-08-30 NOTE — Progress Notes (Signed)
Pt here for depo 150 mg IM given rt deltoid. Tolerated well. Return 12 weeks for next injection. Pad CMA

## 2018-09-21 ENCOUNTER — Telehealth: Payer: Self-pay | Admitting: Adult Health

## 2018-09-21 NOTE — Telephone Encounter (Signed)
error 

## 2018-10-11 ENCOUNTER — Other Ambulatory Visit: Payer: Self-pay

## 2018-10-12 ENCOUNTER — Encounter: Payer: Self-pay | Admitting: Family Medicine

## 2018-10-12 ENCOUNTER — Ambulatory Visit (INDEPENDENT_AMBULATORY_CARE_PROVIDER_SITE_OTHER): Payer: Medicaid Other | Admitting: Family Medicine

## 2018-10-12 VITALS — BP 113/70 | HR 86 | Temp 97.5°F | Resp 16 | Ht 66.0 in | Wt 244.6 lb

## 2018-10-12 DIAGNOSIS — M5441 Lumbago with sciatica, right side: Secondary | ICD-10-CM

## 2018-10-12 DIAGNOSIS — F331 Major depressive disorder, recurrent, moderate: Secondary | ICD-10-CM | POA: Diagnosis not present

## 2018-10-12 DIAGNOSIS — F419 Anxiety disorder, unspecified: Secondary | ICD-10-CM

## 2018-10-12 DIAGNOSIS — R200 Anesthesia of skin: Secondary | ICD-10-CM

## 2018-10-12 DIAGNOSIS — R202 Paresthesia of skin: Secondary | ICD-10-CM

## 2018-10-12 DIAGNOSIS — G8929 Other chronic pain: Secondary | ICD-10-CM

## 2018-10-12 MED ORDER — IBUPROFEN 600 MG PO TABS: 600.0000 mg | ORAL_TABLET | Freq: Three times a day (TID) | ORAL | 2 refills | Status: DC | PRN

## 2018-10-12 MED ORDER — PAROXETINE HCL 20 MG PO TABS: 20.0000 mg | ORAL_TABLET | Freq: Every day | ORAL | 2 refills | Status: DC

## 2018-10-12 MED ORDER — ESCITALOPRAM OXALATE 20 MG PO TABS: 10.0000 mg | ORAL_TABLET | Freq: Every day | ORAL | 0 refills | Status: DC

## 2018-10-12 NOTE — Patient Instructions (Addendum)
Decrease Lexapro to half a tablet (10 mg) once daily x1 week. Then stop it and start Paxil 20 mg once daily.   Take Ibuprofen 600 mg with Tylenol 500 mg by mouth every 6-8 hours as needed for pain.   Back Exercises These exercises help to make your trunk and back strong. They also help to keep the lower back flexible. Doing these exercises can help to prevent back pain or lessen existing pain.  If you have back pain, try to do these exercises 2-3 times each day or as told by your doctor.  As you get better, do the exercises once each day. Repeat the exercises more often as told by your doctor.  To stop back pain from coming back, do the exercises once each day, or as told by your doctor. Exercises Single knee to chest Do these steps 3-5 times in a row for each leg: 1. Lie on your back on a firm bed or the floor with your legs stretched out. 2. Bring one knee to your chest. 3. Grab your knee or thigh with both hands and hold them it in place. 4. Pull on your knee until you feel a gentle stretch in your lower back or buttocks. 5. Keep doing the stretch for 10-30 seconds. 6. Slowly let go of your leg and straighten it. Pelvic tilt Do these steps 5-10 times in a row: 1. Lie on your back on a firm bed or the floor with your legs stretched out. 2. Bend your knees so they point up to the ceiling. Your feet should be flat on the floor. 3. Tighten your lower belly (abdomen) muscles to press your lower back against the floor. This will make your tailbone point up to the ceiling instead of pointing down to your feet or the floor. 4. Stay in this position for 5-10 seconds while you gently tighten your muscles and breathe evenly. Cat-cow Do these steps until your lower back bends more easily: 1. Get on your hands and knees on a firm surface. Keep your hands under your shoulders, and keep your knees under your hips. You may put padding under your knees. 2. Let your head hang down toward your chest.  Tighten (contract) the muscles in your belly. Point your tailbone toward the floor so your lower back becomes rounded like the back of a cat. 3. Stay in this position for 5 seconds. 4. Slowly lift your head. Let the muscles of your belly relax. Point your tailbone up toward the ceiling so your back forms a sagging arch like the back of a cow. 5. Stay in this position for 5 seconds.  Press-ups Do these steps 5-10 times in a row: 1. Lie on your belly (face-down) on the floor. 2. Place your hands near your head, about shoulder-width apart. 3. While you keep your back relaxed and keep your hips on the floor, slowly straighten your arms to raise the top half of your body and lift your shoulders. Do not use your back muscles. You may change where you place your hands in order to make yourself more comfortable. 4. Stay in this position for 5 seconds. 5. Slowly return to lying flat on the floor.  Bridges Do these steps 10 times in a row: 1. Lie on your back on a firm surface. 2. Bend your knees so they point up to the ceiling. Your feet should be flat on the floor. Your arms should be flat at your sides, next to your body. 3.  Tighten your butt muscles and lift your butt off the floor until your waist is almost as high as your knees. If you do not feel the muscles working in your butt and the back of your thighs, slide your feet 1-2 inches farther away from your butt. 4. Stay in this position for 3-5 seconds. 5. Slowly lower your butt to the floor, and let your butt muscles relax. If this exercise is too easy, try doing it with your arms crossed over your chest. Belly crunches Do these steps 5-10 times in a row: 1. Lie on your back on a firm bed or the floor with your legs stretched out. 2. Bend your knees so they point up to the ceiling. Your feet should be flat on the floor. 3. Cross your arms over your chest. 4. Tip your chin a little bit toward your chest but do not bend your neck. 5. Tighten  your belly muscles and slowly raise your chest just enough to lift your shoulder blades a tiny bit off of the floor. Avoid raising your body higher than that, because it can put too much stress on your low back. 6. Slowly lower your chest and your head to the floor. Back lifts Do these steps 5-10 times in a row: 1. Lie on your belly (face-down) with your arms at your sides, and rest your forehead on the floor. 2. Tighten the muscles in your legs and your butt. 3. Slowly lift your chest off of the floor while you keep your hips on the floor. Keep the back of your head in line with the curve in your back. Look at the floor while you do this. 4. Stay in this position for 3-5 seconds. 5. Slowly lower your chest and your face to the floor. Contact a doctor if:  Your back pain gets a lot worse when you do an exercise.  Your back pain does not get better 2 hours after you exercise. If you have any of these problems, stop doing the exercises. Do not do them again unless your doctor says it is okay. Get help right away if:  You have sudden, very bad back pain. If this happens, stop doing the exercises. Do not do them again unless your doctor says it is okay. This information is not intended to replace advice given to you by your health care provider. Make sure you discuss any questions you have with your health care provider. Document Released: 02/14/2010 Document Revised: 10/07/2017 Document Reviewed: 10/07/2017 Elsevier Patient Education  2020 [employer].
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
## 2018-11-02 ENCOUNTER — Telehealth: Payer: Self-pay | Admitting: Family Medicine

## 2018-11-02 ENCOUNTER — Emergency Department (HOSPITAL_COMMUNITY)
Admission: EM | Admit: 2018-11-02 | Discharge: 2018-11-02 | Disposition: A | Discharge status: Home / Self Care | Payer: Medicaid Other | Attending: Emergency Medicine | Admitting: Emergency Medicine

## 2018-11-02 ENCOUNTER — Encounter (HOSPITAL_COMMUNITY): Payer: Self-pay | Admitting: Emergency Medicine

## 2018-11-02 ENCOUNTER — Other Ambulatory Visit: Payer: Self-pay

## 2018-11-02 DIAGNOSIS — F17210 Nicotine dependence, cigarettes, uncomplicated: Secondary | ICD-10-CM | POA: Diagnosis not present

## 2018-11-02 DIAGNOSIS — Z79899 Other long term (current) drug therapy: Secondary | ICD-10-CM | POA: Insufficient documentation

## 2018-11-02 DIAGNOSIS — N644 Mastodynia: Secondary | ICD-10-CM | POA: Diagnosis present

## 2018-11-02 DIAGNOSIS — L089 Local infection of the skin and subcutaneous tissue, unspecified: Secondary | ICD-10-CM | POA: Diagnosis not present

## 2018-11-02 MED ORDER — SULFAMETHOXAZOLE-TRIMETHOPRIM 800-160 MG PO TABS: 1.0000 | ORAL_TABLET | Freq: Two times a day (BID) | ORAL | 0 refills | Status: AC

## 2018-11-02 NOTE — Telephone Encounter (Signed)
Do we get notification regarding peer to peer reviews when insurance denies?

## 2018-11-02 NOTE — Telephone Encounter (Signed)
Patient states that she received a letter in the mail not from our office stating that insurance will not cover an MRI for her. The letter told her to contact our office for further instruction. Please review and advise

## 2018-11-02 NOTE — Discharge Instructions (Signed)
Return if any problems.  Soak area 20 minutes 4 times a day

## 2018-11-02 NOTE — ED Triage Notes (Signed)
Pt states there is drainage coming from under her right breast since yesterday.

## 2018-11-03 NOTE — ED Provider Notes (Signed)
[facility] EMERGENCY DEPARTMENT Provider Note   CSN: [id number] Arrival date & time: 11/02/18  1823     History   Chief Complaint Chief Complaint  Patient presents with  . Breast Pain    HPI Tiffany [name] is a 28 y.o. female.     The history is provided by the patient. No language interpreter was used.  Abscess Location:  Torso Torso abscess location:  R breast Size:  1 Abscess quality: redness   Red streaking: no   Progression:  Worsening Chronicity:  New Relieved by:  Nothing Worsened by:  Nothing Ineffective treatments:  None tried Risk factors: no prior abscess     Past Medical History:  Diagnosis Date  . Anxiety   . Chronic back pain   . Depression     Patient Active Problem List   Diagnosis Date Noted  . MDD (major depressive disorder), recurrent episode, moderate ([place]) 10/12/2018  . Chronic right-sided low back pain with right-sided sciatica 10/12/2018  . Anxiety 10/05/2017    Past Surgical History:  Procedure Laterality Date  . EXTERNAL EAR SURGERY Left   . LEG SURGERY Left      OB History    Gravida  2   Para  2   Term  2   Preterm      AB      Living  2     SAB      TAB      Ectopic      Multiple  0   Live Births  2            Home Medications    Prior to Admission medications   Medication Sig Start Date End Date Taking? Authorizing Provider  escitalopram (LEXAPRO) 20 MG tablet Take 0.5 tablets (10 mg total) by mouth daily for 7 days. 10/12/18 10/19/18  Loman Brooklyn, FNP  ibuprofen (ADVIL) 600 MG tablet Take 1 tablet (600 mg total) by mouth every 8 (eight) hours as needed. 10/12/18   Loman Brooklyn, FNP  medroxyPROGESTERone Acetate 150 MG/ML SUSY Inject 1 mL (150 mg total) into the muscle every 3 (three) months. 07/20/18   Estill Dooms, NP  PARoxetine (PAXIL) 20 MG tablet Take 1 tablet (20 mg total) by mouth daily. 10/12/18   Loman Brooklyn, FNP  sulfamethoxazole-trimethoprim (BACTRIM DS) 800-160 MG  tablet Take 1 tablet by mouth 2 (two) times daily for 7 days. 11/02/18 11/09/18  Fransico Meadow, PA-C  traZODone (DESYREL) 50 MG tablet 25-50 mg at night for insomnia 12/30/17   Norman Clay, MD    Family History Family History  Problem Relation Age of Onset  . Depression Mother   . Asthma Sister   . Asthma Sister   . Depression Maternal Grandmother   . Hypertension Maternal Grandfather   . Other Maternal Grandfather        blood clots    Social History Social History   Tobacco Use  . Smoking status: Current Every Day Smoker    Years: 2.00    Types: Cigarettes  . Smokeless tobacco: Never Used  Substance Use Topics  . Alcohol use: Yes    Comment: occ  . Drug use: Not Currently    Types: Marijuana    Comment: used in the past     Allergies   Other   Review of Systems Review of Systems  Skin: Positive for wound.  All other systems reviewed and are negative.    Physical Exam Updated  Vital Signs BP 128/81 (BP Location: Right Arm)   Pulse (!) 106   Temp 98.8 F (37.1 C) (Oral)   Resp 18   SpO2 100%   Physical Exam Vitals signs reviewed.  HENT:     Head: Normocephalic.  Musculoskeletal:     Comments: 1cm area of redness under right breast,  Open  Slight drainage   Skin:    General: Skin is warm.  Neurological:     General: No focal deficit present.     Mental Status: She is alert.  Psychiatric:        Mood and Affect: Mood normal.      ED Treatments / Results  Labs (all labs ordered are listed, but only abnormal results are displayed) Labs Reviewed - No data to display  EKG None  Radiology No results found.  Procedures Procedures (including critical care time)  Medications Ordered in ED Medications - No data to display   Initial Impression / Assessment and Plan / ED Course  I have reviewed the triage vital signs and the nursing notes.  Pertinent labs & imaging results that were available during my care of the patient were reviewed by  me and considered in my medical decision making (see chart for details).        MDM  Pt counseled on skin infection  Abscess,  Pt given rx for bactrim  Pt advised warm compresses   Final Clinical Impressions(s) / ED Diagnoses   Final diagnoses:  Skin infection    ED Discharge Orders         Ordered    sulfamethoxazole-trimethoprim (BACTRIM DS) 800-160 MG tablet  2 times daily     11/02/18 1949        An After Visit Summary was printed and given to the patient.    Fransico Meadow, [place] 11/03/18 [id number]    Julianne Rice, MD 11/11/18 [phone]

## 2018-11-09 ENCOUNTER — Emergency Department (HOSPITAL_COMMUNITY): Payer: Medicaid Other

## 2018-11-09 ENCOUNTER — Encounter (HOSPITAL_COMMUNITY): Payer: Self-pay | Admitting: *Deleted

## 2018-11-09 ENCOUNTER — Other Ambulatory Visit: Payer: Self-pay

## 2018-11-09 DIAGNOSIS — W010XXA Fall on same level from slipping, tripping and stumbling without subsequent striking against object, initial encounter: Secondary | ICD-10-CM | POA: Diagnosis not present

## 2018-11-09 DIAGNOSIS — Y999 Unspecified external cause status: Secondary | ICD-10-CM | POA: Diagnosis not present

## 2018-11-09 DIAGNOSIS — M79641 Pain in right hand: Secondary | ICD-10-CM | POA: Diagnosis not present

## 2018-11-09 DIAGNOSIS — S6991XA Unspecified injury of right wrist, hand and finger(s), initial encounter: Secondary | ICD-10-CM | POA: Diagnosis present

## 2018-11-09 DIAGNOSIS — S638X1A Sprain of other part of right wrist and hand, initial encounter: Secondary | ICD-10-CM | POA: Insufficient documentation

## 2018-11-09 DIAGNOSIS — S63501A Unspecified sprain of right wrist, initial encounter: Secondary | ICD-10-CM | POA: Diagnosis not present

## 2018-11-09 DIAGNOSIS — Y9289 Other specified places as the place of occurrence of the external cause: Secondary | ICD-10-CM | POA: Insufficient documentation

## 2018-11-09 DIAGNOSIS — Y9389 Activity, other specified: Secondary | ICD-10-CM | POA: Diagnosis not present

## 2018-11-09 DIAGNOSIS — F17210 Nicotine dependence, cigarettes, uncomplicated: Secondary | ICD-10-CM | POA: Diagnosis not present

## 2018-11-09 NOTE — ED Triage Notes (Signed)
Pt c/o right wrist pain after falling x one hour ago

## 2018-11-10 ENCOUNTER — Emergency Department (HOSPITAL_COMMUNITY)
Admission: EM | Admit: 2018-11-10 | Discharge: 2018-11-10 | Disposition: A | Discharge status: Home / Self Care | Payer: Medicaid Other | Attending: Emergency Medicine | Admitting: Emergency Medicine

## 2018-11-10 DIAGNOSIS — S63501A Unspecified sprain of right wrist, initial encounter: Secondary | ICD-10-CM

## 2018-11-10 MED ORDER — IBUPROFEN 400 MG PO TABS
400.0000 mg | ORAL_TABLET | Freq: Once | ORAL | Status: AC
  Administered 2018-11-10: 400 mg via ORAL
  Filled 2018-11-10: qty 1

## 2018-11-10 NOTE — ED Provider Notes (Signed)
[facility] EMERGENCY DEPARTMENT Provider Note   CSN: [id number] Arrival date & time: 11/09/18  2113     History   Chief Complaint Chief Complaint  Patient presents with  . Hand Pain    HPI [name] is a 28 y.o. female.     The history is provided by the patient.  Hand Pain This is a new problem. The problem occurs constantly. The problem has been gradually improving. Pertinent negatives include no headaches. The symptoms are aggravated by bending. The symptoms are relieved by rest.  Patient reports she was moving around furniture when she fell landing on her right hand and wrist.  She is not sure if it was outstretched.  No other acute injuries. Reports pain in right hand and wrist  Past Medical History:  Diagnosis Date  . Anxiety   . Chronic back pain   . Depression     Patient Active Problem List   Diagnosis Date Noted  . MDD (major depressive disorder), recurrent episode, moderate ([place]) 10/12/2018  . Chronic right-sided low back pain with right-sided sciatica 10/12/2018  . Anxiety 10/05/2017    Past Surgical History:  Procedure Laterality Date  . EXTERNAL EAR SURGERY Left   . LEG SURGERY Left      OB History    Gravida  2   Para  2   Term  2   Preterm      AB      Living  2     SAB      TAB      Ectopic      Multiple  0   Live Births  2            Home Medications    Prior to Admission medications   Medication Sig Start Date End Date Taking? Authorizing Provider  escitalopram (LEXAPRO) 20 MG tablet Take 0.5 tablets (10 mg total) by mouth daily for 7 days. 10/12/18 10/19/18  Loman Brooklyn, FNP  ibuprofen (ADVIL) 600 MG tablet Take 1 tablet (600 mg total) by mouth every 8 (eight) hours as needed. 10/12/18   Loman Brooklyn, FNP  medroxyPROGESTERone Acetate 150 MG/ML SUSY Inject 1 mL (150 mg total) into the muscle every 3 (three) months. 07/20/18   Estill Dooms, NP  PARoxetine (PAXIL) 20 MG tablet Take 1 tablet (20 mg  total) by mouth daily. 10/12/18   Loman Brooklyn, FNP  traZODone (DESYREL) 50 MG tablet 25-50 mg at night for insomnia 12/30/17   Norman Clay, MD    Family History Family History  Problem Relation Age of Onset  . Depression Mother   . Asthma Sister   . Asthma Sister   . Depression Maternal Grandmother   . Hypertension Maternal Grandfather   . Other Maternal Grandfather        blood clots    Social History Social History   Tobacco Use  . Smoking status: Current Every Day Smoker    Years: 2.00    Types: Cigarettes  . Smokeless tobacco: Never Used  Substance Use Topics  . Alcohol use: Yes    Comment: occ  . Drug use: Not Currently    Types: Marijuana    Comment: used in the past     Allergies   Other   Review of Systems Review of Systems  Musculoskeletal: Positive for arthralgias. Negative for joint swelling.  Neurological: Negative for headaches.     Physical Exam Updated Vital Signs BP 112/83 (BP Location:  Left Arm)   Pulse 80   Temp 98.7 F (37.1 C) (Oral)   Resp 18   Ht 1.676 m (5\' 6" )   Wt 111.1 kg   SpO2 100%   BMI 39.54 kg/m   Physical Exam CONSTITUTIONAL: Well developed/well nourished HEAD: Normocephalic/atraumatic EYES: EOMI ENMT: Mucous membranes moist NECK: supple no meningeal signs LUNGS:  no apparent distress ABDOMEN: soft NEURO: Pt is awake/alert/appropriate, moves all extremitiesx4.  No facial droop.   EXTREMITIES: pulses normal/equal, full ROM, tenderness to right snuffbox.  Diffuse tenderness to right wrist.  No deformities.  No swelling.  No bruising. No other signs of extremity trauma SKIN: warm, color normal PSYCH: no abnormalities of mood noted, alert and oriented to situation  ED Treatments / Results  Labs (all labs ordered are listed, but only abnormal results are displayed) Labs Reviewed - No data to display  EKG None  Radiology Dg Hand Complete Right  Result Date: 11/09/2018 CLINICAL DATA:  Fall, right hand  pain EXAM: RIGHT HAND - COMPLETE 3+ VIEW COMPARISON:  Radiograph 08/22/2015 FINDINGS: There is no evidence of fracture or dislocation. Mild spurring of the fifth PIP. There is no other significant arthropathy or other focal bone abnormality. Soft tissues are unremarkable. IMPRESSION: Mild degenerative spurring of the fifth PIP. No acute osseous abnormality. Electronically Signed   By: Lovena Le M.D.   On: 11/09/2018 22:42    Procedures Procedures  Medications Ordered in ED Medications  ibuprofen (ADVIL) tablet 400 mg (400 mg Oral Given 11/10/18 0108)     Initial Impression / Assessment and Plan / ED Course  I have reviewed the triage vital signs and the nursing notes.  Pertinent  imaging results that were available during my care of the patient were reviewed by me and considered in my medical decision making (see chart for details).        Due to location of pain and snuffbox tenderness, offered splint thumb spica.  Patient declines but she will accept a Velcro splint.  Advised to keep this on for several days and if improves she can remove it.  However pain continues with neck several days she would need to have a repeat x-ray with orthopedics.  I advised that there could be an occult fracture that is missed on initial x-rays  Patient is requesting ibuprofen only for pain.  Final Clinical Impressions(s) / ED Diagnoses   Final diagnoses:  Sprain of right wrist, initial encounter    ED Discharge Orders    None       Ripley Fraise, MD 11/10/18 [phone]

## 2018-11-11 NOTE — Telephone Encounter (Signed)
Do we know anything else regarding this patient's MRI?

## 2018-11-11 NOTE — Telephone Encounter (Signed)
Still in review with insurance after sending over clinical information.

## 2018-11-17 NOTE — Telephone Encounter (Signed)
Any news? 

## 2018-11-21 ENCOUNTER — Telehealth: Payer: Self-pay | Admitting: Obstetrics & Gynecology

## 2018-11-21 NOTE — Telephone Encounter (Signed)

## 2018-11-22 ENCOUNTER — Ambulatory Visit (INDEPENDENT_AMBULATORY_CARE_PROVIDER_SITE_OTHER): Payer: Medicaid Other

## 2018-11-22 ENCOUNTER — Ambulatory Visit: Payer: Medicaid Other

## 2018-11-22 ENCOUNTER — Other Ambulatory Visit: Payer: Self-pay

## 2018-11-22 DIAGNOSIS — Z3042 Encounter for surveillance of injectable contraceptive: Secondary | ICD-10-CM | POA: Diagnosis not present

## 2018-11-22 MED ORDER — MEDROXYPROGESTERONE ACETATE 150 MG/ML IM SUSP
150.0000 mg | Freq: Once | INTRAMUSCULAR | Status: AC
  Administered 2018-11-22: 150 mg via INTRAMUSCULAR

## 2018-11-22 NOTE — Telephone Encounter (Signed)
I sent you an inbox message about this patient's mri.

## 2018-11-22 NOTE — Progress Notes (Signed)
   NURSE VISIT- INJECTION  SUBJECTIVE:  Tiffany [name] is a 28 y.o. G20P2002 female here for a Depo Provera for contraception/period management. She is a GYN patient.   OBJECTIVE:  There were no vitals taken for this visit.  Appears well, in no apparent distress  Injection administered in: Left deltoid  Meds ordered this encounter  Medications  . medroxyPROGESTERone (DEPO-PROVERA) injection 150 mg    ASSESSMENT: GYN patient Depo Provera for contraception/period management  PLAN: Follow-up: in 11-13 weeks for next Depo   Ladonna Snide  11/22/2018 9:45 AM

## 2018-11-23 ENCOUNTER — Encounter: Payer: Self-pay | Admitting: Family Medicine

## 2018-11-23 ENCOUNTER — Ambulatory Visit (INDEPENDENT_AMBULATORY_CARE_PROVIDER_SITE_OTHER): Payer: Medicaid Other | Admitting: Family Medicine

## 2018-11-23 DIAGNOSIS — F331 Major depressive disorder, recurrent, moderate: Secondary | ICD-10-CM | POA: Diagnosis not present

## 2018-11-23 DIAGNOSIS — G8929 Other chronic pain: Secondary | ICD-10-CM | POA: Diagnosis not present

## 2018-11-23 DIAGNOSIS — M5441 Lumbago with sciatica, right side: Secondary | ICD-10-CM

## 2018-11-23 DIAGNOSIS — M5442 Lumbago with sciatica, left side: Secondary | ICD-10-CM

## 2018-11-23 DIAGNOSIS — F419 Anxiety disorder, unspecified: Secondary | ICD-10-CM

## 2018-11-23 NOTE — Progress Notes (Signed)
Virtual Visit via Telephone Note  I connected with Tiffany [name] on 11/23/18 at 2:35 PM by telephone and verified that I am speaking with the correct person using two identifiers. Tiffany [name] is currently located in your car and her children are currently with her during this visit, which she is okay with. The provider, Loman Brooklyn, FNP is located in their office at time of visit.  I discussed the limitations, risks, security and privacy concerns of performing an evaluation and management service by telephone and the availability of in person appointments. I also discussed with the patient that there may be a patient responsible charge related to this service. The patient expressed understanding and agreed to proceed.  Subjective: PCP: Loman Brooklyn, FNP  Chief Complaint  Patient presents with  . Anxiety  . Depression  . Back Pain   Patient reports her back pain is "a little" better. The pain is radiating down both legs but worse on right side. She has been doing provided exercises every other day as pain allows. She is describing pain as sharp and rating it 8/10 on average. She would like to proceed with MRI and referral.   Patient reports the Paxil does help with her moods. It does not make her feel sleepy and drowsy. She would like to stay on this dosage a little longer before increasing.   Depression screen [facility] 2/9 11/23/2018 10/12/2018 10/12/2018  Decreased Interest 1 2 0  Down, Depressed, Hopeless 0 3 1  PHQ - 2 Score 1 5 1   Altered sleeping 0 0 -  Tired, decreased energy 3 3 -  Change in appetite 0 0 -  Feeling bad or failure about yourself  1 2 -  Trouble concentrating 0 0 -  Moving slowly or fidgety/restless 0 0 -  Suicidal thoughts 0 0 -  PHQ-9 Score 5 10 -  Difficult doing work/chores Not difficult at all Somewhat difficult -   GAD 7 : Generalized Anxiety Score 11/23/2018 10/12/2018  Nervous, Anxious, on Edge 3 1  Control/stop worrying 3 3  Worry too much -  different things 3 3  Trouble relaxing 3 1  Restless 0 1  Easily annoyed or irritable 1 3  Afraid - awful might happen 1 0  Total GAD 7 Score 14 12  Anxiety Difficulty Somewhat difficult Very difficult    ROS: Per HPI  Current Outpatient Medications:  .  escitalopram (LEXAPRO) 20 MG tablet, Take 0.5 tablets (10 mg total) by mouth daily for 7 days., Disp: 7 tablet, Rfl: 0 .  ibuprofen (ADVIL) 600 MG tablet, Take 1 tablet (600 mg total) by mouth every 8 (eight) hours as needed., Disp: 30 tablet, Rfl: 2 .  medroxyPROGESTERone Acetate 150 MG/ML SUSY, Inject 1 mL (150 mg total) into the muscle every 3 (three) months., Disp: 1 mL, Rfl: 4 .  PARoxetine (PAXIL) 20 MG tablet, Take 1 tablet (20 mg total) by mouth daily., Disp: 30 tablet, Rfl: 2 .  traZODone (DESYREL) 50 MG tablet, 25-50 mg at night for insomnia, Disp: 90 tablet, Rfl: 0  Allergies  Allergen Reactions  . Other     Coconut   Past Medical History:  Diagnosis Date  . Anxiety   . Chronic back pain   . Depression     Observations/Objective: A&O  No respiratory distress or wheezing audible over the phone Mood, judgement, and thought processes all WNL  Assessment and Plan: 1. Chronic right-sided low back pain with bilateral sciatica -  Continue current treatment plan with exercises that have been provided, Tylenol/Ibuprofen.  - Ambulatory referral to Neurosurgery - MR [place]; Future  2. MDD (major depressive disorder), recurrent episode, moderate ([facility]) - Continue current treatment regimen.   3. Anxiety - Continue current treatment regimen.    Follow Up Instructions:  Return in about 2 months (around 01/23/2019) for anxiety/depression.  I discussed the assessment and treatment plan with the patient. The patient was provided an opportunity to ask questions and all were answered. The patient agreed with the plan and demonstrated an understanding of the instructions.   The patient was advised to call  back or seek an in-person evaluation if the symptoms worsen or if the condition fails to improve as anticipated.  The above assessment and management plan was discussed with the patient. The patient verbalized understanding of and has agreed to the management plan. Patient is aware to call the clinic if symptoms persist or worsen. Patient is aware when to return to the clinic for a follow-up visit. Patient educated on when it is appropriate to go to the emergency department.   Time call ended: 2:53  I provided 20 minutes of non-face-to-face time during this encounter.  Hendricks Limes, MSN, APRN, FNP-C [facility] 11/23/18

## 2018-12-09 ENCOUNTER — Emergency Department
Admission: EM | Admit: 2018-12-09 | Discharge: 2018-12-09 | Disposition: A | Discharge status: Other Acute Inpatient Hospital | Payer: Medicaid Other

## 2018-12-26 ENCOUNTER — Ambulatory Visit (HOSPITAL_COMMUNITY): Payer: Medicaid Other

## 2019-01-17 ENCOUNTER — Ambulatory Visit (INDEPENDENT_AMBULATORY_CARE_PROVIDER_SITE_OTHER): Payer: Medicaid Other | Admitting: Family Medicine

## 2019-01-17 DIAGNOSIS — G8929 Other chronic pain: Secondary | ICD-10-CM

## 2019-01-17 DIAGNOSIS — M5441 Lumbago with sciatica, right side: Secondary | ICD-10-CM

## 2019-01-17 NOTE — Progress Notes (Signed)
Virtual Visit via Telephone Note  I connected with Tiffany [name] on 01/23/19 at 11:40 AM by telephone and verified that I am speaking with the correct person using two identifiers. Tiffany [name] is currently located at home and nobody is currently with her during this visit. The provider, Loman Brooklyn, FNP is located in their office at time of visit.  I discussed the limitations, risks, security and privacy concerns of performing an evaluation and management service by telephone and the availability of in person appointments. I also discussed with the patient that there may be a patient responsible charge related to this service. The patient expressed understanding and agreed to proceed.  Subjective: PCP: Loman Brooklyn, FNP  Chief Complaint  Patient presents with  . Letter for School/Work   Patient reports she needs a note to give to her apartment manager so that she can get an apartment on the ground floor due to her back pain.  Patient reports it is hard for her going up and down the stairs to her apartment and she is moving regardless due to one of her neighbors.  Patient has not yet had an appointment with [facility] although the referral was placed on 11/25/2018.  She has not called them or [place] to find out why she does not yet have an appointment.  She also did not have her MRI completed that was scheduled on 12/26/2018 at 1:00 PM as she reports she did not know about it.  Per our notes our referral coordinator spoke with patient and "she is AWARE of date/time/location of appt".   ROS: Per HPI  Current Outpatient Medications:  .  ibuprofen (ADVIL) 600 MG tablet, Take 1 tablet (600 mg total) by mouth every 8 (eight) hours as needed., Disp: 30 tablet, Rfl: 2 .  medroxyPROGESTERone Acetate 150 MG/ML SUSY, Inject 1 mL (150 mg total) into the muscle every 3 (three) months., Disp: 1 mL, Rfl: 4 .  PARoxetine (PAXIL) 20 MG tablet, Take 1 tablet (20 mg total) by mouth  daily., Disp: 30 tablet, Rfl: 2 .  traZODone (DESYREL) 50 MG tablet, 25-50 mg at night for insomnia, Disp: 90 tablet, Rfl: 0  Allergies  Allergen Reactions  . Other     Coconut   Past Medical History:  Diagnosis Date  . Anxiety   . Chronic back pain   . Depression     Observations/Objective: A&O  No respiratory distress or wheezing audible over the phone Mood, judgement, and thought processes all WNL  Assessment and Plan: 1. Chronic right-sided low back pain with right-sided sciatica Advised patient to call [facility] and schedule her appointment with the neurosurgeon.  Also discussed that we would reschedule her MRI and let her know of her next appointment.  I did provide patient with a note indicating that she has low back pain and that a MRI and referral have been ordered to further work-up her pain.  Follow Up Instructions:  I discussed the assessment and treatment plan with the patient. The patient was provided an opportunity to ask questions and all were answered. The patient agreed with the plan and demonstrated an understanding of the instructions.   The patient was advised to call back or seek an in-person evaluation if the symptoms worsen or if the condition fails to improve as anticipated.  The above assessment and management plan was discussed with the patient. The patient verbalized understanding of and has agreed to the management  plan. Patient is aware to call the clinic if symptoms persist or worsen. Patient is aware when to return to the clinic for a follow-up visit. Patient educated on when it is appropriate to go to the emergency department.   Time call ended: 11:50 AM  I provided 12 minutes of non-face-to-face time during this encounter.  Hendricks Limes, MSN, APRN, FNP-C [facility] 01/23/19

## 2019-01-23 ENCOUNTER — Encounter: Payer: Self-pay | Admitting: Family Medicine

## 2019-02-01 ENCOUNTER — Telehealth: Payer: Self-pay | Admitting: Family Medicine

## 2019-02-07 ENCOUNTER — Encounter: Payer: Self-pay | Admitting: Family Medicine

## 2019-02-08 DIAGNOSIS — M5432 Sciatica, left side: Secondary | ICD-10-CM | POA: Diagnosis not present

## 2019-02-13 ENCOUNTER — Telehealth: Payer: Self-pay | Admitting: Obstetrics & Gynecology

## 2019-02-13 NOTE — Telephone Encounter (Signed)
Called patient regarding appointment and the following message was left: ° ° °We have you scheduled for an upcoming appointment at our office. At this time, we are still not allowing visitors during the appointment, however, a support person, over age 29, may accompany you to your appointment if assistance is needed for safety or care concerns. Otherwise, support persons should remain outside until the visit is complete.  ° °We ask if you are sick, have any symptoms of COVID, have had any exposure to anyone suspected or confirmed of having COVID-19, or are awaiting test results for COVID-19, to call our office as we may need to reschedule you for a virtual visit or schedule your appointment for a later date.   ° °Please know we will ask you these questions or similar questions when you arrive for your appointment and understand this is how we are keeping everyone safe.   ° °Also,to keep you safe, please use the provided hand sanitizer when you enter the office. We are asking everyone in the office to wear a mask to help prevent the spread of °germs. If you have a mask of your own, please wear it to your appointment, if not, we are happy to provide one for you. ° °Thank you for understanding and your cooperation.  ° ° °[facility] Staff ° ° ° ° ° °

## 2019-02-14 ENCOUNTER — Ambulatory Visit: Payer: Medicaid Other

## 2019-02-21 ENCOUNTER — Other Ambulatory Visit: Payer: Self-pay

## 2019-02-21 ENCOUNTER — Ambulatory Visit (INDEPENDENT_AMBULATORY_CARE_PROVIDER_SITE_OTHER): Payer: Medicaid Other | Admitting: *Deleted

## 2019-02-21 DIAGNOSIS — Z3042 Encounter for surveillance of injectable contraceptive: Secondary | ICD-10-CM | POA: Diagnosis not present

## 2019-02-21 MED ORDER — MEDROXYPROGESTERONE ACETATE 150 MG/ML IM SUSP
150.0000 mg | Freq: Once | INTRAMUSCULAR | Status: AC
  Administered 2019-02-21: 150 mg via INTRAMUSCULAR

## 2019-02-21 NOTE — Progress Notes (Signed)
   NURSE VISIT- INJECTION  SUBJECTIVE:  Tiffany [name] is a 29 y.o. G5P2002 female here for a Depo Provera for contraception/period management. She is a GYN patient.   OBJECTIVE:  There were no vitals taken for this visit.  Appears well, in no apparent distress  Injection administered in: Right upper quad. gluteus  Meds ordered this encounter  Medications  . medroxyPROGESTERone (DEPO-PROVERA) injection 150 mg    ASSESSMENT: GYN patient Depo Provera for contraception/period management PLAN: Follow-up: in 11-13 weeks for next Depo   Alice Rieger  02/21/2019 10:30 AM

## 2019-03-09 DIAGNOSIS — M545 Low back pain: Secondary | ICD-10-CM | POA: Diagnosis not present

## 2019-03-09 DIAGNOSIS — M5432 Sciatica, left side: Secondary | ICD-10-CM | POA: Diagnosis not present

## 2019-04-03 ENCOUNTER — Encounter: Payer: Self-pay | Admitting: Nurse Practitioner

## 2019-04-03 ENCOUNTER — Ambulatory Visit (INDEPENDENT_AMBULATORY_CARE_PROVIDER_SITE_OTHER): Payer: Medicaid Other | Admitting: Nurse Practitioner

## 2019-04-03 DIAGNOSIS — N3000 Acute cystitis without hematuria: Secondary | ICD-10-CM | POA: Diagnosis not present

## 2019-04-03 DIAGNOSIS — B9689 Other specified bacterial agents as the cause of diseases classified elsewhere: Secondary | ICD-10-CM | POA: Diagnosis not present

## 2019-04-03 MED ORDER — NITROFURANTOIN MONOHYD MACRO 100 MG PO CAPS: 100.0000 mg | ORAL_CAPSULE | Freq: Two times a day (BID) | ORAL | 0 refills | Status: DC

## 2019-04-03 NOTE — Progress Notes (Signed)
   Virtual Visit via telephone Note Due to COVID-19 pandemic this visit was conducted virtually. This visit type was conducted due to national recommendations for restrictions regarding the COVID-19 Pandemic (e.g. social distancing, sheltering in place) in an effort to limit this patient's exposure and mitigate transmission in our community. All issues noted in this document were discussed and addressed.  A physical exam was not performed with this format.  I connected with Tiffany [name] on 04/03/19 at 4:15 by telephone and verified that I am speaking with the correct person using two identifiers. Tiffany [name] is currently located at home and no one is currently with her during visit. The provider, Mary-Margaret Hassell Done, FNP is located in their office at time of visit.  I discussed the limitations, risks, security and privacy concerns of performing an evaluation and management service by telephone and the availability of in person appointments. I also discussed with the patient that there may be a patient responsible charge related to this service. The patient expressed understanding and agreed to proceed.   History and Present Illness:   Chief Complaint: Urinary Tract Infection   HPI Patient calls in today c/o dysuria, and foul smelling urine. Started yesterday. She has some urgency and frequency.    Review of Systems  Constitutional: Negative.  Negative for chills and fever.  HENT: Negative.   Genitourinary: Positive for dysuria, frequency and urgency. Negative for flank pain and hematuria.  Musculoskeletal: Negative for back pain.  Neurological: Negative.   Psychiatric/Behavioral: Negative.   All other systems reviewed and are negative.    Observations/Objective: Alert and oriented- answers all questions appropriately No distress    Assessment and Plan: Tiffany [name] in today with chief complaint of Urinary Tract Infection   1. Acute cystitis without hematuria Take  medication as prescribe Cotton underwear Take shower not bath Cranberry juice, yogurt Force fluids AZO over the counter X2 days RTO prn  Meds ordered this encounter  Medications  . nitrofurantoin, macrocrystal-monohydrate, (MACROBID) 100 MG capsule    Sig: Take 1 capsule (100 mg total) by mouth 2 (two) times daily. 1 po BId    Dispense:  14 capsule    Refill:  0    Order Specific Question:   Supervising Provider    Answer:   Caryl Pina A [id number]      Follow Up Instructions: prn    I discussed the assessment and treatment plan with the patient. The patient was provided an opportunity to ask questions and all were answered. The patient agreed with the plan and demonstrated an understanding of the instructions.   The patient was advised to call back or seek an in-person evaluation if the symptoms worsen or if the condition fails to improve as anticipated.  The above assessment and management plan was discussed with the patient. The patient verbalized understanding of and has agreed to the management plan. Patient is aware to call the clinic if symptoms persist or worsen. Patient is aware when to return to the clinic for a follow-up visit. Patient educated on when it is appropriate to go to the emergency department.   Time call ended:  4:25  I provided 10 minutes of non-face-to-face time during this encounter.    Mary-Margaret Hassell Done, FNP

## 2019-05-15 ENCOUNTER — Telehealth: Payer: Self-pay | Admitting: Adult Health

## 2019-05-15 NOTE — Telephone Encounter (Signed)

## 2019-05-16 ENCOUNTER — Ambulatory Visit (INDEPENDENT_AMBULATORY_CARE_PROVIDER_SITE_OTHER): Payer: Medicaid Other

## 2019-05-16 ENCOUNTER — Other Ambulatory Visit: Payer: Self-pay

## 2019-05-16 DIAGNOSIS — Z3042 Encounter for surveillance of injectable contraceptive: Secondary | ICD-10-CM

## 2019-05-16 MED ORDER — MEDROXYPROGESTERONE ACETATE 150 MG/ML IM SUSP
150.0000 mg | Freq: Once | INTRAMUSCULAR | Status: AC
  Administered 2019-05-16: 11:00:00 150 mg via INTRAMUSCULAR

## 2019-05-16 NOTE — Progress Notes (Signed)
   NURSE VISIT- INJECTION  SUBJECTIVE:  Tiffany [name] is a 29 y.o. G39P2002 female here for  Depo injection  . She Gyn Patient.   OBJECTIVE:  There were no vitals taken for this visit.  Appears well, in no apparent distress  Injection administered in: Left upper quad. gluteus  Meds ordered this encounter  Medications  . medroxyPROGESTERone (DEPO-PROVERA) injection 150 mg    ASSESSMENT: GYN patient Depo Provera for contraception/period management PLAN: Follow-up: in 11-13 weeks for next Depo   Ladonna Snide  05/16/2019 10:29 AM

## 2019-05-24 DIAGNOSIS — Z23 Encounter for immunization: Secondary | ICD-10-CM | POA: Diagnosis not present

## 2019-06-21 DIAGNOSIS — Z23 Encounter for immunization: Secondary | ICD-10-CM | POA: Diagnosis not present

## 2019-08-15 ENCOUNTER — Ambulatory Visit: Payer: Medicaid Other

## 2019-08-16 ENCOUNTER — Other Ambulatory Visit: Payer: Self-pay | Admitting: Adult Health

## 2019-08-16 NOTE — Telephone Encounter (Signed)
Patient has an appt tomorrow and called about her prescription and that [facility] told her that they sent over a refill request.

## 2019-08-17 ENCOUNTER — Ambulatory Visit (INDEPENDENT_AMBULATORY_CARE_PROVIDER_SITE_OTHER): Payer: Medicaid Other | Admitting: *Deleted

## 2019-08-17 DIAGNOSIS — Z3042 Encounter for surveillance of injectable contraceptive: Secondary | ICD-10-CM

## 2019-08-17 DIAGNOSIS — Z3202 Encounter for pregnancy test, result negative: Secondary | ICD-10-CM

## 2019-08-17 LAB — POCT URINE PREGNANCY: Preg Test, Ur: NEGATIVE

## 2019-08-17 MED ORDER — MEDROXYPROGESTERONE ACETATE 150 MG/ML IM SUSP
150.0000 mg | Freq: Once | INTRAMUSCULAR | Status: AC
  Administered 2019-08-17: 150 mg via INTRAMUSCULAR

## 2019-08-17 NOTE — Progress Notes (Signed)
   NURSE VISIT- INJECTION  SUBJECTIVE:  Tiffany [name] is a 29 y.o. G50P2002 female here for a Depo Provera for contraception/period management. She is a GYN patient.   OBJECTIVE:  There were no vitals taken for this visit.  Appears well, in no apparent distress  Injection administered in: Right upper quad. gluteus  No orders of the defined types were placed in this encounter.   ASSESSMENT: GYN patient Depo Provera for contraception/period management PLAN: Follow-up: in 11-13 weeks for next Depo   Janece Canterbury  08/17/2019 10:56 AM

## 2019-08-25 DIAGNOSIS — M79641 Pain in right hand: Secondary | ICD-10-CM | POA: Diagnosis not present

## 2019-10-30 DIAGNOSIS — F419 Anxiety disorder, unspecified: Secondary | ICD-10-CM | POA: Diagnosis not present

## 2019-10-30 DIAGNOSIS — R0602 Shortness of breath: Secondary | ICD-10-CM | POA: Diagnosis not present

## 2019-10-30 DIAGNOSIS — F32A Depression, unspecified: Secondary | ICD-10-CM | POA: Diagnosis not present

## 2019-10-30 DIAGNOSIS — R079 Chest pain, unspecified: Secondary | ICD-10-CM | POA: Diagnosis not present

## 2019-11-02 ENCOUNTER — Other Ambulatory Visit: Payer: Self-pay

## 2019-11-02 ENCOUNTER — Ambulatory Visit: Payer: Medicaid Other

## 2019-11-02 ENCOUNTER — Encounter: Payer: Self-pay | Admitting: *Deleted

## 2019-11-02 ENCOUNTER — Ambulatory Visit (INDEPENDENT_AMBULATORY_CARE_PROVIDER_SITE_OTHER): Payer: Medicaid Other | Admitting: *Deleted

## 2019-11-02 DIAGNOSIS — Z3042 Encounter for surveillance of injectable contraceptive: Secondary | ICD-10-CM

## 2019-11-02 MED ORDER — MEDROXYPROGESTERONE ACETATE 150 MG/ML IM SUSP
150.0000 mg | Freq: Once | INTRAMUSCULAR | Status: AC
  Administered 2019-11-02: 150 mg via INTRAMUSCULAR

## 2019-11-02 NOTE — Progress Notes (Signed)
   NURSE VISIT- INJECTION  SUBJECTIVE:  Tiffany [name] is a 29 y.o. G29P2002 female here for a Depo Provera for contraception/period management. She is a GYN patient.   OBJECTIVE:  There were no vitals taken for this visit.  Appears well, in no apparent distress  Injection administered in: Left upper quad. gluteus  Meds ordered this encounter  Medications  . medroxyPROGESTERone (DEPO-PROVERA) injection 150 mg    ASSESSMENT:   Depo Provera for contraception/period management PLAN: Follow-up: in 11-13 weeks for next Depo   Alice Rieger  11/02/2019 10:20 AM

## 2019-11-07 DIAGNOSIS — R112 Nausea with vomiting, unspecified: Secondary | ICD-10-CM | POA: Diagnosis not present

## 2019-11-07 DIAGNOSIS — A084 Viral intestinal infection, unspecified: Secondary | ICD-10-CM | POA: Diagnosis not present

## 2019-12-29 ENCOUNTER — Ambulatory Visit (INDEPENDENT_AMBULATORY_CARE_PROVIDER_SITE_OTHER): Payer: Medicaid Other | Admitting: Obstetrics & Gynecology

## 2019-12-29 ENCOUNTER — Encounter: Payer: Self-pay | Admitting: Obstetrics & Gynecology

## 2019-12-29 ENCOUNTER — Other Ambulatory Visit: Payer: Self-pay

## 2019-12-29 VITALS — BP 125/82 | HR 93 | Ht 67.0 in | Wt 246.0 lb

## 2019-12-29 DIAGNOSIS — Z302 Encounter for sterilization: Secondary | ICD-10-CM | POA: Diagnosis not present

## 2019-12-29 NOTE — Progress Notes (Signed)
Preoperative History and Physical  Tiffany [name] is a 29 y.o. L2G4010 with No LMP recorded. Patient has had an injection. admitted for a laparoscopic tubal ligation..  We discussed various techniques, including electrocautery, Fallope rings, Filshie clips and bilateral salpingectomy.  Patient opts for bilateral salpingectomy due to almost zero failure rate and the 25-40% lifetime reduction in ovarian cancer.   PMH:    Past Medical History:  Diagnosis Date  . Anxiety   . Chronic back pain   . Depression     PSH:     Past Surgical History:  Procedure Laterality Date  . EXTERNAL EAR SURGERY Left   . LEG SURGERY Left     POb/GynH:      OB History    Gravida  2   Para  2   Term  2   Preterm      AB      Living  2     SAB      TAB      Ectopic      Multiple  0   Live Births  2           SH:   Social History   Tobacco Use  . Smoking status: Current Every Day Smoker    Years: 2.00    Types: Cigarettes  . Smokeless tobacco: Never Used  Vaping Use  . Vaping Use: Never used  Substance Use Topics  . Alcohol use: Yes    Comment: occ  . Drug use: Not Currently    Types: Marijuana    Comment: used in the past    FH:    Family History  Problem Relation Age of Onset  . Depression Mother   . Asthma Sister   . Asthma Sister   . Depression Maternal Grandmother   . Hypertension Maternal Grandfather   . Other Maternal Grandfather        blood clots     Allergies:  Allergies  Allergen Reactions  . Other     Coconut    Medications:       Current Outpatient Medications:  .  medroxyPROGESTERone Acetate 150 MG/ML SUSY, INJECT 1 ML INTO THE MUSCLE EVERY 3 MONTHS, Disp: 1 mL, Rfl: 1 .  PARoxetine (PAXIL) 20 MG tablet, Take 1 tablet (20 mg total) by mouth daily., Disp: 30 tablet, Rfl: 2 .  traZODone (DESYREL) 50 MG tablet, 25-50 mg at night for insomnia, Disp: 90 tablet, Rfl: 0  Review of Systems:   Review of Systems  Constitutional: Negative for  fever, chills, weight loss, malaise/fatigue and diaphoresis.  HENT: Negative for hearing loss, ear pain, nosebleeds, congestion, sore throat, neck pain, tinnitus and ear discharge.   Eyes: Negative for blurred vision, double vision, photophobia, pain, discharge and redness.  Respiratory: Negative for cough, hemoptysis, sputum production, shortness of breath, wheezing and stridor.   Cardiovascular: Negative for chest pain, palpitations, orthopnea, claudication, leg swelling and PND.  Gastrointestinal: Positive for abdominal pain. Negative for heartburn, nausea, vomiting, diarrhea, constipation, blood in stool and melena.  Genitourinary: Negative for dysuria, urgency, frequency, hematuria and flank pain.  Musculoskeletal: Negative for myalgias, back pain, joint pain and falls.  Skin: Negative for itching and rash.  Neurological: Negative for dizziness, tingling, tremors, sensory change, speech change, focal weakness, seizures, loss of consciousness, weakness and headaches.  Endo/Heme/Allergies: Negative for environmental allergies and polydipsia. Does not bruise/bleed easily.  Psychiatric/Behavioral: Negative for depression, suicidal ideas, hallucinations, memory loss and substance abuse. The patient is  not nervous/anxious and does not have insomnia.      PHYSICAL EXAM:  Blood pressure 125/82, pulse 93, height 5\' 7"  (1.702 m), weight 246 lb (111.6 kg).    Vitals reviewed. Constitutional: She is oriented to person, place, and time. She appears well-developed and well-nourished.  HENT:  Head: Normocephalic and atraumatic.  Right Ear: External ear normal.  Left Ear: External ear normal.  Nose: Nose normal.  Mouth/Throat: Oropharynx is clear and moist.  Eyes: Conjunctivae and EOM are normal. Pupils are equal, round, and reactive to light. Right eye exhibits no discharge. Left eye exhibits no discharge. No scleral icterus.  Neck: Normal range of motion. Neck supple. No tracheal deviation  present. No thyromegaly present.  Cardiovascular: Normal rate, regular rhythm, normal heart sounds and intact distal pulses.  Exam reveals no gallop and no friction rub.   No murmur heard. Respiratory: Effort normal and breath sounds normal. No respiratory distress. She has no wheezes. She has no rales. She exhibits no tenderness.  GI: Soft. Bowel sounds are normal. She exhibits no distension and no mass. There is tenderness. There is no rebound and no guarding.  Genitourinary:       Vulva is normal without lesions Vagina is pink moist without discharge Cervix normal in appearance and pap is normal Uterus is normal size, contour, position, consistency, mobility, non-tender Adnexa is negative with normal sized ovaries by sonogram  Musculoskeletal: Normal range of motion. She exhibits no edema and no tenderness.  Neurological: She is alert and oriented to person, place, and time. She has normal reflexes. She displays normal reflexes. No cranial nerve deficit. She exhibits normal muscle tone. Coordination normal.  Skin: Skin is warm and dry. No rash noted. No erythema. No pallor.  Psychiatric: She has a normal mood and affect. Her behavior is normal. Judgment and thought content normal.    Labs: No results found for this or any previous visit (from the past 336 hour(s)).  EKG: No orders found for this or any previous visit.  Imaging Studies: No results found.    Assessment: Multiparous female desires permanent sterilization, opts for bilateral salpingectomy as specific technique   Patient Active Problem List   Diagnosis Date Noted  . MDD (major depressive disorder), recurrent episode, moderate ([place]) 10/12/2018  . Chronic right-sided low back pain with right-sided sciatica 10/12/2018  . Anxiety 10/05/2017    Plan: Laparoscopic sterilization request, patient opts for bilateral salpingectomy  Florian Buff 12/29/2019 10:10 AM

## 2020-01-10 DIAGNOSIS — B373 Candidiasis of vulva and vagina: Secondary | ICD-10-CM | POA: Diagnosis not present

## 2020-01-10 DIAGNOSIS — N3001 Acute cystitis with hematuria: Secondary | ICD-10-CM | POA: Diagnosis not present

## 2020-01-10 DIAGNOSIS — N898 Other specified noninflammatory disorders of vagina: Secondary | ICD-10-CM | POA: Diagnosis not present

## 2020-01-17 ENCOUNTER — Other Ambulatory Visit: Payer: Medicaid Other

## 2020-01-25 ENCOUNTER — Ambulatory Visit: Payer: Medicaid Other

## 2020-02-02 ENCOUNTER — Other Ambulatory Visit: Payer: Self-pay | Admitting: Obstetrics & Gynecology

## 2020-02-02 NOTE — Patient Instructions (Signed)
Tiffany [name]  02/02/2020     @PREFPERIOPPHARMACY @   Your procedure is scheduled on  02/07/2020.  Report to 04/06/2020 at  0830  A.M.  Call this number if you have problems the morning of surgery:  [phone]   Remember:  Do not eat or drink after midnight.                          Take these medicines the morning of surgery with A SIP OF WATER None    Do not wear jewelry, make-up or nail polish.  Do not wear lotions, powders, or perfumes, or deodorant. Please brush your teeth  Do not shave 48 hours prior to surgery.  Men may shave face and neck.  Do not bring valuables to the hospital.  [facility] is not responsible for any belongings or valuables.  Contacts, dentures or bridgework may not be worn into surgery.  Leave your suitcase in the car.  After surgery it may be brought to your room.  For patients admitted to the hospital, discharge time will be determined by your treatment team.  Patients discharged the day of surgery will not be allowed to drive home.   Name and phone number of your driver:   family Special instructions:  DO NOT smoke the morning of your procedure.  Please read over the following fact sheets that you were given. Anesthesia Post-op Instructions and Care and Recovery After Surgery       Salpingectomy, Care After This sheet gives you information about how to care for yourself after your procedure. Your health care provider may also give you more specific instructions. If you have problems or questions, contact your health care provider. What can I expect after the procedure? After the procedure, it is common to have:  Pain in your abdomen.  Some light vaginal bleeding (spotting) for a few days.  Tiredness. Your recovery time will vary depending on which method your surgeon used for your surgery. Follow these instructions at home: Incision care   Follow instructions from your health care provider about how to take care of  your incisions. Make sure you: ? Wash your hands with soap and water before and after you change your bandage (dressing). If soap and water are not available, use hand sanitizer. ? Change or remove your dressing as told by your health care provider. ? Leave any stitches (sutures), skin glue, or adhesive strips in place. These skin closures may need to stay in place for 2 weeks or longer. If adhesive strip edges start to loosen and curl up, you may trim the loose edges. Do not remove adhesive strips completely unless your health care provider tells you to do that.  Keep your dressing clean and dry.  Check your incision area every day for signs of infection. Check for: ? Redness, swelling, or pain that gets worse. ? Fluid or blood. ? Warmth. ? Pus or a bad smell. Activity  Rest as told by your health care provider.  Avoid sitting for a long time without moving. Get up to take short walks every 1-2 hours. This is important to improve blood flow and breathing. Ask for help if you feel weak or unsteady.  Return to your normal activities as told by your health care provider. Ask your health care provider what activities are safe for you.  Do not drive until your health care provider says that  it is safe.  Do not lift anything that is heavier than 10 lb (4.5 kg), or the limit that you are told, until your health care provider says that it is safe. This may be 2-6 weeks depending on your surgery.  Until your health care provider approves: ? Do not douche. ? Do not use tampons. ? Do not have sex. Medicines  Take over-the-counter and prescription medicines only as told by your health care provider.  Ask your health care provider if the medicine prescribed to you: ? Requires you to avoid driving or using heavy machinery. ? Can cause constipation. You may need to take actions to prevent or treat constipation, such as:  Drink enough fluid to keep your urine pale yellow.  Take  over-the-counter or prescription medicines.  Eat foods that are high in fiber, such as beans, whole grains, and fresh fruits and vegetables.  Limit foods that are high in fat and processed sugars, such as fried or sweet foods. General instructions  Wear compression stockings as told by your health care provider. These stockings help to prevent blood clots and reduce swelling in your legs.  Do not use any products that contain nicotine or tobacco, such as cigarettes, e-cigarettes, and chewing tobacco. If you need help quitting, ask your health care provider.  Do not take baths, swim, or use a hot tub until your health care provider approves. You may take showers.  Keep all follow-up visits as told by your health care provider. This is important. Contact a health care provider if you have:  Pain when you urinate.  Redness, swelling, or pain around an incision.  Fluid or blood coming from an incision.  Pus or a bad smell coming from an incision.  An incision that feels warm to the touch.  A fever.  Abdominal pain that gets worse or does not get better with medicine.  An incision that starts to break open.  A rash.  Light-headedness.  Nausea and vomiting. Get help right away if you:  Have pain in your chest or leg.  Develop shortness of breath.  Faint.  Have increased or heavy vaginal bleeding, such as soaking a pad in an hour. Summary  After the procedure, it is common to feel tired, have some pain in your abdomen, and have some light vaginal bleeding for a few days.  Follow instructions from your health care provider about how to take care of your incisions.  Return to your normal activities as told by your health care provider. Ask your health care provider what activities are safe for you.  Do not douche, use tampons, or have sex until your health care provider approves.  Keep all follow-up visits as told by your health care provider. This information is not  intended to replace advice given to you by your health care provider. Make sure you discuss any questions you have with your health care provider. Document Revised: 01/03/2018 Document Reviewed: 01/03/2018 Elsevier Patient Education  2020 [employer]  General Anesthesia, Adult, Care After This sheet gives you information about how to care for yourself after your procedure. Your health care provider may also give you more specific instructions. If you have problems or questions, contact your health care provider. What can I expect after the procedure? After the procedure, the following side effects are common:  Pain or discomfort at the IV site.  Nausea.  Vomiting.  Sore throat.  Trouble concentrating.  Feeling cold or chills.  Weak or tired.  Sleepiness  and fatigue.  Soreness and body aches. These side effects can affect parts of the body that were not involved in surgery. Follow these instructions at home:  For at least 24 hours after the procedure:  Have a responsible adult stay with you. It is important to have someone help care for you until you are awake and alert.  Rest as needed.  Do not: ? Participate in activities in which you could fall or become injured. ? Drive. ? Use heavy machinery. ? Drink alcohol. ? Take sleeping pills or medicines that cause drowsiness. ? Make important decisions or sign legal documents. ? Take care of children on your own. Eating and drinking  Follow any instructions from your health care provider about eating or drinking restrictions.  When you feel hungry, start by eating small amounts of foods that are soft and easy to digest (bland), such as toast. Gradually return to your regular diet.  Drink enough fluid to keep your urine pale yellow.  If you vomit, rehydrate by drinking water, juice, or clear broth. General instructions  If you have sleep apnea, surgery and certain medicines can increase your risk for breathing  problems. Follow instructions from your health care provider about wearing your sleep device: ? Anytime you are sleeping, including during daytime naps. ? While taking prescription pain medicines, sleeping medicines, or medicines that make you drowsy.  Return to your normal activities as told by your health care provider. Ask your health care provider what activities are safe for you.  Take over-the-counter and prescription medicines only as told by your health care provider.  If you smoke, do not smoke without supervision.  Keep all follow-up visits as told by your health care provider. This is important. Contact a health care provider if:  You have nausea or vomiting that does not get better with medicine.  You cannot eat or drink without vomiting.  You have pain that does not get better with medicine.  You are unable to pass urine.  You develop a skin rash.  You have a fever.  You have redness around your IV site that gets worse. Get help right away if:  You have difficulty breathing.  You have chest pain.  You have blood in your urine or stool, or you vomit blood. Summary  After the procedure, it is common to have a sore throat or nausea. It is also common to feel tired.  Have a responsible adult stay with you for the first 24 hours after general anesthesia. It is important to have someone help care for you until you are awake and alert.  When you feel hungry, start by eating small amounts of foods that are soft and easy to digest (bland), such as toast. Gradually return to your regular diet.  Drink enough fluid to keep your urine pale yellow.  Return to your normal activities as told by your health care provider. Ask your health care provider what activities are safe for you. This information is not intended to replace advice given to you by your health care provider. Make sure you discuss any questions you have with your health care provider. Document Revised:  01/15/2017 Document Reviewed: 08/28/2016 Elsevier Patient Education  [place]. How to Use Chlorhexidine for Bathing Chlorhexidine gluconate (CHG) is a germ-killing (antiseptic) solution that is used to clean the skin. It can get rid of the bacteria that normally live on the skin and can keep them away for about 24 hours. To clean your skin  with CHG, you may be given:  A CHG solution to use in the shower or as part of a sponge bath.  A prepackaged cloth that contains CHG. Cleaning your skin with CHG may help lower the risk for infection:  While you are staying in the intensive care unit of the hospital.  If you have a vascular access, such as a central line, to provide short-term or long-term access to your veins.  If you have a catheter to drain urine from your bladder.  If you are on a ventilator. A ventilator is a machine that helps you breathe by moving air in and out of your lungs.  After surgery. What are the risks? Risks of using CHG include:  A skin reaction.  Hearing loss, if CHG gets in your ears.  Eye injury, if CHG gets in your eyes and is not rinsed out.  The CHG product catching fire. Make sure that you avoid smoking and flames after applying CHG to your skin. Do not use CHG:  If you have a chlorhexidine allergy or have previously reacted to chlorhexidine.  On babies younger than 79 months of age. How to use CHG solution  Use CHG only as told by your health care provider, and follow the instructions on the label.  Use the full amount of CHG as directed. Usually, this is one bottle. During a shower Follow these steps when using CHG solution during a shower (unless your health care provider gives you different instructions): 1. Start the shower. 2. Use your normal soap and shampoo to wash your face and hair. 3. Turn off the shower or move out of the shower stream. 4. Pour the CHG onto a clean washcloth. Do not use any type of brush or rough-edged  sponge. 5. Starting at your neck, lather your body down to your toes. Make sure you follow these instructions: ? If you will be having surgery, pay special attention to the part of your body where you will be having surgery. Scrub this area for at least 1 minute. ? Do not use CHG on your head or face. If the solution gets into your ears or eyes, rinse them well with water. ? Avoid your genital area. ? Avoid any areas of skin that have broken skin, cuts, or scrapes. ? Scrub your back and under your arms. Make sure to wash skin folds. 6. Let the lather sit on your skin for 1-2 minutes or as long as told by your health care provider. 7. Thoroughly rinse your entire body in the shower. Make sure that all body creases and crevices are rinsed well. 8. Dry off with a clean towel. Do not put any substances on your body afterward--such as powder, lotion, or perfume--unless you are told to do so by your health care provider. Only use lotions that are recommended by the manufacturer. 9. Put on clean clothes or pajamas. 10. If it is the night before your surgery, sleep in clean sheets.  During a sponge bath Follow these steps when using CHG solution during a sponge bath (unless your health care provider gives you different instructions): 1. Use your normal soap and shampoo to wash your face and hair. 2. Pour the CHG onto a clean washcloth. 3. Starting at your neck, lather your body down to your toes. Make sure you follow these instructions: ? If you will be having surgery, pay special attention to the part of your body where you will be having surgery. Scrub this area for  at least 1 minute. ? Do not use CHG on your head or face. If the solution gets into your ears or eyes, rinse them well with water. ? Avoid your genital area. ? Avoid any areas of skin that have broken skin, cuts, or scrapes. ? Scrub your back and under your arms. Make sure to wash skin folds. 4. Let the lather sit on your skin for 1-2  minutes or as long as told by your health care provider. 5. Using a different clean, wet washcloth, thoroughly rinse your entire body. Make sure that all body creases and crevices are rinsed well. 6. Dry off with a clean towel. Do not put any substances on your body afterward--such as powder, lotion, or perfume--unless you are told to do so by your health care provider. Only use lotions that are recommended by the manufacturer. 7. Put on clean clothes or pajamas. 8. If it is the night before your surgery, sleep in clean sheets. How to use CHG prepackaged cloths  Only use CHG cloths as told by your health care provider, and follow the instructions on the label.  Use the CHG cloth on clean, dry skin.  Do not use the CHG cloth on your head or face unless your health care provider tells you to.  When washing with the CHG cloth: ? Avoid your genital area. ? Avoid any areas of skin that have broken skin, cuts, or scrapes. Before surgery Follow these steps when using a CHG cloth to clean before surgery (unless your health care provider gives you different instructions): 1. Using the CHG cloth, vigorously scrub the part of your body where you will be having surgery. Scrub using a back-and-forth motion for 3 minutes. The area on your body should be completely wet with CHG when you are done scrubbing. 2. Do not rinse. Discard the cloth and let the area air-dry. Do not put any substances on the area afterward, such as powder, lotion, or perfume. 3. Put on clean clothes or pajamas. 4. If it is the night before your surgery, sleep in clean sheets.  For general bathing Follow these steps when using CHG cloths for general bathing (unless your health care provider gives you different instructions). 1. Use a separate CHG cloth for each area of your body. Make sure you wash between any folds of skin and between your fingers and toes. Wash your body in the following order, switching to a new cloth after each  step: ? The front of your neck, shoulders, and chest. ? Both of your arms, under your arms, and your hands. ? Your stomach and groin area, avoiding the genitals. ? Your right leg and foot. ? Your left leg and foot. ? The back of your neck, your back, and your buttocks. 2. Do not rinse. Discard the cloth and let the area air-dry. Do not put any substances on your body afterward--such as powder, lotion, or perfume--unless you are told to do so by your health care provider. Only use lotions that are recommended by the manufacturer. 3. Put on clean clothes or pajamas. Contact a health care provider if:  Your skin gets irritated after scrubbing.  You have questions about using your solution or cloth. Get help right away if:  Your eyes become very red or swollen.  Your eyes itch badly.  Your skin itches badly and is red or swollen.  Your hearing changes.  You have trouble seeing.  You have swelling or tingling in your mouth or throat.  You have trouble breathing.  You swallow any chlorhexidine. Summary  Chlorhexidine gluconate (CHG) is a germ-killing (antiseptic) solution that is used to clean the skin. Cleaning your skin with CHG may help to lower your risk for infection.  You may be given CHG to use for bathing. It may be in a bottle or in a prepackaged cloth to use on your skin. Carefully follow your health care provider's instructions and the instructions on the product label.  Do not use CHG if you have a chlorhexidine allergy.  Contact your health care provider if your skin gets irritated after scrubbing. This information is not intended to replace advice given to you by your health care provider. Make sure you discuss any questions you have with your health care provider. Document Revised: 03/31/2018 Document Reviewed: 12/10/2016 Elsevier Patient Education  [place].

## 2020-02-05 ENCOUNTER — Other Ambulatory Visit (HOSPITAL_COMMUNITY)
Admission: RE | Admit: 2020-02-05 | Discharge: 2020-02-05 | Disposition: A | Discharge status: Home / Self Care | Payer: Medicaid Other | Source: Ambulatory Visit | Attending: Obstetrics & Gynecology | Admitting: Obstetrics & Gynecology

## 2020-02-05 ENCOUNTER — Encounter (HOSPITAL_COMMUNITY)
Admission: RE | Admit: 2020-02-05 | Discharge: 2020-02-05 | Disposition: A | Discharge status: Home / Self Care | Payer: Medicaid Other | Source: Ambulatory Visit | Attending: Obstetrics & Gynecology | Admitting: Obstetrics & Gynecology

## 2020-02-05 ENCOUNTER — Other Ambulatory Visit: Payer: Self-pay

## 2020-02-05 ENCOUNTER — Encounter (HOSPITAL_COMMUNITY): Payer: Self-pay

## 2020-02-05 DIAGNOSIS — U071 COVID-19: Secondary | ICD-10-CM | POA: Diagnosis not present

## 2020-02-05 DIAGNOSIS — Z01812 Encounter for preprocedural laboratory examination: Secondary | ICD-10-CM | POA: Diagnosis present

## 2020-02-05 LAB — CBC
HCT: 42.4 % (ref 36.0–46.0)
Hemoglobin: 14.4 g/dL (ref 12.0–15.0)
MCH: 26.9 pg (ref 26.0–34.0)
MCHC: 34 g/dL (ref 30.0–36.0)
MCV: 79.3 fL — ABNORMAL LOW (ref 80.0–100.0)
Platelets: 337 10*3/uL (ref 150–400)
RBC: 5.35 MIL/uL — ABNORMAL HIGH (ref 3.87–5.11)
RDW: 13.2 % (ref 11.5–15.5)
WBC: 4.2 10*3/uL (ref 4.0–10.5)
nRBC: 0 % (ref 0.0–0.2)

## 2020-02-05 LAB — COMPREHENSIVE METABOLIC PANEL
ALT: 17 U/L (ref 0–44)
AST: 13 U/L — ABNORMAL LOW (ref 15–41)
Albumin: 4 g/dL (ref 3.5–5.0)
Alkaline Phosphatase: 48 U/L (ref 38–126)
Anion gap: 8 (ref 5–15)
BUN: 14 mg/dL (ref 6–20)
CO2: 27 mmol/L (ref 22–32)
Calcium: 9.3 mg/dL (ref 8.9–10.3)
Chloride: 106 mmol/L (ref 98–111)
Creatinine, Ser: 0.77 mg/dL (ref 0.44–1.00)
GFR, Estimated: >60 mL/min (ref >60)
Glucose, Bld: 111 mg/dL — ABNORMAL HIGH (ref 70–99)
Potassium: 4 mmol/L (ref 3.5–5.1)
Sodium: 141 mmol/L (ref 135–145)
Total Bilirubin: 0.3 mg/dL (ref 0.3–1.2)
Total Protein: 7.3 g/dL (ref 6.5–8.1)

## 2020-02-05 LAB — URINALYSIS, ROUTINE W REFLEX MICROSCOPIC
Bilirubin Urine: NEGATIVE
Glucose, UA: NEGATIVE mg/dL
Hgb urine dipstick: NEGATIVE
Ketones, ur: NEGATIVE mg/dL
Leukocytes,Ua: NEGATIVE
Nitrite: NEGATIVE
Protein, ur: NEGATIVE mg/dL
Specific Gravity, Urine: 1.023 (ref 1.005–1.030)
pH: 7 (ref 5.0–8.0)

## 2020-02-05 LAB — RAPID HIV SCREEN (HIV 1/2 AB+AG)
HIV 1/2 Antibodies: NONREACTIVE
HIV-1 P24 Antigen - HIV24: NONREACTIVE

## 2020-02-05 LAB — HCG, QUANTITATIVE, PREGNANCY: hCG, Beta Chain, Quant, S: <1 m[IU]/mL (ref <5)

## 2020-02-06 ENCOUNTER — Telehealth: Payer: Self-pay | Admitting: Obstetrics & Gynecology

## 2020-02-06 LAB — SARS CORONAVIRUS 2 (TAT 6-24 HRS): SARS Coronavirus 2: POSITIVE — AB

## 2020-02-06 NOTE — Telephone Encounter (Signed)
Patient called stating that she had her Pre OP appointment at [facility] and she tested positive for COVID so she is not able to have her Surgery tomorrow. Pt would like to know when can she go back to work? Please contact pt

## 2020-02-06 NOTE — Telephone Encounter (Signed)
Called patient back. She had questions regarding quarantine. She is asymptomatic. Advised if she remains that way she will be able to return after 5 days with a mask. Also recommended that she check with her work to see if they have any policies.

## 2020-02-15 ENCOUNTER — Other Ambulatory Visit: Payer: Self-pay

## 2020-02-15 ENCOUNTER — Encounter (HOSPITAL_COMMUNITY): Admission: RE | Admit: 2020-02-15 | Payer: Medicaid Other | Source: Ambulatory Visit

## 2020-02-15 ENCOUNTER — Encounter (HOSPITAL_COMMUNITY): Payer: Self-pay | Admitting: Obstetrics & Gynecology

## 2020-02-18 ENCOUNTER — Other Ambulatory Visit: Payer: Self-pay | Admitting: Obstetrics & Gynecology

## 2020-02-19 ENCOUNTER — Encounter: Payer: Medicaid Other | Admitting: Obstetrics & Gynecology

## 2020-02-21 ENCOUNTER — Encounter (HOSPITAL_COMMUNITY): Payer: Self-pay | Admitting: Obstetrics & Gynecology

## 2020-02-21 ENCOUNTER — Ambulatory Visit (HOSPITAL_COMMUNITY): Payer: Medicaid Other | Admitting: Anesthesiology

## 2020-02-21 ENCOUNTER — Ambulatory Visit (HOSPITAL_COMMUNITY)
Admission: RE | Admit: 2020-02-21 | Discharge: 2020-02-21 | Disposition: A | Discharge status: Home / Self Care | Payer: Medicaid Other | Attending: Obstetrics & Gynecology | Admitting: Obstetrics & Gynecology

## 2020-02-21 ENCOUNTER — Encounter (HOSPITAL_COMMUNITY)
Admission: RE | Disposition: A | Discharge status: Home / Self Care | Payer: Self-pay | Source: Home / Self Care | Attending: Obstetrics & Gynecology

## 2020-02-21 DIAGNOSIS — Z4002 Encounter for prophylactic removal of ovary: Secondary | ICD-10-CM | POA: Diagnosis not present

## 2020-02-21 DIAGNOSIS — Z818 Family history of other mental and behavioral disorders: Secondary | ICD-10-CM | POA: Insufficient documentation

## 2020-02-21 DIAGNOSIS — F17210 Nicotine dependence, cigarettes, uncomplicated: Secondary | ICD-10-CM | POA: Insufficient documentation

## 2020-02-21 DIAGNOSIS — Z825 Family history of asthma and other chronic lower respiratory diseases: Secondary | ICD-10-CM | POA: Insufficient documentation

## 2020-02-21 DIAGNOSIS — Z8249 Family history of ischemic heart disease and other diseases of the circulatory system: Secondary | ICD-10-CM | POA: Diagnosis not present

## 2020-02-21 DIAGNOSIS — Z302 Encounter for sterilization: Secondary | ICD-10-CM

## 2020-02-21 HISTORY — PX: LAPAROSCOPIC BILATERAL SALPINGECTOMY: SHX5889

## 2020-02-21 LAB — PREGNANCY, URINE: Preg Test, Ur: NEGATIVE — AB

## 2020-02-21 SURGERY — SALPINGECTOMY, BILATERAL, LAPAROSCOPIC
Anesthesia: General | Laterality: Bilateral

## 2020-02-21 MED ORDER — ROCURONIUM BROMIDE 10 MG/ML (PF) SYRINGE
PREFILLED_SYRINGE | INTRAVENOUS | Status: DC | PRN
  Administered 2020-02-21: 60 mg via INTRAVENOUS

## 2020-02-21 MED ORDER — LIDOCAINE HCL (PF) 2 % IJ SOLN
INTRAMUSCULAR | Status: AC
  Filled 2020-02-21: qty 5

## 2020-02-21 MED ORDER — BUPIVACAINE LIPOSOME 1.3 % IJ SUSP
20.0000 mL | Freq: Once | INTRAMUSCULAR | Status: DC
  Filled 2020-02-21: qty 20

## 2020-02-21 MED ORDER — ONDANSETRON HCL 4 MG/2ML IJ SOLN
INTRAMUSCULAR | Status: DC | PRN
  Administered 2020-02-21: 4 mg via INTRAVENOUS

## 2020-02-21 MED ORDER — ONDANSETRON 8 MG PO TBDP: 8.0000 mg | ORAL_TABLET | Freq: Three times a day (TID) | ORAL | 0 refills | Status: DC | PRN

## 2020-02-21 MED ORDER — FENTANYL CITRATE (PF) 100 MCG/2ML IJ SOLN: 25.0000 ug | INTRAMUSCULAR | Status: DC | PRN

## 2020-02-21 MED ORDER — MIDAZOLAM HCL 5 MG/5ML IJ SOLN
INTRAMUSCULAR | Status: DC | PRN
  Administered 2020-02-21: 2 mg via INTRAVENOUS

## 2020-02-21 MED ORDER — DEXAMETHASONE SODIUM PHOSPHATE 10 MG/ML IJ SOLN
INTRAMUSCULAR | Status: AC
  Filled 2020-02-21: qty 1

## 2020-02-21 MED ORDER — CHLORHEXIDINE GLUCONATE 0.12 % MT SOLN
OROMUCOSAL | Status: AC
  Filled 2020-02-21: qty 15

## 2020-02-21 MED ORDER — KETOROLAC TROMETHAMINE 30 MG/ML IJ SOLN
INTRAMUSCULAR | Status: AC
  Filled 2020-02-21: qty 1

## 2020-02-21 MED ORDER — LIDOCAINE 2% (20 MG/ML) 5 ML SYRINGE
INTRAMUSCULAR | Status: DC | PRN
  Administered 2020-02-21: 100 mg via INTRAVENOUS

## 2020-02-21 MED ORDER — PROPOFOL 10 MG/ML IV BOLUS
INTRAVENOUS | Status: DC | PRN
  Administered 2020-02-21: 200 mg via INTRAVENOUS

## 2020-02-21 MED ORDER — SUGAMMADEX SODIUM 200 MG/2ML IV SOLN
INTRAVENOUS | Status: DC | PRN
  Administered 2020-02-21: 300 mg via INTRAVENOUS

## 2020-02-21 MED ORDER — SEVOFLURANE IN SOLN
RESPIRATORY_TRACT | Status: AC
  Filled 2020-02-21: qty 250

## 2020-02-21 MED ORDER — ONDANSETRON HCL 4 MG/2ML IJ SOLN
INTRAMUSCULAR | Status: AC
  Filled 2020-02-21: qty 2

## 2020-02-21 MED ORDER — ORAL CARE MOUTH RINSE: 15.0000 mL | Freq: Once | OROMUCOSAL | Status: AC

## 2020-02-21 MED ORDER — DEXAMETHASONE SODIUM PHOSPHATE 10 MG/ML IJ SOLN
INTRAMUSCULAR | Status: DC | PRN
  Administered 2020-02-21: 8 mg via INTRAVENOUS

## 2020-02-21 MED ORDER — KETOROLAC TROMETHAMINE 10 MG PO TABS: 10.0000 mg | ORAL_TABLET | Freq: Three times a day (TID) | ORAL | 0 refills | Status: DC | PRN

## 2020-02-21 MED ORDER — ROCURONIUM BROMIDE 10 MG/ML (PF) SYRINGE
PREFILLED_SYRINGE | INTRAVENOUS | Status: AC
  Filled 2020-02-21: qty 10

## 2020-02-21 MED ORDER — CEFAZOLIN SODIUM-DEXTROSE 2-4 GM/100ML-% IV SOLN
2.0000 g | INTRAVENOUS | Status: AC
  Administered 2020-02-21: 2 g via INTRAVENOUS
  Filled 2020-02-21: qty 100

## 2020-02-21 MED ORDER — MIDAZOLAM HCL 2 MG/2ML IJ SOLN
INTRAMUSCULAR | Status: AC
  Filled 2020-02-21: qty 2

## 2020-02-21 MED ORDER — DEXMEDETOMIDINE (PRECEDEX) IN NS 20 MCG/5ML (4 MCG/ML) IV SYRINGE
PREFILLED_SYRINGE | INTRAVENOUS | Status: DC | PRN
  Administered 2020-02-21: 20 ug via INTRAVENOUS
  Administered 2020-02-21: 10 ug via INTRAVENOUS

## 2020-02-21 MED ORDER — LACTATED RINGERS IV SOLN: INTRAVENOUS | Status: DC

## 2020-02-21 MED ORDER — BUPIVACAINE LIPOSOME 1.3 % IJ SUSP
INTRAMUSCULAR | Status: DC | PRN
  Administered 2020-02-21: 20 mL

## 2020-02-21 MED ORDER — HYDROCODONE-ACETAMINOPHEN 5-325 MG PO TABS: 1.0000 | ORAL_TABLET | Freq: Four times a day (QID) | ORAL | 0 refills | Status: DC | PRN

## 2020-02-21 MED ORDER — CHLORHEXIDINE GLUCONATE 0.12 % MT SOLN
15.0000 mL | Freq: Once | OROMUCOSAL | Status: AC
  Administered 2020-02-21: 15 mL via OROMUCOSAL

## 2020-02-21 MED ORDER — KETOROLAC TROMETHAMINE 30 MG/ML IJ SOLN
30.0000 mg | Freq: Once | INTRAMUSCULAR | Status: AC
  Administered 2020-02-21: 30 mg via INTRAVENOUS

## 2020-02-21 MED ORDER — LACTATED RINGERS IV SOLN: INTRAVENOUS | Status: DC | PRN

## 2020-02-21 MED ORDER — BUPIVACAINE LIPOSOME 1.3 % IJ SUSP
INTRAMUSCULAR | Status: AC
  Filled 2020-02-21: qty 20

## 2020-02-21 MED ORDER — SODIUM CHLORIDE 0.9 % IR SOLN
Status: DC | PRN
  Administered 2020-02-21: 1000 mL

## 2020-02-21 MED ORDER — DEXMEDETOMIDINE (PRECEDEX) IN NS 20 MCG/5ML (4 MCG/ML) IV SYRINGE
PREFILLED_SYRINGE | INTRAVENOUS | Status: AC
  Filled 2020-02-21: qty 10

## 2020-02-21 MED ORDER — FENTANYL CITRATE (PF) 250 MCG/5ML IJ SOLN
INTRAMUSCULAR | Status: AC
  Filled 2020-02-21: qty 5

## 2020-02-21 MED ORDER — FENTANYL CITRATE (PF) 100 MCG/2ML IJ SOLN
INTRAMUSCULAR | Status: DC | PRN
  Administered 2020-02-21: 50 ug via INTRAVENOUS
  Administered 2020-02-21: 100 ug via INTRAVENOUS

## 2020-02-21 MED ORDER — ONDANSETRON HCL 4 MG/2ML IJ SOLN: 4.0000 mg | Freq: Once | INTRAMUSCULAR | Status: DC | PRN

## 2020-02-21 SURGICAL SUPPLY — 38 items
ADH SKN CLS APL DERMABOND .7 (GAUZE/BANDAGES/DRESSINGS) ×1
BAG HAMPER (MISCELLANEOUS) ×2 IMPLANT
BLADE SURG SZ11 CARB STEEL (BLADE) ×2 IMPLANT
CLOTH BEACON ORANGE TIMEOUT ST (SAFETY) ×2 IMPLANT
COVER LIGHT HANDLE STERIS (MISCELLANEOUS) ×4 IMPLANT
COVER WAND RF STERILE (DRAPES) ×2 IMPLANT
DERMABOND ADVANCED (GAUZE/BANDAGES/DRESSINGS) ×1
DERMABOND ADVANCED .7 DNX12 (GAUZE/BANDAGES/DRESSINGS) ×1 IMPLANT
ELECT REM PT RETURN 9FT ADLT (ELECTROSURGICAL) ×2
ELECTRODE REM PT RTRN 9FT ADLT (ELECTROSURGICAL) ×1 IMPLANT
GAUZE 4X4 16PLY RFD (DISPOSABLE) ×2 IMPLANT
GLOVE BIOGEL PI IND STRL 7.0 (GLOVE) ×4 IMPLANT
GLOVE BIOGEL PI IND STRL 8 (GLOVE) ×1 IMPLANT
GLOVE BIOGEL PI INDICATOR 7.0 (GLOVE) ×4
GLOVE BIOGEL PI INDICATOR 8 (GLOVE) ×1
GLOVE ECLIPSE 8.0 STRL XLNG CF (GLOVE) ×2 IMPLANT
GOWN STRL REUS W/TWL LRG LVL3 (GOWN DISPOSABLE) ×2 IMPLANT
GOWN STRL REUS W/TWL XL LVL3 (GOWN DISPOSABLE) ×2 IMPLANT
INST SET LAPROSCOPIC GYN AP (KITS) ×2 IMPLANT
KIT TURNOVER CYSTO (KITS) ×2 IMPLANT
NEEDLE HYPO 18GX1.5 BLUNT FILL (NEEDLE) ×2 IMPLANT
NEEDLE HYPO 21X1.5 SAFETY (NEEDLE) ×2 IMPLANT
NEEDLE INSUFFLATION 14GA 120MM (NEEDLE) ×2 IMPLANT
PACK PERI GYN (CUSTOM PROCEDURE TRAY) ×2 IMPLANT
PAD ARMBOARD 7.5X6 YLW CONV (MISCELLANEOUS) ×2 IMPLANT
SET BASIN LINEN APH (SET/KITS/TRAYS/PACK) ×2 IMPLANT
SET TUBE SMOKE EVAC HIGH FLOW (TUBING) ×2 IMPLANT
SHEARS HARMONIC ACE PLUS 36CM (ENDOMECHANICALS) ×2 IMPLANT
SLEEVE ENDOPATH XCEL 5M (ENDOMECHANICALS) ×2 IMPLANT
SOL ANTI FOG 6CC (MISCELLANEOUS) ×1 IMPLANT
SOLUTION ANTI FOG 6CC (MISCELLANEOUS) ×1
SUT VICRYL 0 UR6 27IN ABS (SUTURE) ×2 IMPLANT
SUT VICRYL AB 3-0 FS1 BRD 27IN (SUTURE) ×4 IMPLANT
SYR 10ML LL (SYRINGE) ×2 IMPLANT
SYR 20ML LL LF (SYRINGE) ×4 IMPLANT
TROCAR ENDO BLADELESS 11MM (ENDOMECHANICALS) ×2 IMPLANT
TROCAR XCEL NON-BLD 5MMX100MML (ENDOMECHANICALS) ×2 IMPLANT
WARMER LAPAROSCOPE (MISCELLANEOUS) ×2 IMPLANT

## 2020-02-21 NOTE — Anesthesia Preprocedure Evaluation (Signed)
Anesthesia Evaluation  Patient identified by MRN, date of birth, ID band Patient awake    Reviewed: Allergy & Precautions, H&P , NPO status , Patient's Chart, lab work & pertinent test results, reviewed documented beta blocker date and time   Airway Mallampati: II  TM Distance: >3 FB Neck ROM: full    Dental no notable dental hx. (+) Teeth Intact   Pulmonary neg pulmonary ROS, Current Smoker,    Pulmonary exam normal breath sounds clear to auscultation       Cardiovascular Exercise Tolerance: Good negative cardio ROS   Rhythm:regular Rate:Normal     Neuro/Psych PSYCHIATRIC DISORDERS Anxiety Depression  Neuromuscular disease    GI/Hepatic negative GI ROS, Neg liver ROS,   Endo/Other  negative endocrine ROS  Renal/GU negative Renal ROS  negative genitourinary   Musculoskeletal   Abdominal   Peds  Hematology negative hematology ROS (+)   Anesthesia Other Findings   Reproductive/Obstetrics negative OB ROS                             Anesthesia Physical Anesthesia Plan  ASA: II  Anesthesia Plan: General   Post-op Pain Management:    Induction:   PONV Risk Score and Plan: Ondansetron  Airway Management Planned:   Additional Equipment:   Intra-op Plan:   Post-operative Plan:   Informed Consent: I have reviewed the patients History and Physical, chart, labs and discussed the procedure including the risks, benefits and alternatives for the proposed anesthesia with the patient or authorized representative who has indicated his/her understanding and acceptance.     Dental Advisory Given  Plan Discussed with: CRNA  Anesthesia Plan Comments:         Anesthesia Quick Evaluation

## 2020-02-21 NOTE — Transfer of Care (Signed)
Immediate Anesthesia Transfer of Care Note  Patient: Tiffany [name]. Russ  Procedure(s) Performed: LAPAROSCOPIC BILATERAL TUBAL LIGATION, BILATERAL SALPINGECTOMY TECHNIQUE (Bilateral )  Patient Location: PACU  Anesthesia Type:General  Level of Consciousness: awake, alert  and oriented  Airway & Oxygen Therapy: Patient Spontanous Breathing and Patient connected to face mask oxygen  Post-op Assessment: Report given to RN, Post -op Vital signs reviewed and stable and Patient moving all extremities X 4  Post vital signs: Reviewed and stable  Last Vitals:  Vitals Value Taken Time  BP 115/64 02/21/20 1527  Temp    Pulse 84 02/21/20 1529  Resp 23 02/21/20 1529  SpO2 100 % 02/21/20 1529  Vitals shown include unvalidated device data.  Last Pain:  Vitals:   02/21/20 1238  TempSrc: Oral  PainSc: 0-No pain      Patients Stated Pain Goal: 5 (41/93/79 0240)  Complications: No complications documented.

## 2020-02-21 NOTE — Op Note (Signed)
Preoperative Diagnosis:  1.  Multiparous female desires permanent sterilization                                          2.  Elects to have bilateral salpingectomy for ovarian cancer                                                         prophylaxis  Postoperative Diagnosis:  Same as above  Procedure:  Laparoscopic Bilateral Salpingectomy for the purpose of permanent sterilization  Surgeon:  Jacelyn Grip MD  Anaesthesia: general  Findings:  Patient had normal pelvic anatomy and no intraperitoneal abnormalities.  Description of Operation:  Patient was taken to the OR and placed into supine position where she underwent general anaesthesia.   She was placed in the dorsal lithotomy position and prepped and draped in the usual sterile fashion.   An incision was made in the umbilicus and dissection taken down to the rectus fascia. A Veres needle was used to insufflate the periotneal cavity. An 11 mm non bladed video laparoscope trocar was then placed under direct visualization without difficulty.   The above noted findings were observed.   Two additional 5 mm non bladed trocars were placed in the right and left lower quadrants under direct visualization without difficulty.   The Harmonic scalpel was employed and a salpingectomy of both the right and left tubes was performed.   The tubes were removed from the peritoneal cavity and sent to pathology.   There was good hemostasis bilaterally.   The fascia, peritoneum and subcutaneous tissue were closed using 0 vicryl.   All 3 skin incisions were closed using 3-0 vicryl in a subcuticular fashion.  Exparel 266 mg 20 cc was injected in the 3 incisional/trocar sites.  The patient was awakened from anaesthesia and taken to the PACU with all counts being correct x 3.   The patient received  2 gram of ancef andToradol 30 mg IV preoperatively.  Florian Buff 02/21/2020 3:22 PM

## 2020-02-21 NOTE — H&P (Signed)
Preoperative History and Physical  Tiffany [name] is a 30 y.o. [id number] with No LMP recorded. Patient has had an injection. admitted for a laparoscopic tubal ligation..  We discussed various techniques, including electrocautery, Fallope rings, Filshie clips and bilateral salpingectomy.  Patient opts for bilateral salpingectomy due to almost zero failure rate and the 25-40% lifetime reduction in ovarian cancer.   PMH:    Past Medical History:  Diagnosis Date  . Anxiety   . Chronic back pain   . Depression     PSH:     Past Surgical History:  Procedure Laterality Date  . EXTERNAL EAR SURGERY Left   . LEG SURGERY Left     POb/GynH:      OB History    Gravida  2   Para  2   Term  2   Preterm      AB      Living  2     SAB      IAB      Ectopic      Multiple  0   Live Births  2           SH:   Social History   Tobacco Use  . Smoking status: Current Every Day Smoker    Years: 2.00    Types: Cigarettes  . Smokeless tobacco: Never Used  Vaping Use  . Vaping Use: Never used  Substance Use Topics  . Alcohol use: Yes    Comment: occ  . Drug use: Not Currently    Types: Marijuana    Comment: used in the past    FH:    Family History  Problem Relation Age of Onset  . Depression Mother   . Asthma Sister   . Asthma Sister   . Depression Maternal Grandmother   . Hypertension Maternal Grandfather   . Other Maternal Grandfather        blood clots     Allergies:  Allergies  Allergen Reactions  . Other Itching and Swelling    Coconut    Medications:       Current Facility-Administered Medications:  .  bupivacaine liposome (EXPAREL) 1.3 % injection 266 mg, 20 mL, Infiltration, Once, Oni Dietzman, Mertie Clause, MD .  bupivacaine liposome (EXPAREL) 1.3 % injection 266 mg, 20 mL, Infiltration, Once, Florian Buff, MD .  Derrill Memo ON 02/22/2020] ceFAZolin (ANCEF) IVPB 2g/100 mL premix, 2 g, Intravenous, On Call to OR, Florian Buff, MD .  ketorolac (TORADOL) 30  MG/ML injection 30 mg, 30 mg, Intravenous, Once, Florian Buff, MD .  lactated ringers infusion, , Intravenous, Continuous, Kiel, Coralie Keens, MD .  lactated ringers infusion, , Intravenous, Continuous, Florian Buff, MD  Review of Systems:   Review of Systems  Constitutional: Negative for fever, chills, weight loss, malaise/fatigue and diaphoresis.  HENT: Negative for hearing loss, ear pain, nosebleeds, congestion, sore throat, neck pain, tinnitus and ear discharge.   Eyes: Negative for blurred vision, double vision, photophobia, pain, discharge and redness.  Respiratory: Negative for cough, hemoptysis, sputum production, shortness of breath, wheezing and stridor.   Cardiovascular: Negative for chest pain, palpitations, orthopnea, claudication, leg swelling and PND.  Gastrointestinal: Positive for abdominal pain. Negative for heartburn, nausea, vomiting, diarrhea, constipation, blood in stool and melena.  Genitourinary: Negative for dysuria, urgency, frequency, hematuria and flank pain.  Musculoskeletal: Negative for myalgias, back pain, joint pain and falls.  Skin: Negative for itching and rash.  Neurological: Negative for dizziness, tingling, tremors,  sensory change, speech change, focal weakness, seizures, loss of consciousness, weakness and headaches.  Endo/Heme/Allergies: Negative for environmental allergies and polydipsia. Does not bruise/bleed easily.  Psychiatric/Behavioral: Negative for depression, suicidal ideas, hallucinations, memory loss and substance abuse. The patient is not nervous/anxious and does not have insomnia.      PHYSICAL EXAM:  Blood pressure 128/78, pulse 100, temperature 98.6 F (37 C), temperature source Oral, resp. rate 18.    Vitals reviewed. Constitutional: She is oriented to person, place, and time. She appears well-developed and well-nourished.  HENT:  Head: Normocephalic and atraumatic.  Right Ear: External ear normal.  Left Ear: External ear  normal.  Nose: Nose normal.  Mouth/Throat: Oropharynx is clear and moist.  Eyes: Conjunctivae and EOM are normal. Pupils are equal, round, and reactive to light. Right eye exhibits no discharge. Left eye exhibits no discharge. No scleral icterus.  Neck: Normal range of motion. Neck supple. No tracheal deviation present. No thyromegaly present.  Cardiovascular: Normal rate, regular rhythm, normal heart sounds and intact distal pulses.  Exam reveals no gallop and no friction rub.   No murmur heard. Respiratory: Effort normal and breath sounds normal. No respiratory distress. She has no wheezes. She has no rales. She exhibits no tenderness.  GI: Soft. Bowel sounds are normal. She exhibits no distension and no mass. There is tenderness. There is no rebound and no guarding.  Genitourinary:       Vulva is normal without lesions Vagina is pink moist without discharge Cervix normal in appearance and pap is normal Uterus is normal size, contour, position, consistency, mobility, non-tender Adnexa is negative with normal sized ovaries by sonogram  Musculoskeletal: Normal range of motion. She exhibits no edema and no tenderness.  Neurological: She is alert and oriented to person, place, and time. She has normal reflexes. She displays normal reflexes. No cranial nerve deficit. She exhibits normal muscle tone. Coordination normal.  Skin: Skin is warm and dry. No rash noted. No erythema. No pallor.  Psychiatric: She has a normal mood and affect. Her behavior is normal. Judgment and thought content normal.    Labs: Results for orders placed or performed during the hospital encounter of 02/21/20 (from the past 336 hour(s))  Pregnancy, urine   Collection Time: 02/21/20 12:07 PM  Result Value Ref Range   Preg Test, Ur NEGATIVE (A) NEGATIVE   Recent Results (from the past 2160 hour(s))  CBC     Status: Abnormal   Collection Time: 02/05/20  1:46 PM  Result Value Ref Range   WBC 4.2 4.0 - 10.5 K/uL   RBC  5.35 (H) 3.87 - 5.11 MIL/uL   Hemoglobin 14.4 12.0 - 15.0 g/dL   HCT 42.4 36.0 - 46.0 %   MCV 79.3 (L) 80.0 - 100.0 fL   MCH 26.9 26.0 - 34.0 pg   MCHC 34.0 30.0 - 36.0 g/dL   RDW 13.2 11.5 - 15.5 %   Platelets 337 150 - 400 K/uL   nRBC 0.0 0.0 - 0.2 %    Comment: Performed at [facility], [address], [place], [place] [address]  Comprehensive metabolic panel     Status: Abnormal   Collection Time: 02/05/20  1:46 PM  Result Value Ref Range   Sodium 141 135 - 145 mmol/L   Potassium 4.0 3.5 - 5.1 mmol/L   Chloride 106 98 - 111 mmol/L   CO2 27 22 - 32 mmol/L   Glucose, Bld 111 (H) 70 - 99 mg/dL    Comment:  Glucose reference range applies only to samples taken after fasting for at least 8 hours.   BUN 14 6 - 20 mg/dL   Creatinine, Ser 0.77 0.44 - 1.00 mg/dL   Calcium 9.3 8.9 - 10.3 mg/dL   Total Protein 7.3 6.5 - 8.1 g/dL   Albumin 4.0 3.5 - 5.0 g/dL   AST 13 (L) 15 - 41 U/L   ALT 17 0 - 44 U/L   Alkaline Phosphatase 48 38 - 126 U/L   Total Bilirubin 0.3 0.3 - 1.2 mg/dL   GFR, Estimated >60 >60 mL/min    Comment: (NOTE) Calculated using the CKD-EPI Creatinine Equation (2021)    Anion gap 8 5 - 15    Comment: Performed at [facility], [address], [place], [place] [address]  hCG, quantitative, pregnancy     Status: None   Collection Time: 02/05/20  1:46 PM  Result Value Ref Range   hCG, Beta Chain, Quant, S <1 <5 mIU/mL    Comment:          GEST. AGE      CONC.  (mIU/mL)   <=1 WEEK        5 - 50     2 WEEKS       50 - 500     3 WEEKS       100 - 10,000     4 WEEKS     1,000 - 30,000     5 WEEKS     3,500 - 115,000   6-8 WEEKS     12,000 - 270,000    12 WEEKS     15,000 - 220,000        FEMALE AND NON-PREGNANT FEMALE:     LESS THAN 5 mIU/mL Performed at [facility], [address], [place], [place] [address]   Rapid HIV screen (HIV 1/2 Ab+Ag)     Status: None   Collection Time: 02/05/20  1:46 PM  Result Value Ref Range   HIV-1 P24 Antigen - HIV24 NON REACTIVE NON  REACTIVE    Comment: (NOTE) Detection of p24 may be inhibited by biotin in the sample, causing false negative results in acute infection.    HIV 1/2 Antibodies NON REACTIVE NON REACTIVE   Interpretation (HIV Ag Ab)      A non reactive test result means that HIV 1 or HIV 2 antibodies and HIV 1 p24 antigen were not detected in the specimen.    Comment: Performed at [facility], [address], [place], [place] [address]  Urinalysis, Routine w reflex microscopic Urine, Clean Catch     Status: None   Collection Time: 02/05/20  2:00 PM  Result Value Ref Range   Color, Urine YELLOW YELLOW   APPearance CLEAR CLEAR   Specific Gravity, Urine 1.023 1.005 - 1.030   pH 7.0 5.0 - 8.0   Glucose, UA NEGATIVE NEGATIVE mg/dL   Hgb urine dipstick NEGATIVE NEGATIVE   Bilirubin Urine NEGATIVE NEGATIVE   Ketones, ur NEGATIVE NEGATIVE mg/dL   Protein, ur NEGATIVE NEGATIVE mg/dL   Nitrite NEGATIVE NEGATIVE   Leukocytes,Ua NEGATIVE NEGATIVE    Comment: Performed at [facility], [address], [place], [place] [address]  SARS CORONAVIRUS 2 (TAT 6-24 HRS) Nasopharyngeal Nasopharyngeal Swab     Status: Abnormal   Collection Time: 02/05/20  2:06 PM   Specimen: Nasopharyngeal Swab  Result Value Ref Range   SARS Coronavirus 2 POSITIVE (A) NEGATIVE    Comment: (NOTE) SARS-CoV-2 target nucleic  acids are DETECTED.  The SARS-CoV-2 RNA is generally detectable in upper and lower respiratory specimens during the acute phase of infection. Positive results are indicative of the presence of SARS-CoV-2 RNA. Clinical correlation with patient history and other diagnostic information is  necessary to determine patient infection status. Positive results do not rule out bacterial infection or co-infection with other viruses.  The expected result is Negative.  Fact Sheet for Patients: SugarRoll.be  Fact Sheet for Healthcare Providers: https://www.woods-mathews.com/  This test  is not yet approved or cleared by the [place] FDA and  has been authorized for detection and/or diagnosis of SARS-CoV-2 by FDA under an Emergency Use Authorization (EUA). This EUA will remain  in effect (meaning this test can be used) for the duration of the COVID-19 declaration under Section 564(b)(1) of the Act, 21 U. S.C. section 360bbb-3(b)(1), unless the authorization is terminated or revoked sooner.   Performed at [facility], [place] [address], [place], [place] [address]   Pregnancy, urine     Status: Abnormal   Collection Time: 02/21/20 12:07 PM  Result Value Ref Range   Preg Test, Ur NEGATIVE (A) NEGATIVE    Comment:        THE SENSITIVITY OF THIS METHODOLOGY IS >20 mIU/mL. Performed at [facility], [address] [place], [place] [address]    EKG: No orders found for this or any previous visit.  Imaging Studies: No results found.    Assessment: Multiparous female desires permanent sterilization, opts for bilateral salpingectomy as specific technique  Plan: Laparoscopic sterilization request, patient opts for bilateral salpingectomy  Florian Buff 02/21/2020 1:58 PM

## 2020-02-21 NOTE — Anesthesia Procedure Notes (Signed)
Procedure Name: Intubation Date/Time: 02/21/2020 2:22 PM Performed by: Orlie Dakin, CRNA Pre-anesthesia Checklist: Patient identified, Emergency Drugs available, Suction available and Patient being monitored Patient Re-evaluated:Patient Re-evaluated prior to induction Oxygen Delivery Method: Circle system utilized Preoxygenation: Pre-oxygenation with 100% oxygen Induction Type: IV induction Ventilation: Mask ventilation without difficulty Laryngoscope Size: Mac and 4 Grade View: Grade I Tube type: Oral Tube size: 7.0 mm Number of attempts: 1 Airway Equipment and Method: Stylet Placement Confirmation: ETT inserted through vocal cords under direct vision,  positive ETCO2 and breath sounds checked- equal and bilateral Secured at: 22 cm Tube secured with: Tape Dental Injury: Teeth and Oropharynx as per pre-operative assessment  Comments: DL, coughing with ETT placement.  4x4s bite block used.

## 2020-02-21 NOTE — Discharge Instructions (Signed)
Laparoscopic Tubal Ligation, Care After The following information offers guidance on how to care for yourself after your procedure. Your health care provider may also give you more specific instructions. If you have problems or questions, contact your health care provider. What can I expect after the procedure? After the procedure, it is common to have:  A sore throat if general anesthesia was used.  Pain in shoulders, back, and abdomen. This is caused by the gas that was used during the procedure.  Mild discomfort or cramping in your abdomen.  Pain or soreness in the area where the surgical incision was made.  A bloated feeling.  Tiredness.  Nausea and vomiting. Follow these instructions at home: Medicines  Take over-the-counter and prescription medicines only as told by your health care provider.  Ask your health care provider if the medicine prescribed to you: ? Requires you to avoid driving or using heavy machinery. ? Can cause constipation. You may need to take these actions to prevent or treat constipation:  Drink enough fluid to keep your urine pale yellow.  Take over-the-counter or prescription medicines.  Eat foods that are high in fiber, such as beans, whole grains, and fresh fruits and vegetables.  Limit foods that are high in fat and processed sugars, such as fried or sweet foods.  Do not take aspirin because it can cause bleeding. Incision care  Follow instructions from your health care provider about how to take care of your incision. Make sure you: ? Wash your hands with soap and water for at least 20 seconds before and after you change your bandage (dressing). If soap and water are not available, use hand sanitizer. ? Change your dressing as told by your health care provider. ? Leave stitches (sutures), skin glue, or adhesive strips in place. These skin closures may need to stay in place for 2 weeks or longer. If adhesive strip edges start to loosen and curl  up, you may trim the loose edges. Do not remove adhesive strips completely unless your health care provider tells you to do that.  Check your incision area every day for signs of infection. Check for: ? Redness, swelling, or more pain. ? Fluid or blood. ? Warmth. ? Pus or a bad smell.      Activity  Rest as told by your health care provider.  Avoid sitting for a long time without moving. Get up to take short walks every 1-2 hours. This is important to improve blood flow and breathing. Ask for help if you feel weak or unsteady.  Do not have sex, douche, or put a tampon or anything else in your vagina for 6 weeks or as long as told by your health care provider.  Do not lift anything that is heavier than your baby for 2 weeks, or the limit that you are told, until your health care provider says that it is safe.  Do not take baths, swim, or use a hot tub until your health care provider approves. Ask your health care provider if you may take showers. You may only be allowed to take sponge baths.  Return to your normal activities as told by your health care provider. Ask your health care provider what activities are safe for you. General instructions  After the procedure you may need to wear a sanitary pad for vaginal discharge.  Have someone help you with your daily household tasks for the first few days.  Keep all follow-up visits. This is important. Contact a health  care provider if:  You have redness, swelling, or more pain around your incision.  Your incision feels warm to the touch.  You have pus or a bad smell coming from your incision.  The edges of your incision break open after the sutures have been removed.  Your pain does not improve after 2-3 days.  You have a rash.  You repeatedly become dizzy or light-headed.  Your pain medicine is not helping. Get help right away if:  You have a fever or chills.  You faint.  You have increasing pain in your  abdomen.  You have severe pain in one or both of your shoulders.  You have fluid or blood coming from your sutures or heavy bleeding from your vagina.  You have shortness of breath or difficulty breathing.  You have chest pain, leg pain, or leg swelling.  You have ongoing nausea, vomiting, or diarrhea. These symptoms may represent a serious problem that is an emergency. Do not wait to see if the symptoms will go away. Get medical help right away. Call your local emergency services (911 in the [place] Do not drive yourself to the hospital. Summary  After the procedure, it is common to have mild discomfort or cramping in your abdomen.  After the procedure you may need to wear a sanitary pad for vaginal discharge.  Take over-the-counter and prescription medicines only as told by your health care provider.  Watch for symptoms that should prompt you to call your health care provider.  Keep all follow-up visits. This is important. This information is not intended to replace advice given to you by your health care provider. Make sure you discuss any questions you have with your health care provider. Document Revised: 09/29/2019 Document Reviewed: 09/29/2019 Elsevier Patient Education  2021 Elsevier Inc.     General Anesthesia, Adult, Care After This sheet gives you information about how to care for yourself after your procedure. Your health care provider may also give you more specific instructions. If you have problems or questions, contact your health care provider. What can I expect after the procedure? After the procedure, the following side effects are common:  Pain or discomfort at the IV site.  Nausea.  Vomiting.  Sore throat.  Trouble concentrating.  Feeling cold or chills.  Feeling weak or tired.  Sleepiness and fatigue.  Soreness and body aches. These side effects can affect parts of the body that were not involved in surgery. Follow these instructions at  home: For the time period you were told by your health care provider:  Rest.  Do not participate in activities where you could fall or become injured.  Do not drive or use machinery.  Do not drink alcohol.  Do not take sleeping pills or medicines that cause drowsiness.  Do not make important decisions or sign legal documents.  Do not take care of children on your own.   Eating and drinking  Follow any instructions from your health care provider about eating or drinking restrictions.  When you feel hungry, start by eating small amounts of foods that are soft and easy to digest (bland), such as toast. Gradually return to your regular diet.  Drink enough fluid to keep your urine pale yellow.  If you vomit, rehydrate by drinking water, juice, or clear broth. General instructions  If you have sleep apnea, surgery and certain medicines can increase your risk for breathing problems. Follow instructions from your health care provider about wearing your sleep device: ? Anytime  you are sleeping, including during daytime naps. ? While taking prescription pain medicines, sleeping medicines, or medicines that make you drowsy.  Have a responsible adult stay with you for the time you are told. It is important to have someone help care for you until you are awake and alert.  Return to your normal activities as told by your health care provider. Ask your health care provider what activities are safe for you.  Take over-the-counter and prescription medicines only as told by your health care provider.  If you smoke, do not smoke without supervision.  Keep all follow-up visits as told by your health care provider. This is important. Contact a health care provider if:  You have nausea or vomiting that does not get better with medicine.  You cannot eat or drink without vomiting.  You have pain that does not get better with medicine.  You are unable to pass urine.  You develop a skin  rash.  You have a fever.  You have redness around your IV site that gets worse. Get help right away if:  You have difficulty breathing.  You have chest pain.  You have blood in your urine or stool, or you vomit blood. Summary  After the procedure, it is common to have a sore throat or nausea. It is also common to feel tired.  Have a responsible adult stay with you for the time you are told. It is important to have someone help care for you until you are awake and alert.  When you feel hungry, start by eating small amounts of foods that are soft and easy to digest (bland), such as toast. Gradually return to your regular diet.  Drink enough fluid to keep your urine pale yellow.  Return to your normal activities as told by your health care provider. Ask your health care provider what activities are safe for you. This information is not intended to replace advice given to you by your health care provider. Make sure you discuss any questions you have with your health care provider. Document Revised: 09/28/2019 Document Reviewed: 04/27/2019 Elsevier Patient Education  2021 [place] UNTIL Sunday February 25, 2020. DO NOT USE ANY ADDITIONAL NUMBING MEDICATIONS WITHOUT CONSULTING A PHYSICIAN UNTIL AFTER Sunday   Bupivacaine Liposomal Suspension for Injection What is this medicine? BUPIVACAINE LIPOSOMAL (bue PIV a kane LIP oh som al) is an anesthetic. It causes loss of feeling in the skin or other tissues. It is used to prevent and to treat pain from some procedures. This medicine may be used for other purposes; ask your health care provider or pharmacist if you have questions. COMMON BRAND NAME(S): EXPAREL What should I tell my health care provider before I take this medicine? They need to know if you have any of these conditions:  G6PD deficiency  heart disease  kidney disease  liver disease  low blood pressure  lung or breathing  disease, like asthma  an unusual or allergic reaction to bupivacaine, other medicines, foods, dyes, or preservatives  pregnant or trying to get pregnant  breast-feeding How should I use this medicine? This medicine is injected into the affected area. It is given by a health care provider in a hospital or clinic setting. Talk to your health care provider about the use of this medicine in children. While it may be given to children as young as 6 years for selected conditions, precautions do apply. Overdosage: If you think you  have taken too much of this medicine contact a poison control center or emergency room at once. NOTE: This medicine is only for you. Do not share this medicine with others. What if I miss a dose? This does not apply. What may interact with this medicine? This medicine may interact with the following medications:  acetaminophen  certain antibiotics like dapsone, nitrofurantoin, aminosalicylic acid, sulfonamides  certain medicines for seizures like phenobarbital, phenytoin, valproic acid  chloroquine  cyclophosphamide  flutamide  hydroxyurea  ifosfamide  metoclopramide  nitric oxide  nitroglycerin  nitroprusside  nitrous oxide  other local anesthetics like lidocaine, pramoxine, tetracaine  primaquine  quinine  rasburicase  sulfasalazine This list may not describe all possible interactions. Give your health care provider a list of all the medicines, herbs, non-prescription drugs, or dietary supplements you use. Also tell them if you smoke, drink alcohol, or use illegal drugs. Some items may interact with your medicine. What should I watch for while using this medicine? Your condition will be monitored carefully while you are receiving this medicine. Be careful to avoid injury while the area is numb, and you are not aware of pain. What side effects may I notice from receiving this medicine? Side effects that you should report to your doctor or  health care professional as soon as possible:  allergic reactions like skin rash, itching or hives, swelling of the face, lips, or tongue  seizures  signs and symptoms of a dangerous change in heartbeat or heart rhythm like chest pain; dizziness; fast, irregular heartbeat; palpitations; feeling faint or lightheaded; falls; breathing problems  signs and symptoms of methemoglobinemia such as pale, gray, or blue colored skin; headache; fast heartbeat; shortness of breath; feeling faint or lightheaded, falls; tiredness Side effects that usually do not require medical attention (report to your doctor or health care professional if they continue or are bothersome):  anxious  back pain  changes in taste  changes in vision  constipation  dizziness  fever  nausea, vomiting This list may not describe all possible side effects. Call your doctor for medical advice about side effects. You may report side effects to FDA at 1-800-FDA-1088. Where should I keep my medicine? This drug is given in a hospital or clinic and will not be stored at home. NOTE: This sheet is a summary. It may not cover all possible information. If you have questions about this medicine, talk to your doctor, pharmacist, or health care provider.  2021 Elsevier/Gold Standard (2019-04-20 12:24:57)    Acetaminophen; Hydrocodone tablets or capsules What is this medicine? ACETAMINOPHEN; HYDROCODONE (a set a MEE noe fen; hye droe KOE done) is a pain reliever. It is used to treat moderate to severe pain. This medicine may be used for other purposes; ask your health care provider or pharmacist if you have questions. COMMON BRAND NAME(S): Anexsia, Bancap HC, Ceta-Plus, Co-Gesic, Comfortpak, Dolagesic, Du PontDolorex Forte, [address]/Fiscal ServicesDuoCet, [address], Hydrogesic, [place]landsLorcet, Lorcet HD, Lorcet Plus, Lortab, Margesic H, Maxidone, [place]Norco, Polygesic, CentereachStagesic, HighlandsVanacet, Retail buyerVerdrocet, Vicodin, Vicodin ES, Vicodin HP, [place] BasemanXodol, Zydone What should I tell my health  care provider before I take this medicine? They need to know if you have any of these conditions:  brain tumor  drug abuse or addiction  head injury  heart disease  if you often drink alcohol  kidney disease  liver disease  low adrenal gland function  lung disease, asthma, or breathing problems  seizures  stomach or intestine problems  taken an MAOI like Marplan, Nardil, or Parnate in the  last 14 days  an unusual or allergic reaction to acetaminophen, hydrocodone, other medicines, foods, dyes, or preservatives  pregnant or trying to get pregnant  breast-feeding How should I use this medicine? Take this medicine by mouth with a glass of water. Follow the directions on the prescription label. You can take it with or without food. If it upsets your stomach, take it with food. Do not take your medicine more often than directed. A special MedGuide will be given to you by the pharmacist with each prescription and refill. Be sure to read this information carefully each time. Talk to your pediatrician regarding the use of this medicine in children. Special care may be needed. Overdosage: If you think you have taken too much of this medicine contact a poison control center or emergency room at once. NOTE: This medicine is only for you. Do not share this medicine with others. What if I miss a dose? If you miss a dose, take it as soon as you can. If it is almost time for your next dose, take only that dose. Do not take double or extra doses. What may interact with this medicine? This medicine may interact with the following medications:  alcohol  antiviral medicines for HIV or AIDS  atropine  antihistamines for allergy, cough and cold  certain antibiotics like erythromycin, clarithromycin  certain medicines for anxiety or sleep  certain medicines for bladder problems like oxybutynin, tolterodine  certain medicines for depression like amitriptyline, fluoxetine,  sertraline  certain medicines for fungal infections like ketoconazole and itraconazole  certain medicines for Parkinson's disease like benztropine, trihexyphenidyl  certain medicines for seizures like carbamazepine, phenobarbital, phenytoin, primidone  certain medicines for stomach problems like dicyclomine, hyoscyamine  certain medicines for travel sickness like scopolamine  general anesthetics like halothane, isoflurane, methoxyflurane, propofol  ipratropium  local anesthetics like lidocaine, pramoxine, tetracaine  MAOIs like Carbex, Eldepryl, Marplan, Nardil, and Parnate  medicines that relax muscles for surgery  other medicines with acetaminophen  other narcotic medicines for pain or cough  phenothiazines like chlorpromazine, mesoridazine, prochlorperazine, thioridazine  rifampin This list may not describe all possible interactions. Give your health care provider a list of all the medicines, herbs, non-prescription drugs, or dietary supplements you use. Also tell them if you smoke, drink alcohol, or use illegal drugs. Some items may interact with your medicine. What should I watch for while using this medicine? Tell your health care provider if your pain does not go away, if it gets worse, or if you have new or a different type of pain. You may develop tolerance to this drug. Tolerance means that you will need a higher dose of the drug for pain relief. Tolerance is normal and is expected if you take this drug for a long time. There are different types of narcotic drugs (opioids) for pain. If you take more than one type at the same time, you may have more side effects. Give your health care provider a list of all drugs you use. He or she will tell you how much drug to take. Do not take more drug than directed. Get emergency help right away if you have problems breathing. Do not suddenly stop taking your drug because you may develop a severe reaction. Your body becomes used to the  drug. This does NOT mean you are addicted. Addiction is a behavior related to getting and using a drug for a nonmedical reason. If you have pain, you have a medical reason to take pain drug.  Your health care provider will tell you how much drug to take. If your health care provider wants you to stop the drug, the dose will be slowly lowered over time to avoid any side effects. Talk to your health care provider about naloxone and how to get it. Naloxone is an emergency drug used for an opioid overdose. An overdose can happen if you take too much opioid. It can also happen if an opioid is taken with some other drugs or substances, like alcohol. Know the symptoms of an overdose, like trouble breathing, unusually tired or sleepy, or not being able to respond or wake up. Make sure to tell caregivers and close contacts where it is stored. Make sure they know how to use it. After naloxone is given, you must get emergency help right away. Naloxone is a temporary treatment. Repeat doses may be needed. Do not take other drugs that contain acetaminophen with this drug. Many non-prescription drugs contain acetaminophen. Always read labels carefully. If you have questions, ask your health care provider. If you take too much acetaminophen, get medical help right away. Too much acetaminophen can be very dangerous and cause liver damage. Even if you do not have symptoms, it is important to get help right away. You may get drowsy or dizzy. Do not drive, use machinery, or do anything that needs mental alertness until you know how this drug affects you. Do not stand up or sit up quickly, especially if you are an older patient. This reduces the risk of dizzy or fainting spells. Alcohol may interfere with the effect of this drug. Avoid alcoholic drinks. This drug will cause constipation. If you do not have a bowel movement for 3 days, call your health care provider. Your mouth may get dry. Chewing sugarless gum or sucking hard  candy and drinking plenty of water may help. Contact your health care provider if the problem does not go away or is severe. What side effects may I notice from receiving this medicine? Side effects that you should report to your doctor or health care professional as soon as possible:  allergic reactions like skin rash, itching or hives, swelling of the face, lips, or tongue  breathing problems  confusion  redness, blistering, peeling or loosening of the skin, including inside the mouth  signs and symptoms of low blood pressure like dizziness; feeling faint or lightheaded, falls; unusually weak or tired  trouble passing urine or change in the amount of urine  yellowing of the eyes or skin Side effects that usually do not require medical attention (report to your doctor or health care professional if they continue or are bothersome):  constipation  dry mouth  nausea, vomiting  tiredness This list may not describe all possible side effects. Call your doctor for medical advice about side effects. You may report side effects to FDA at 1-800-FDA-1088. Where should I keep my medicine? Keep out of the reach of children. This medicine can be abused. Keep your medicine in a safe place to protect it from theft. Do not share this medicine with anyone. Selling or giving away this medicine is dangerous and against the law. Store at room temperature between 15 and 30 degrees C (59 and 86 degrees F). This medicine may cause harm and death if it is taken by other adults, children, or pets. Return medicine that has not been used to an official disposal site. Contact the DEA at (956) 120-3497 or your city/county government to find a site. If you cannot  return the medicine, flush it down the toilet. Do not use the medicine after the expiration date. NOTE: This sheet is a summary. It may not cover all possible information. If you have questions about this medicine, talk to your doctor, pharmacist, or  health care provider.  2021 Elsevier/Gold Standard (2018-08-24 12:25:54)    Ketorolac Oral Tablets What is this medicine? KETOROLAC (kee toe ROLE ak) is a non-steroidal anti-inflammatory drug, also known as an NSAID. It treats pain, inflammation, and swelling. This medicine may be used for other purposes; ask your health care provider or pharmacist if you have questions. COMMON BRAND NAME(S): Toradol What should I tell my health care provider before I take this medicine? They need to know if you have any of these conditions:  bleeding disorder  coronary artery bypass graft (CABG) within the past 2 weeks  heart attack  heart disease  heart failure  high blood pressure  if you often drink alcohol  kidney disease  liver disease  lung or breathing disease (asthma)  receiving steroids like dexamethasone or prednisone  smoke tobacco cigarettes  stomach bleeding  stomach ulcers, other stomach or intestine problems  take medicine to treat or prevent blood clots  an unusual or allergic reaction to ketorolac, other medicines, foods, dyes, or preservatives  pregnant or trying to get pregnant  breast-feeding How should I use this medicine? Take this medicine by mouth. Take it as directed on the prescription label at the same time every day. You can take it with or without food. If it upsets your stomach, take it with food. There may be unused or extra doses in the bottle after you finish the dosing cycle. Talk to your health care provider if you have questions about your dose. A special MedGuide will be given to you by the pharmacist with each prescription and refill. Be sure to read this information carefully each time. Talk to your health care provider about the use of this medicine in children. Special care may be needed. Patients over 91 years of age may have a stronger reaction and need a smaller dose. Overdosage: If you think you have taken too much of this medicine  contact a poison control center or emergency room at once. NOTE: This medicine is only for you. Do not share this medicine with others. What if I miss a dose? If you miss a dose, skip it. Take your next dose at the normal time. Do not take extra or 2 doses at the same time to make up for the missed dose. What may interact with this medicine? Do not take this medicine with any of the following medications:  aspirin and aspirin-like medicines  cidofovir  methotrexate  NSAIDs, medicines for pain and inflammation, like ibuprofen or naproxen  pemetrexed  probenecid This medicine may also interact with the following medications:  alcohol  alendronate  alprazolam  carbamazepine  cyclosporine  diuretics  flavocoxid  fluoxetine  ginkgo  lithium  medicines for high blood pressure like enalapril  medicines that affect platelets like pentoxifylline  medicines that treat or prevent blood clots like heparin, warfarin  muscle relaxants  phenytoin  steroid medicines like prednisone or cortisone  thiothixene This list may not describe all possible interactions. Give your health care provider a list of all the medicines, herbs, non-prescription drugs, or dietary supplements you use. Also tell them if you smoke, drink alcohol, or use illegal drugs. Some items may interact with your medicine. What should I watch for while using  this medicine? Visit your health care provider for regular checks on your progress. Tell your health care provider if your symptoms do not start to get better or if they get worse. Do not use this medicine for more than 5 days. It is only used for short-term treatment of moderate to severe pain. The risk of side effects such as kidney damage and stomach bleeding are higher if used for more than 5 days. Do not take other medicines that contain aspirin, ibuprofen, or naproxen with this medicine. Side effects such as stomach upset, nausea, or ulcers may be  more likely to occur. Many non-prescription medicines contain aspirin, ibuprofen, or naproxen. Always read labels carefully. This medicine can cause serious ulcers and bleeding in the stomach. It can happen with no warning. Smoking, drinking alcohol, older age, and poor health can also increase risks. Call your health care provider right away if you have stomach pain or blood in your vomit or stool. This medicine may cause serious skin reactions. They can happen weeks to months after starting the medicine. Contact your health care provider right away if you notice fevers or flu-like symptoms with a rash. The rash may be red or purple and then turn into blisters or peeling of the skin. Or, you might notice a red rash with swelling of the face, lips or lymph nodes in your neck or under your arms. This medicine does not prevent a heart attack or stroke. This medicine may increase the chance of a heart attack or stroke. The chance may increase the longer you use this medicine or if you have heart disease. If you take aspirin to prevent a heart attack or stroke, talk to your health care provider about using this medicine. You may get drowsy or dizzy. Do not drive, use machinery, or do anything that needs mental alertness until you know how this medicine affects you. Do not stand up or sit up quickly, especially if you are an older patient. This reduces the risk of dizzy or fainting spells. What side effects may I notice from receiving this medicine? Side effects that you should report to your doctor or health care professional as soon as possible:  allergic reactions (skin rash, itching or hives; swelling of the face, lips, or tongue)  bleeding (bloody or black, tarry stools; red or dark brown urine; spitting up blood or brown material that looks like coffee grounds; red spots on the skin; unusual bruising or bleeding from the eyes, gums, or nose)  heart attack (trouble breathing; pain or tightness in the  chest, neck, back or arms; unusually weak or tired)  heart failure (trouble breathing; fast, irregular heartbeat; sudden weight gain; swelling of the ankles, feet, hands; unusually weak or tired)  kidney injury (trouble passing urine or change in the amount of urine)  liver injury (dark yellow or brown urine; general ill feeling or flu-like symptoms; loss of appetite, right upper belly pain; unusually weak or tired, yellowing of the eyes or skin)  low red blood cell counts (trouble breathing; feeling faint; lightheaded, falls; unusually weak or tired)  rash, fever, and swollen lymph nodes  redness, blistering, peeling, or loosening of the skin, including inside the mouth  stroke (changes in vision; confusion; trouble speaking or understanding; severe headaches; sudden numbness or weakness of the face, arm or leg; trouble walking; dizziness; loss of balance or coordination) Side effects that usually do not require medical attention (report to your doctor or health care professional if they continue or  are bothersome):  constipation  decreased hearing, ringing in the ears  diarrhea  headache  nausea  passing gas  stomach pain  upset stomach This list may not describe all possible side effects. Call your doctor for medical advice about side effects. You may report side effects to FDA at 1-800-FDA-1088. Where should I keep my medicine? Keep out of the reach of children and pets. Store at room temperature between 15 and 30 degrees C (59 and 86 degrees F). Protect from moisture. Keep the container tightly closed. Protect from light. Get rid of any unused medicine after the expiration date. To get rid of medicines that are no longer needed or expired:  Take the medicine to a medicine take-back program. Check with your pharmacy or law enforcement to find a location.  If you cannot return the medicine, check the label or package insert to see if the medicine should be thrown out in the  garbage or flushed down the toilet. If you are not sure, ask your health care provider. If it is safe to put in the trash, pour the medicine out of the container. Mix the medicine with cat litter, dirt, coffee grounds, or other unwanted substance. Seal the mixture in a bag or container. Put it in the trash. NOTE: This sheet is a summary. It may not cover all possible information. If you have questions about this medicine, talk to your doctor, pharmacist, or health care provider.  2021 Elsevier/Gold Standard (2019-06-02 10:29:52)    Ondansetron oral dissolving tablet What is this medicine? ONDANSETRON (on DAN se tron) is used to treat nausea and vomiting caused by chemotherapy. It is also used to prevent or treat nausea and vomiting after surgery. This medicine may be used for other purposes; ask your health care provider or pharmacist if you have questions. COMMON BRAND NAME(S): Zofran ODT What should I tell my health care provider before I take this medicine? They need to know if you have any of these conditions:  heart disease  history of irregular heartbeat  liver disease  low levels of magnesium or potassium in the blood  an unusual or allergic reaction to ondansetron, granisetron, other medicines, foods, dyes, or preservatives  pregnant or trying to get pregnant  breast-feeding How should I use this medicine? These tablets are made to dissolve in the mouth. Do not try to push the tablet through the foil backing. With dry hands, peel away the foil backing and gently remove the tablet. Place the tablet in the mouth and allow it to dissolve, then swallow. While you may take these tablets with water, it is not necessary to do so. Talk to your pediatrician regarding the use of this medicine in children. Special care may be needed. Overdosage: If you think you have taken too much of this medicine contact a poison control center or emergency room at once. NOTE: This medicine is only for  you. Do not share this medicine with others. What if I miss a dose? If you miss a dose, take it as soon as you can. If it is almost time for your next dose, take only that dose. Do not take double or extra doses. What may interact with this medicine? Do not take this medicine with any of the following medications:  apomorphine  certain medicines for fungal infections like fluconazole, itraconazole, ketoconazole, posaconazole, voriconazole  cisapride  dronedarone  pimozide  thioridazine This medicine may also interact with the following medications:  carbamazepine  certain medicines for depression,  anxiety, or psychotic disturbances  fentanyl  linezolid  MAOIs like Carbex, Eldepryl, Marplan, Nardil, and Parnate  methylene blue (injected into a vein)  other medicines that prolong the QT interval (cause an abnormal heart rhythm) like dofetilide, ziprasidone  phenytoin  rifampicin  tramadol This list may not describe all possible interactions. Give your health care provider a list of all the medicines, herbs, non-prescription drugs, or dietary supplements you use. Also tell them if you smoke, drink alcohol, or use illegal drugs. Some items may interact with your medicine. What should I watch for while using this medicine? Check with your doctor or health care professional as soon as you can if you have any sign of an allergic reaction. What side effects may I notice from receiving this medicine? Side effects that you should report to your doctor or health care professional as soon as possible:  allergic reactions like skin rash, itching or hives, swelling of the face, lips, or tongue  breathing problems  confusion  dizziness  fast or irregular heartbeat  feeling faint or lightheaded, falls  fever and chills  loss of balance or coordination  seizures  sweating  swelling of the hands and feet  tightness in the chest  tremors  unusually weak or  tired Side effects that usually do not require medical attention (report to your doctor or health care professional if they continue or are bothersome):  constipation or diarrhea  headache This list may not describe all possible side effects. Call your doctor for medical advice about side effects. You may report side effects to FDA at 1-800-FDA-1088. Where should I keep my medicine? Keep out of the reach of children. Store between 2 and 30 degrees C (36 and 86 degrees F). Throw away any unused medicine after the expiration date. NOTE: This sheet is a summary. It may not cover all possible information. If you have questions about this medicine, talk to your doctor, pharmacist, or health care provider.  2021 Elsevier/Gold Standard (2018-01-04 07:14:10)

## 2020-02-22 ENCOUNTER — Encounter: Payer: Self-pay | Admitting: Family Medicine

## 2020-02-22 ENCOUNTER — Telehealth: Payer: Self-pay | Admitting: Obstetrics & Gynecology

## 2020-02-22 NOTE — Telephone Encounter (Signed)
Called pt to let her know that Dr. Elonda Husky released her to return to work on Monday 1/31. She stated that she will call her boss to see if there is something light duty she can do so she is not lifting more than 20lbs or twisting. Pt will f/u on 2/7 at post-op visit.

## 2020-02-22 NOTE — Telephone Encounter (Signed)
Pt called & post op was scheduled for 03/04/2020 (Dr. Elonda Husky had no availability on 03/01/2020) - pt wants to know when she can return to work, please advise

## 2020-02-22 NOTE — Anesthesia Postprocedure Evaluation (Signed)
Anesthesia Post Note  Patient: Tiffany [name]. [place]  Procedure(s) Performed: LAPAROSCOPIC BILATERAL TUBAL LIGATION, BILATERAL SALPINGECTOMY TECHNIQUE (Bilateral )  Patient location during evaluation: Phase II Anesthesia Type: General Level of consciousness: awake Pain management: pain level controlled Vital Signs Assessment: post-procedure vital signs reviewed and stable Respiratory status: spontaneous breathing Cardiovascular status: blood pressure returned to baseline Postop Assessment: no headache Anesthetic complications: no   No complications documented.   Last Vitals:  Vitals:   02/21/20 1610 02/21/20 1615  BP: (!) 106/50   Pulse: 60 60  Resp: 18 16  Temp: 36.8 C   SpO2: 100% 100%    Last Pain:  Vitals:   02/21/20 1610  TempSrc: Oral  PainSc: [place]

## 2020-02-22 NOTE — Telephone Encounter (Signed)
She can return to work this Monday 02/26/20

## 2020-02-22 NOTE — Telephone Encounter (Signed)
Patient has some questions and concerns about her Post op appt that she thought was scheduled on 03/01/2020 and doctor's note. Per patient. Clinical staff will follow up with patient.

## 2020-02-22 NOTE — Telephone Encounter (Signed)
Returned pt's call. She wanted to make sure she had a follow-up appt with Dr. Elonda Husky per the paperwork given to her at discharge. She will call the office and get that scheduled. Pt reports doing well.

## 2020-02-23 ENCOUNTER — Encounter (HOSPITAL_COMMUNITY): Payer: Self-pay | Admitting: Obstetrics & Gynecology

## 2020-02-23 LAB — SURGICAL PATHOLOGY

## 2020-02-28 ENCOUNTER — Telehealth: Payer: Self-pay

## 2020-02-28 NOTE — Telephone Encounter (Signed)
Pt called and stated that she is having trouble having a BM and took a laxative on 0201/22 in the PM had a small BM, but still feels the need to have a BM and stated that she is cramping, pt stated she had a tubal on 02/21/20

## 2020-03-04 ENCOUNTER — Other Ambulatory Visit: Payer: Self-pay

## 2020-03-04 ENCOUNTER — Encounter: Payer: Self-pay | Admitting: Obstetrics & Gynecology

## 2020-03-04 ENCOUNTER — Ambulatory Visit (INDEPENDENT_AMBULATORY_CARE_PROVIDER_SITE_OTHER): Payer: Medicaid Other | Admitting: Obstetrics & Gynecology

## 2020-03-04 VITALS — BP 107/70 | HR 70 | Wt 243.0 lb

## 2020-03-04 DIAGNOSIS — Z9889 Other specified postprocedural states: Secondary | ICD-10-CM

## 2020-03-04 NOTE — Progress Notes (Signed)
   HPI: Patient returns for routine postoperative follow-up having undergone Laparoscopic bilateral salpingectomy on 02/21/20.  The patient's immediate postoperative recovery has been unremarkable. Since hospital discharge the patient reports no problems.   Current Outpatient Medications: PARoxetine (PAXIL) 20 MG tablet, Take 1 tablet (20 mg total) by mouth daily. (Patient taking differently: Take 20 mg by mouth at bedtime.), Disp: 30 tablet, Rfl: 2 traZODone (DESYREL) 50 MG tablet, 25-50 mg at night for insomnia (Patient taking differently: Take 50 mg by mouth at bedtime as needed for sleep. 25-50 mg at night for insomnia), Disp: 90 tablet, Rfl: 0 HYDROcodone-acetaminophen (NORCO/VICODIN) 5-325 MG tablet, Take 1 tablet by mouth every 6 (six) hours as needed. (Patient not taking: Reported on 03/04/2020), Disp: 15 tablet, Rfl: 0 ketorolac (TORADOL) 10 MG tablet, Take 1 tablet (10 mg total) by mouth every 8 (eight) hours as needed. (Patient not taking: Reported on 03/04/2020), Disp: 15 tablet, Rfl: 0 ondansetron (ZOFRAN ODT) 8 MG disintegrating tablet, Take 1 tablet (8 mg total) by mouth every 8 (eight) hours as needed for nausea or vomiting. (Patient not taking: Reported on 03/04/2020), Disp: 8 tablet, Rfl: 0  No current facility-administered medications for this visit.    Blood pressure 107/70, pulse 70, weight 243 lb (110.2 kg).  Physical Exam: 3 normal incisions  Diagnostic Tests:   Pathology: Benign tubes  Impression:   ICD-10-CM   1. S/P bilateral salpingectomy 02/21/20  Z98.890       Plan: No orders of the defined types were placed in this encounter.    Follow up: Prn    Florian Buff, MD

## 2020-03-12 ENCOUNTER — Ambulatory Visit (INDEPENDENT_AMBULATORY_CARE_PROVIDER_SITE_OTHER): Payer: Medicaid Other

## 2020-03-12 ENCOUNTER — Encounter: Payer: Self-pay | Admitting: Family Medicine

## 2020-03-12 ENCOUNTER — Other Ambulatory Visit: Payer: Self-pay

## 2020-03-12 ENCOUNTER — Ambulatory Visit: Payer: Medicaid Other | Admitting: Family Medicine

## 2020-03-12 VITALS — BP 115/75 | HR 90 | Ht 67.0 in | Wt 242.0 lb

## 2020-03-12 DIAGNOSIS — M7989 Other specified soft tissue disorders: Secondary | ICD-10-CM

## 2020-03-12 DIAGNOSIS — G8929 Other chronic pain: Secondary | ICD-10-CM

## 2020-03-12 DIAGNOSIS — M79672 Pain in left foot: Secondary | ICD-10-CM

## 2020-03-12 MED ORDER — PREDNISONE 10 MG (21) PO TBPK: ORAL_TABLET | ORAL | 0 refills | Status: DC

## 2020-03-12 NOTE — Patient Instructions (Signed)

## 2020-03-12 NOTE — Progress Notes (Signed)
Acute Office Visit  Subjective:    Patient ID: Tiffany [name]. Tiffany [name], female    DOB: 1990-02-18, 30 y.o.   MRN: [id number]  Chief Complaint  Patient presents with  . Foot Swelling    left    HPI Patient is in today for pain and swelling in her left foot. She reports that this has been going on for awhile. She did recently have surgery on 03/04/20 for a bilateral salpingectomy. She notified worsening pain and swelling since then. She has also had some itching on this foot at time. She denies known injury. Denies fever or warmth. The pain is on the top of her foot. The pain is achy at time and throbbing at other times. The pain is 8-9/10. The pain is worse with standing and throughout the day. It does not hurt to walk. She has not tried anything for the pain other than ice and heat, neither of which helped. She has pins in her ankle from a fractures when she was in 5th grade, there is also a plate in her lower leg from this procedure. She denies decreased ROM. She denies chest pain or shortness of breath.   Past Medical History:  Diagnosis Date  . Anxiety   . Chronic back pain   . Depression     Past Surgical History:  Procedure Laterality Date  . EXTERNAL EAR SURGERY Left   . LAPAROSCOPIC BILATERAL SALPINGECTOMY Bilateral 02/21/2020  . LAPAROSCOPIC BILATERAL SALPINGECTOMY Bilateral 02/21/2020   Procedure: LAPAROSCOPIC BILATERAL TUBAL LIGATION, BILATERAL SALPINGECTOMY TECHNIQUE;  Surgeon: Florian Buff, MD;  Location: [facility];  Service: Gynecology;  Laterality: Bilateral;  . LEG SURGERY Left     Family History  Problem Relation Age of Onset  . Depression Mother   . Asthma Sister   . Asthma Sister   . Depression Maternal Grandmother   . Hypertension Maternal Grandfather   . Other Maternal Grandfather        blood clots    Social History   Socioeconomic History  . Marital status: Single    Spouse name: Not on file  . Number of children: 2  . Years of education: Not on file  .  Highest education level: Not on file  Occupational History  . Not on file  Tobacco Use  . Smoking status: Current Every Day Smoker    Years: 2.00    Types: Cigarettes  . Smokeless tobacco: Never Used  Vaping Use  . Vaping Use: Never used  Substance and Sexual Activity  . Alcohol use: Yes    Comment: occ  . Drug use: Not Currently    Types: Marijuana    Comment: used in the past  . Sexual activity: Yes    Birth control/protection: Injection  Other Topics Concern  . Not on file  Social History Narrative  . Not on file   Social Determinants of Health   Financial Resource Strain: Not on file  Food Insecurity: Not on file  Transportation Needs: Not on file  Physical Activity: Not on file  Stress: Not on file  Social Connections: Not on file  Intimate Partner Violence: Not on file    Outpatient Medications Prior to Visit  Medication Sig Dispense Refill  . PARoxetine (PAXIL) 20 MG tablet Take 1 tablet (20 mg total) by mouth daily. (Patient taking differently: Take 20 mg by mouth at bedtime.) 30 tablet 2  . traZODone (DESYREL) 50 MG tablet 25-50 mg at night for insomnia (Patient taking differently: Take 50  mg by mouth at bedtime as needed for sleep. 25-50 mg at night for insomnia) 90 tablet 0  . HYDROcodone-acetaminophen (NORCO/VICODIN) 5-325 MG tablet Take 1 tablet by mouth every 6 (six) hours as needed. 15 tablet 0  . ketorolac (TORADOL) 10 MG tablet Take 1 tablet (10 mg total) by mouth every 8 (eight) hours as needed. 15 tablet 0  . ondansetron (ZOFRAN ODT) 8 MG disintegrating tablet Take 1 tablet (8 mg total) by mouth every 8 (eight) hours as needed for nausea or vomiting. 8 tablet 0   No facility-administered medications prior to visit.    Allergies  Allergen Reactions  . Other Itching and Swelling    Coconut    Review of Systems As per HPI.     Objective:    Physical Exam Vitals and nursing note reviewed.  Constitutional:      General: She is not in acute  distress.    Appearance: Normal appearance. She is not ill-appearing, toxic-appearing or diaphoretic.  HENT:     Head: Normocephalic and atraumatic.  Pulmonary:     Effort: Pulmonary effort is normal. No respiratory distress.  Musculoskeletal:        General: No tenderness.     Right lower leg: No edema.     Left lower leg: No edema.     Left foot: Normal range of motion and normal capillary refill. Swelling (mild) present. No deformity, tenderness, bony tenderness or crepitus. Normal pulse.  Skin:    General: Skin is warm and dry.  Neurological:     General: No focal deficit present.     Mental Status: She is alert and oriented to person, place, and time.     Ht 5\' 7"  (1.702 m)   Wt 242 lb (109.8 kg)   BMI 37.90 kg/m  Wt Readings from Last 3 Encounters:  03/12/20 242 lb (109.8 kg)  03/04/20 243 lb (110.2 kg)  02/05/20 245 lb (111.1 kg)    Health Maintenance Due  Topic Date Due  . Hepatitis C Screening  Never done  . COVID-19 Vaccine (1) Never done  . PAP-Cervical Cytology Screening  01/14/2020  . PAP SMEAR-Modifier  01/14/2020    There are no preventive care reminders to display for this patient.   Lab Results  Component Value Date   TSH 1.54 11/29/2017   Lab Results  Component Value Date   WBC 4.2 02/05/2020   HGB 14.4 02/05/2020   HCT 42.4 02/05/2020   MCV 79.3 (L) 02/05/2020   PLT 337 02/05/2020   Lab Results  Component Value Date   NA 141 02/05/2020   K 4.0 02/05/2020   CO2 27 02/05/2020   GLUCOSE 111 (H) 02/05/2020   BUN 14 02/05/2020   CREATININE 0.77 02/05/2020   BILITOT 0.3 02/05/2020   ALKPHOS 48 02/05/2020   AST 13 (L) 02/05/2020   ALT 17 02/05/2020   PROT 7.3 02/05/2020   ALBUMIN 4.0 02/05/2020   CALCIUM 9.3 02/05/2020   ANIONGAP 8 02/05/2020   No results found for: CHOL No results found for: HDL No results found for: LDLCALC No results found for: TRIG No results found for: CHOLHDL No results found for: HGBA1C     Assessment &  Plan:   Tiffany [name] was seen today for foot swelling.  Diagnoses and all orders for this visit:  Chronic foot pain, left/Swelling of left foot Xray today in office, radiology report pending. History of prior surgery in this foot. Prednisone pack given. Advil for pain and swelling.  Elevation, compression, heat, ice a needed. Patient is willing to attempt PT if needed and would like to see an orthopedic in [place] if warranted. Return to office for new or worsening symptoms, or if symptoms persist.  -     DG Foot Complete Left; Future -     predniSONE (STERAPRED UNI-PAK 21 TAB) 10 MG (21) TBPK tablet; Use as directed on back of pill pack  The patient indicates understanding of these issues and agrees with the plan.  Gwenlyn Perking, FNP

## 2020-03-13 ENCOUNTER — Other Ambulatory Visit: Payer: Self-pay | Admitting: Family Medicine

## 2020-03-13 DIAGNOSIS — G8929 Other chronic pain: Secondary | ICD-10-CM

## 2020-03-20 ENCOUNTER — Other Ambulatory Visit: Payer: Self-pay | Admitting: Orthopedic Surgery

## 2020-03-20 ENCOUNTER — Ambulatory Visit (INDEPENDENT_AMBULATORY_CARE_PROVIDER_SITE_OTHER): Payer: Medicaid Other | Admitting: Orthopedic Surgery

## 2020-03-20 ENCOUNTER — Ambulatory Visit: Payer: Medicaid Other

## 2020-03-20 ENCOUNTER — Other Ambulatory Visit: Payer: Self-pay

## 2020-03-20 ENCOUNTER — Encounter: Payer: Self-pay | Admitting: Orthopedic Surgery

## 2020-03-20 VITALS — BP 121/75 | HR 89 | Ht 67.0 in | Wt 240.0 lb

## 2020-03-20 DIAGNOSIS — M2142 Flat foot [pes planus] (acquired), left foot: Secondary | ICD-10-CM | POA: Diagnosis not present

## 2020-03-20 DIAGNOSIS — M79672 Pain in left foot: Secondary | ICD-10-CM

## 2020-03-20 DIAGNOSIS — M2141 Flat foot [pes planus] (acquired), right foot: Secondary | ICD-10-CM | POA: Diagnosis not present

## 2020-03-20 NOTE — Progress Notes (Signed)
New Patient Visit  Assessment: Tiffany [name] is a 30 y.o. female with the following: Bilateral flat feet; left is currently painful   Plan: [name] currently has left foot pain, localized to her midfoot.  We reviewed XR in clinic today, including weight bearing films, which do not demonstrate abnormality.  There is no evidence of avascular necrosis or hypervascularity concerning for early charcot.  On physical exam, there is no obvious source of her pain.  She does have some mild diffuse swelling over her midfoot, without concern for infection including fluid collection or fluctuance.  She is not point tender.  She has excellent strength on dorsiflexion of her ankle and her great toe.  No instability is noted within the midfoot.  At this point, the source of her pain can only be attributed to her flatfoot deformity.  She states that she uses over-the-counter insoles intermittently, depending on the shoes that she is wearing.  I have advised her to continue to use these insoles as much as possible, which she says recreate an arch.  We could potentially consider custom orthotics, but she is not interested at this time.  She also take Tylenol or ibuprofen as needed.  If she notes a worsening in the swelling, redness or her pain, have asked her to return to clinic.  Otherwise, return as needed.   Follow-up: Return if symptoms worsen or fail to improve.  Subjective:  Chief Complaint  Patient presents with  . Foot Pain    Left foot pain, Patient reports she had a surgery to remove her tubes and she said it feels like aching and throbbing,     History of Present Illness: Tiffany [name] is a 30 y.o. female who has been referred to clinic today by Marjorie Smolder, FNP for evaluation of left foot pain.  She has had pain in the foot for a while, but notes acute worsening since undergoing surgery a couple of weeks ago.  No previous injury.  Pain is worse with standing.  She stands for long periods of  time at work.  Pain is localized to her midfoot.  She does have a history of surgery on her ankle, but this was many years ago, and she has no issues with pain in her ankle.  Recently, she has noted increase in the redness and swelling of her foot.  She attempted to elevate the foot, the swelling did not improve, but the discoloration did.  She will take Tylenol or ibuprofen on occasion.  She uses insoles in her shoes, but not consistently.  No physical therapy.   Review of Systems: No fevers or chills No numbness or tingling No chest pain No shortness of breath No bowel or bladder dysfunction No GI distress No headaches   Medical History:  Past Medical History:  Diagnosis Date  . Anxiety   . Chronic back pain   . Depression     Past Surgical History:  Procedure Laterality Date  . EXTERNAL EAR SURGERY Left   . LAPAROSCOPIC BILATERAL SALPINGECTOMY Bilateral 02/21/2020  . LAPAROSCOPIC BILATERAL SALPINGECTOMY Bilateral 02/21/2020   Procedure: LAPAROSCOPIC BILATERAL TUBAL LIGATION, BILATERAL SALPINGECTOMY TECHNIQUE;  Surgeon: Florian Buff, MD;  Location: [facility];  Service: Gynecology;  Laterality: Bilateral;  . LEG SURGERY Left     Family History  Problem Relation Age of Onset  . Depression Mother   . Asthma Sister   . Asthma Sister   . Depression Maternal Grandmother   . Hypertension Maternal Grandfather   .  Other Maternal Grandfather        blood clots   Social History   Tobacco Use  . Smoking status: Current Every Day Smoker    Years: 2.00    Types: Cigarettes  . Smokeless tobacco: Never Used  Vaping Use  . Vaping Use: Never used  Substance Use Topics  . Alcohol use: Yes    Comment: occ  . Drug use: Not Currently    Types: Marijuana    Comment: used in the past    Allergies  Allergen Reactions  . Other Itching and Swelling    Coconut    Current Meds  Medication Sig  . PARoxetine (PAXIL) 20 MG tablet Take 1 tablet (20 mg total) by mouth daily. (Patient  taking differently: Take 20 mg by mouth at bedtime.)  . predniSONE (STERAPRED UNI-PAK 21 TAB) 10 MG (21) TBPK tablet Use as directed on back of pill pack  . traZODone (DESYREL) 50 MG tablet 25-50 mg at night for insomnia (Patient taking differently: Take 50 mg by mouth at bedtime as needed for sleep. 25-50 mg at night for insomnia)    Objective: BP 121/75   Pulse 89   Ht 5\' 7"  (1.702 m)   Wt 240 lb (108.9 kg)   BMI 37.59 kg/m   Physical Exam:  General: Obese female.  Alert and oriented.  No acute distress. Gait: Left-sided antalgic gait  Evaluation of left foot demonstrates mild swelling over the dorsal aspect of her midfoot.  Tenderness is diffuse.  She has 5/5 strength with dorsiflexion of her ankle, as well as her toes.  Sensation is intact throughout the foot.  She does have bilateral flat feet, as noted by the too many toes sign.  There is no midfoot deformity or instability with midfoot stress testing.  Toes are warm and well perfused.  IMAGING: I personally ordered and reviewed the following images   Standing left foot x-rays were obtained in clinic today and demonstrates no acute injury.  There is no acute dislocation.  No degenerative changes are noted.  Hardware within the tibia is partially visualized.  The hardware is not prominent.  There is no sclerosis of the tarsal bones, which is occasionally seen with avascular necrosis.  There is no signs of hypervascularity or breakdown.  Overall flatfoot deformity.  Impression: Left pes planus without rocker foot deformity.   New Medications:  No orders of the defined types were placed in this encounter.     Mordecai Rasmussen, MD  03/20/2020 9:29 PM

## 2020-03-26 ENCOUNTER — Encounter: Payer: Self-pay | Admitting: Family Medicine

## 2020-03-26 ENCOUNTER — Other Ambulatory Visit: Payer: Self-pay

## 2020-03-26 ENCOUNTER — Ambulatory Visit: Payer: Medicaid Other | Admitting: Family Medicine

## 2020-03-26 VITALS — BP 119/79 | HR 103 | Temp 97.4°F | Ht 67.0 in | Wt 241.8 lb

## 2020-03-26 DIAGNOSIS — R2242 Localized swelling, mass and lump, left lower limb: Secondary | ICD-10-CM | POA: Diagnosis not present

## 2020-03-26 NOTE — Patient Instructions (Signed)
Flat Feet, Adult  Flat feet is a common condition in which there is no curve, or arch, on the inner sides of the feet. Normally, an adult foot has an arch. The arch creates a gap between the foot and the ground. This condition can occur in one foot or in both feet. What are the causes? This condition may be caused by:  An injury to tendons and ligaments in the foot, such as to the tendon that supports the arch (posterior tibial tendon). Tendons are tissues that connect muscle to bone, and ligaments are tissues that connect bones to each other.  Loose tendons or ligaments in your foot.  A wearing down of your arch over time.  Injury to bones in your foot.  An abnormality in the bones of your foot called tarsal coalition. This happens when two or more bones in the foot are joined together (fused). Tarsal coalition is often present at birth, but signs of it typically do not show until the early teen years. What increases the risk? The following factors may make you more likely to develop this condition:  Being an adult age 30 or older.  Having a family history of flat feet or a history of childhood flexible flatfoot.  Having obesity, diabetes, or high blood pressure.  Taking part in high-impact sports.  Having inflammatory arthritis.  Having had any of these problems with the bones in your foot: ? A broken bone (fracture). ? Bones that were moved out of place (dislocated). ? Achilles tendon injuries. What are the signs or symptoms? Symptoms of this condition include:  Pain or tightness along the bottom of your foot.  Foot pain that gets worse with activity.  Swelling of the inner side of your foot or swelling of your ankle.  Pain on the outer side of your ankle.  Changes in gait. Your gait is the way that you walk.  Pronation. This is when the foot and ankle lean inward when you are standing.  Bony bumps on the top or inner side of your foot. How is this diagnosed? This  condition is diagnosed with a physical exam of your foot and ankle. Your health care provider may also:  Check your shoes for patterns of wear on the soles.  Order imaging tests, such as X-rays to see the bone structure.  Refer you to a health care provider who specializes in feet (podiatrist). How is this treated? This condition may be treated with:  Rest and ice to decrease inflammation and pain in the affected foot.  Stretching or physical therapy exercises. These are done to: ? Improve movement and strength in your foot. ? Increase range of motion and relieve pain.  A shoe insert (orthotic insert) for one foot or both feet. This helps to support the arch of your foot. An orthotic insert or inserts can be purchased from a store or can be custom-made.  Wearing shoes with appropriate arch support. This is especially important for athletes.  Medicines. These may be prescribed or injected into the affected foot to relieve pain.  An ankle boot or cast or a foot or leg brace to relieve pressure on your affected foot.  Surgery. This may be done to improve the alignment of your foot. Surgery is needed only if your posterior tibial tendon is torn or if you have tarsal coalition.   Follow these instructions at home: Managing pain, stiffness, and swelling  If directed, put ice on the painful area. To do this: ?  If you have a removable brace or boot, remove it as told by your health care provider. ? Put ice in a plastic bag. ? Place a towel between your skin and the bag or between your cast and the bag. ? Leave the ice on for 20 minutes, 2-3 times a day. ? Remove the ice if your skin turns bright red. This is very important. If you cannot feel pain, heat, or cold, you have a greater risk of damage to the area.  Move your toes often to reduce stiffness and swelling.  Raise (elevate) the injured area above the level of your heart while you are sitting or lying down. Activity  Do  exercises as told by your health care provider, if they were prescribed.  If an activity causes pain, avoid it or try to find another activity that does not cause pain. General instructions  Wear an orthotic insert and appropriate shoes as told by your health care provider.  Take over-the-counter and prescription medicines only as told by your health care provider.  Wear a brace, boot, or cast as told by your health care provider.  Keep all follow-up visits. This is important. How is this prevented? To prevent the condition from getting worse:  Wear comfortable, supportive shoes that are appropriate for your activities.  Maintain a healthy weight.  Stay active in a way that your health care provider recommends. This will help to keep your feet flexible and strong.  Manage long-term (chronic) health conditions, such as diabetes, high blood pressure, and inflammatory arthritis.  Work with a health care provider if you have concerns about your feet or shoes. Contact a health care provider if you have:  Pain in your foot or lower leg that gets worse or does not improve with medicine.  Pain or trouble when walking.  Problems with your orthotic insert. Summary  Flat feet is a common condition in which there is no curve, or arch, on the inner sides of the feet. This condition can occur in one foot or in both feet.  Your health care provider may recommend a shoe insert (orthotic insert) for one foot or both feet, or shoes with the appropriate arch support.  Other treatments may include stretching or physical therapy exercises, pain medicines, and wearing a foot or leg brace or an ankle boot or cast.  Surgery may be done if you have a tear in the tendon that supports your arch (posterior tibial tendon) or if two or more of your foot bones were joined together, or fused,before birth (tarsal coalition). This information is not intended to replace advice given to you by your health care  provider. Make sure you discuss any questions you have with your health care provider. Document Revised: 06/22/2019 Document Reviewed: 06/22/2019 Elsevier Patient Education  2021 [employer].

## 2020-03-26 NOTE — Progress Notes (Signed)
Assessment & Plan:  1. Localized swelling of left foot Education provided on pes planus with emphasis on wearing down of the arch over time, injury to bones of the foot, and obesity.  Lab work-up to ensure there is not another cause of her symptoms. - Anemia Profile B - CMP14+EGFR - Thyroid Panel With TSH - Uric acid   Follow up plan: Return if symptoms worsen or fail to improve.  Hendricks Limes, MSN, APRN, FNP-C [facility]  Subjective:   Patient ID: Tiffany [name]. Tiffany [name], female    DOB: [birth year], 30 y.o.   MRN: [id number]  HPI: Tiffany [name] is a 30 y.o. female presenting on 03/26/2020 for Foot Pain (Patient states she has been having on and off left foot pain and swelling x 1 yr. )  Patient reports left foot pain and swelling that has been occurring for at least the past year.  She reports it is now occurring in the right foot as well.  She was seen 2 weeks ago at which time she was given a prednisone taper that she just started 2 days ago.  She denies any improvement in her symptoms with the prednisone.  She has had 2 x-rays of her foot over the past 2 weeks which revealed pes planus.  She has seen an orthopedic, last week, who advised her to wrap her foot prior to going to work and wear shoes with good arch supports/inserts.  He felt her pain and swelling was due to the pes planus.  Patient states she tried wrapping her foot only to have to unwrap it because it was swelling underneath it.  States she has tried compression stockings in the past which did the same thing.  Denies any warmth in her feet.  Would like to be worked up for gout due to a family history of gout.  She does have a history of surgery on her left ankle and leg.   ROS: Negative unless specifically indicated above in HPI.   Relevant past medical history reviewed and updated as indicated.   Allergies and medications reviewed and updated.   Current Outpatient Medications:  .  PARoxetine (PAXIL) 20  MG tablet, Take 1 tablet (20 mg total) by mouth daily. (Patient taking differently: Take 20 mg by mouth at bedtime.), Disp: 30 tablet, Rfl: 2 .  predniSONE (STERAPRED UNI-PAK 21 TAB) 10 MG (21) TBPK tablet, Use as directed on back of pill pack, Disp: 21 tablet, Rfl: 0 .  traZODone (DESYREL) 50 MG tablet, 25-50 mg at night for insomnia (Patient taking differently: Take 50 mg by mouth at bedtime as needed for sleep. 25-50 mg at night for insomnia), Disp: 90 tablet, Rfl: 0  Allergies  Allergen Reactions  . Other Itching and Swelling    Coconut    Objective:   BP 119/79   Pulse (!) 103   Temp (!) 97.4 F (36.3 C) (Temporal)   Ht _0  (1.702 m)   Wt 241 lb 12.8 oz (109.7 kg)   SpO2 98%   BMI 37.87 kg/m    Physical Exam Vitals reviewed.  Constitutional:      General: She is not in acute distress.    Appearance: Normal appearance. She is not ill-appearing, toxic-appearing or diaphoretic.  HENT:     Head: Normocephalic and atraumatic.  Eyes:     General: No scleral icterus.       Right eye: No discharge.        Left eye: No  discharge.     Conjunctiva/sclera: Conjunctivae normal.  Cardiovascular:     Rate and Rhythm: Normal rate.  Pulmonary:     Effort: Pulmonary effort is normal. No respiratory distress.  Musculoskeletal:        General: Normal range of motion.     Cervical back: Normal range of motion.     Right lower leg: No edema.     Left lower leg: No edema.  Skin:    General: Skin is warm and dry.     Capillary Refill: Capillary refill takes less than 2 seconds.  Neurological:     General: No focal deficit present.     Mental Status: She is alert and oriented to person, place, and time. Mental status is at baseline.  Psychiatric:        Mood and Affect: Mood normal.        Behavior: Behavior normal.        Thought Content: Thought content normal.        Judgment: Judgment normal.

## 2020-03-27 LAB — CMP14+EGFR
ALT: 15 IU/L (ref 0–32)
AST: 10 IU/L (ref 0–40)
Albumin/Globulin Ratio: 1.9 (ref 1.2–2.2)
Albumin: 4.2 g/dL (ref 3.9–5.0)
Alkaline Phosphatase: 63 IU/L (ref 44–121)
BUN/Creatinine Ratio: 10 (ref 9–23)
BUN: 10 mg/dL (ref 6–20)
Bilirubin Total: 0.2 mg/dL (ref 0.0–1.2)
CO2: 23 mmol/L (ref 20–29)
Calcium: 9.6 mg/dL (ref 8.7–10.2)
Chloride: 103 mmol/L (ref 96–106)
Creatinine, Ser: 0.97 mg/dL (ref 0.57–1.00)
Globulin, Total: 2.2 g/dL (ref 1.5–4.5)
Glucose: 104 mg/dL — ABNORMAL HIGH (ref 65–99)
Potassium: 4.3 mmol/L (ref 3.5–5.2)
Sodium: 141 mmol/L (ref 134–144)
Total Protein: 6.4 g/dL (ref 6.0–8.5)
eGFR: 81 mL/min/{1.73_m2} (ref >59)

## 2020-03-27 LAB — ANEMIA PROFILE B
Basophils Absolute: 0 10*3/uL (ref 0.0–0.2)
Basos: 0 %
EOS (ABSOLUTE): 0.3 10*3/uL (ref 0.0–0.4)
Eos: 4 %
Ferritin: 99 ng/mL (ref 15–150)
Folate: 5.6 ng/mL (ref >3.0)
Hematocrit: 41.7 % (ref 34.0–46.6)
Hemoglobin: 14 g/dL (ref 11.1–15.9)
Immature Grans (Abs): 0 10*3/uL (ref 0.0–0.1)
Immature Granulocytes: 0 %
Iron Saturation: 21 % (ref 15–55)
Iron: 62 ug/dL (ref 27–159)
Lymphocytes Absolute: 2.9 10*3/uL (ref 0.7–3.1)
Lymphs: 41 %
MCH: 26.9 pg (ref 26.6–33.0)
MCHC: 33.6 g/dL (ref 31.5–35.7)
MCV: 80 fL (ref 79–97)
Monocytes Absolute: 0.4 10*3/uL (ref 0.1–0.9)
Monocytes: 6 %
Neutrophils Absolute: 3.4 10*3/uL (ref 1.4–7.0)
Neutrophils: 49 %
Platelets: 349 10*3/uL (ref 150–450)
RBC: 5.2 x10E6/uL (ref 3.77–5.28)
RDW: 14.5 % (ref 11.7–15.4)
Retic Ct Pct: 0.9 % (ref 0.6–2.6)
Total Iron Binding Capacity: 292 ug/dL (ref 250–450)
UIBC: 230 ug/dL (ref 131–425)
Vitamin B-12: 567 pg/mL (ref 232–1245)
WBC: 7.1 10*3/uL (ref 3.4–10.8)

## 2020-03-27 LAB — THYROID PANEL WITH TSH
Free Thyroxine Index: 2.1 (ref 1.2–4.9)
T3 Uptake Ratio: 26 % (ref 24–39)
T4, Total: 8.2 ug/dL (ref 4.5–12.0)
TSH: 2.05 u[IU]/mL (ref 0.450–4.500)

## 2020-03-27 LAB — URIC ACID: Uric Acid: 4.9 mg/dL (ref 2.6–6.2)

## 2020-03-28 ENCOUNTER — Encounter: Payer: Self-pay | Admitting: Family Medicine

## 2020-04-14 DIAGNOSIS — H5213 Myopia, bilateral: Secondary | ICD-10-CM | POA: Diagnosis not present

## 2020-05-26 ENCOUNTER — Other Ambulatory Visit: Payer: Self-pay | Admitting: Family Medicine

## 2020-05-26 DIAGNOSIS — G8929 Other chronic pain: Secondary | ICD-10-CM

## 2020-06-06 ENCOUNTER — Telehealth: Payer: Self-pay

## 2020-06-19 ENCOUNTER — Encounter: Payer: Self-pay | Admitting: Family Medicine

## 2020-06-19 ENCOUNTER — Ambulatory Visit: Payer: Medicaid Other | Admitting: Family Medicine

## 2020-06-19 ENCOUNTER — Other Ambulatory Visit: Payer: Self-pay

## 2020-06-19 ENCOUNTER — Other Ambulatory Visit (HOSPITAL_COMMUNITY)
Admission: RE | Admit: 2020-06-19 | Discharge: 2020-06-19 | Disposition: A | Discharge status: Home / Self Care | Payer: Medicaid Other | Source: Ambulatory Visit | Attending: Family Medicine | Admitting: Family Medicine

## 2020-06-19 VITALS — BP 116/75 | HR 86 | Temp 98.0°F | Ht 67.0 in | Wt 251.0 lb

## 2020-06-19 DIAGNOSIS — J302 Other seasonal allergic rhinitis: Secondary | ICD-10-CM | POA: Diagnosis not present

## 2020-06-19 DIAGNOSIS — Z0000 Encounter for general adult medical examination without abnormal findings: Secondary | ICD-10-CM | POA: Diagnosis not present

## 2020-06-19 DIAGNOSIS — G479 Sleep disorder, unspecified: Secondary | ICD-10-CM | POA: Diagnosis not present

## 2020-06-19 DIAGNOSIS — Z0001 Encounter for general adult medical examination with abnormal findings: Secondary | ICD-10-CM | POA: Diagnosis not present

## 2020-06-19 DIAGNOSIS — Z124 Encounter for screening for malignant neoplasm of cervix: Secondary | ICD-10-CM | POA: Diagnosis not present

## 2020-06-19 DIAGNOSIS — F331 Major depressive disorder, recurrent, moderate: Secondary | ICD-10-CM | POA: Diagnosis not present

## 2020-06-19 DIAGNOSIS — F411 Generalized anxiety disorder: Secondary | ICD-10-CM

## 2020-06-19 DIAGNOSIS — Z113 Encounter for screening for infections with a predominantly sexual mode of transmission: Secondary | ICD-10-CM | POA: Insufficient documentation

## 2020-06-19 MED ORDER — LEVOCETIRIZINE DIHYDROCHLORIDE 5 MG PO TABS: 5.0000 mg | ORAL_TABLET | Freq: Every evening | ORAL | 1 refills | Status: DC

## 2020-06-19 MED ORDER — TRAZODONE HCL 50 MG PO TABS: ORAL_TABLET | ORAL | 3 refills | Status: DC

## 2020-06-19 MED ORDER — FLONASE SENSIMIST 27.5 MCG/SPRAY NA SUSP
2.0000 | Freq: Every day | NASAL | 12 refills | Status: DC
Start: 2020-06-19 — End: 2020-07-30

## 2020-06-19 NOTE — Progress Notes (Signed)
Assessment & Plan:  1. Well adult exam Preventive health education provided.  Patient declined hepatitis C screening. - CBC with Differential/Platelet - CMP14+EGFR - Lipid panel  2. MDD (major depressive disorder), recurrent episode, moderate (HCC) Uncontrolled.  GeneSight testing completed today.  3. Generalized anxiety disorder Uncontrolled.  GeneSight testing completed today.  4. Difficulty sleeping Well controlled on current regimen.  - traZODone (DESYREL) 50 MG tablet; 25-50 mg at night for insomnia  Dispense: 90 tablet; Refill: 3  5. Seasonal allergies Uncontrolled.  Started patient on Xyzal and Flonase sensimist.  - levocetirizine (XYZAL) 5 MG tablet; Take 1 tablet (5 mg total) by mouth every evening.  Dispense: 90 tablet; Refill: 1 - fluticasone (FLONASE SENSIMIST) 27.5 MCG/SPRAY nasal spray; Place 2 sprays into the nose daily.  Dispense: 10 g; Refill: 12  6. Screening for cervical cancer - Cytology - PAP  7. Routine screening for STI (sexually transmitted infection) - Cytology - PAP   Follow-up: Return in about 2 months (around 08/19/2020) for anxiety/depression.   Tiffany Limes, MSN, APRN, FNP-C [facility]  Subjective:  Patient ID: Tiffany [name]. Tiffany [name], female    DOB: [birth year]  Age: 30 y.o. MRN: [id number]  Patient Care Team: Loman Brooklyn, FNP as PCP - General (Family Medicine) Estill Dooms, NP as Nurse Practitioner (Obstetrics and Gynecology)   CC:  Chief Complaint  Patient presents with  . Gynecologic Exam    HPI Tiffany [name]. Tiffany [name] presents for her annual physical.   Occupation: Makes shirts at [employer] Marital status: Single, Substance use: None Diet: Regular, Exercise: None Last eye exam: A few months ago Last dental exam: A year ago Last pap smear: 01/13/2017 Hepatitis C Screening: Declined Immunizations: Flu Vaccine: not flu season Tdap Vaccine: up to date  COVID-19 Vaccine: up to  date  Depression/Anxiety: does not feel well controlled. States Paxil makes her feel like a zombie. Has previously failed treatment with Lexapro and Buspirone.   Depression screen [facility] 2/9 06/19/2020 03/12/2020 11/23/2018  Decreased Interest 2 0 1  Down, Depressed, Hopeless 3 1 0  PHQ - 2 Score _0 Altered sleeping 2 - 0  Tired, decreased energy 2 - 3  Change in appetite 0 - 0  Feeling bad or failure about yourself  3 - 1  Trouble concentrating 0 - 0  Moving slowly or fidgety/restless 2 - 0  Suicidal thoughts 0 - 0  PHQ-9 Score 14 - 5  Difficult doing work/chores Very difficult - Not difficult at all  Some recent data might be hidden   GAD 7 : Generalized Anxiety Score 06/19/2020 11/23/2018 10/12/2018  Nervous, Anxious, on Edge _1 Control/stop worrying _2 Worry too much - different things _3 Trouble relaxing _4 Restless 1 0 1  Easily annoyed or irritable _5 Afraid - awful might happen 0 1 0  Total GAD 7 Score _6 Anxiety Difficulty Very difficult Somewhat difficult Very difficult    Insomnia: controlled with Trazodone. Mostly takes 50 mg at bedtime, but will sometimes only take 25 mg.   Review of Systems  Constitutional: Negative for chills, fever, malaise/fatigue and weight loss.  HENT: Positive for ear pain (left ear). Negative for congestion, ear discharge, nosebleeds, sinus pain, sore throat and tinnitus.   Eyes: Negative for blurred vision, double vision, pain, discharge and redness.  Respiratory: Negative for cough, shortness of breath and wheezing.  Cardiovascular: Negative for chest pain, palpitations and leg swelling.  Gastrointestinal: Negative for abdominal pain, constipation, diarrhea, heartburn, nausea and vomiting.  Genitourinary: Negative for dysuria, frequency and urgency.  Musculoskeletal: Negative for myalgias.  Skin: Negative for rash.  Neurological: Positive for dizziness. Negative for seizures, weakness and headaches.   Psychiatric/Behavioral: Positive for depression. Negative for substance abuse and suicidal ideas. The patient is nervous/anxious.      Current Outpatient Medications:  .  PARoxetine (PAXIL) 20 MG tablet, Take 1 tablet (20 mg total) by mouth daily. (Patient taking differently: Take 20 mg by mouth at bedtime.), Disp: 30 tablet, Rfl: 2 .  traZODone (DESYREL) 50 MG tablet, 25-50 mg at night for insomnia (Patient taking differently: Take 50 mg by mouth at bedtime as needed for sleep. 25-50 mg at night for insomnia), Disp: 90 tablet, Rfl: 0  Allergies  Allergen Reactions  . Other Itching and Swelling    Coconut    Past Medical History:  Diagnosis Date  . Anxiety   . Chronic back pain   . Depression     Past Surgical History:  Procedure Laterality Date  . EXTERNAL EAR SURGERY Left   . LAPAROSCOPIC BILATERAL SALPINGECTOMY Bilateral 02/21/2020  . LAPAROSCOPIC BILATERAL SALPINGECTOMY Bilateral 02/21/2020   Procedure: LAPAROSCOPIC BILATERAL TUBAL LIGATION, BILATERAL SALPINGECTOMY TECHNIQUE;  Surgeon: Florian Buff, MD;  Location: [facility];  Service: Gynecology;  Laterality: Bilateral;  . LEG SURGERY Left     Family History  Problem Relation Age of Onset  . Depression Mother   . Asthma Sister   . Asthma Sister   . Depression Maternal Grandmother   . Hypertension Maternal Grandfather   . Other Maternal Grandfather        blood clots    Social History   Socioeconomic History  . Marital status: Single    Spouse name: Not on file  . Number of children: 2  . Years of education: Not on file  . Highest education level: Not on file  Occupational History  . Not on file  Tobacco Use  . Smoking status: Current Every Day Smoker    Years: 2.00    Types: Cigarettes  . Smokeless tobacco: Never Used  Vaping Use  . Vaping Use: Never used  Substance and Sexual Activity  . Alcohol use: Yes    Comment: occ  . Drug use: Not Currently    Types: Marijuana    Comment: used in the past  .  Sexual activity: Yes    Birth control/protection: Injection  Other Topics Concern  . Not on file  Social History Narrative  . Not on file   Social Determinants of Health   Financial Resource Strain: Not on file  Food Insecurity: Not on file  Transportation Needs: Not on file  Physical Activity: Not on file  Stress: Not on file  Social Connections: Not on file  Intimate Partner Violence: Not on file      Objective:    BP 116/75   Pulse 86   Temp 98 F (36.7 C) (Temporal)   Ht _0  (1.702 m)   Wt 251 lb (113.9 kg)   LMP  (Approximate)   SpO2 97%   BMI 39.31 kg/m   Wt Readings from Last 3 Encounters:  06/19/20 251 lb (113.9 kg)  03/26/20 241 lb 12.8 oz (109.7 kg)  03/20/20 240 lb (108.9 kg)    Physical Exam Vitals reviewed. Exam conducted with a chaperone present.  Constitutional:      General:  She is not in acute distress.    Appearance: Normal appearance. She is not ill-appearing, toxic-appearing or diaphoretic.  HENT:     Head: Normocephalic and atraumatic.     Right Ear: Tympanic membrane, ear canal and external ear normal. There is no impacted cerumen.     Left Ear: Ear canal and external ear normal. No drainage or swelling.  No middle ear effusion. There is no impacted cerumen. Tympanic membrane is bulging. Tympanic membrane is not injected, erythematous or retracted.     Nose: Nose normal. No congestion or rhinorrhea.     Mouth/Throat:     Mouth: Mucous membranes are moist.     Pharynx: Oropharynx is clear. No oropharyngeal exudate or posterior oropharyngeal erythema.  Eyes:     General: No scleral icterus.       Right eye: No discharge.        Left eye: No discharge.     Conjunctiva/sclera: Conjunctivae normal.     Pupils: Pupils are equal, round, and reactive to light.  Cardiovascular:     Rate and Rhythm: Normal rate and regular rhythm.     Heart sounds: Normal heart sounds. No murmur heard. No friction rub. No gallop.   Pulmonary:     Effort:  Pulmonary effort is normal. No respiratory distress.     Breath sounds: Normal breath sounds. No stridor. No wheezing, rhonchi or rales.  Chest:  Breasts: Breasts are symmetrical.     Right: Normal. No axillary adenopathy or supraclavicular adenopathy.     Left: Normal. No axillary adenopathy or supraclavicular adenopathy.    Abdominal:     General: Abdomen is flat. Bowel sounds are normal. There is no distension.     Palpations: Abdomen is soft. There is no mass.     Tenderness: There is no abdominal tenderness. There is no guarding or rebound.     Hernia: No hernia is present.  Genitourinary:    Pubic Area: No rash or pubic lice.      Labia:        Right: No rash, tenderness, lesion or injury.        Left: No rash, tenderness, lesion or injury.      Vagina: No signs of injury and foreign body. Vaginal discharge present. No erythema, tenderness, bleeding, lesions or prolapsed vaginal walls.     Cervix: Normal.     Uterus: Normal.      Adnexa: Right adnexa normal and left adnexa normal.  Musculoskeletal:        General: Normal range of motion.     Cervical back: Normal range of motion and neck supple. No rigidity. No muscular tenderness.  Lymphadenopathy:     Cervical: No cervical adenopathy.     Upper Body:     Right upper body: No supraclavicular, axillary or pectoral adenopathy.     Left upper body: No supraclavicular, axillary or pectoral adenopathy.  Skin:    General: Skin is warm and dry.     Capillary Refill: Capillary refill takes less than 2 seconds.  Neurological:     General: No focal deficit present.     Mental Status: She is alert and oriented to person, place, and time. Mental status is at baseline.  Psychiatric:        Mood and Affect: Mood normal.        Behavior: Behavior normal.        Thought Content: Thought content normal.        Judgment: Judgment normal.  Lab Results  Component Value Date   TSH 2.050 03/26/2020   Lab Results  Component Value  Date   WBC 7.1 03/26/2020   HGB 14.0 03/26/2020   HCT 41.7 03/26/2020   MCV 80 03/26/2020   PLT 349 03/26/2020   Lab Results  Component Value Date   NA 141 03/26/2020   K 4.3 03/26/2020   CO2 23 03/26/2020   GLUCOSE 104 (H) 03/26/2020   BUN 10 03/26/2020   CREATININE 0.97 03/26/2020   BILITOT 0.2 03/26/2020   ALKPHOS 63 03/26/2020   AST 10 03/26/2020   ALT 15 03/26/2020   PROT 6.4 03/26/2020   ALBUMIN 4.2 03/26/2020   CALCIUM 9.6 03/26/2020   ANIONGAP 8 02/05/2020   EGFR 81 03/26/2020   No results found for: CHOL No results found for: HDL No results found for: LDLCALC No results found for: TRIG No results found for: CHOLHDL No results found for: HGBA1C

## 2020-06-19 NOTE — Patient Instructions (Signed)
You can continue Paxil until we start a new medication.    Preventive Care 92-30 Years Old, Female Preventive care refers to lifestyle choices and visits with your health care provider that can promote health and wellness. This includes:  A yearly physical exam. This is also called an annual wellness visit.  Regular dental and eye exams.  Immunizations.  Screening for certain conditions.  Healthy lifestyle choices, such as: ? Eating a healthy diet. ? Getting regular exercise. ? Not using drugs or products that contain nicotine and tobacco. ? Limiting alcohol use. What can I expect for my preventive care visit? Physical exam Your health care provider may check your:  Height and weight. These may be used to calculate your BMI (body mass index). BMI is a measurement that tells if you are at a healthy weight.  Heart rate and blood pressure.  Body temperature.  Skin for abnormal spots. Counseling Your health care provider may ask you questions about your:  Past medical problems.  Family's medical history.  Alcohol, tobacco, and drug use.  Emotional well-being.  Home life and relationship well-being.  Sexual activity.  Diet, exercise, and sleep habits.  Work and work Statistician.  Access to firearms.  Method of birth control.  Menstrual cycle.  Pregnancy history. What immunizations do I need? Vaccines are usually given at various ages, according to a schedule. Your health care provider will recommend vaccines for you based on your age, medical history, and lifestyle or other factors, such as travel or where you work.   What tests do I need? Blood tests  Lipid and cholesterol levels. These may be checked every 5 years starting at age 13.  Hepatitis C test.  Hepatitis B test. Screening  Diabetes screening. This is done by checking your blood sugar (glucose) after you have not eaten for a while (fasting).  STD (sexually transmitted disease) testing, if you  are at risk.  BRCA-related cancer screening. This may be done if you have a family history of breast, ovarian, tubal, or peritoneal cancers.  Pelvic exam and Pap test. This may be done every 3 years starting at age 76. Starting at age 6, this may be done every 5 years if you have a Pap test in combination with an HPV test. Talk with your health care provider about your test results, treatment options, and if necessary, the need for more tests.   Follow these instructions at home: Eating and drinking  Eat a healthy diet that includes fresh fruits and vegetables, whole grains, lean protein, and low-fat dairy products.  Take vitamin and mineral supplements as recommended by your health care provider.  Do not drink alcohol if: ? Your health care provider tells you not to drink. ? You are pregnant, may be pregnant, or are planning to become pregnant.  If you drink alcohol: ? Limit how much you have to 0-1 drink a day. ? Be aware of how much alcohol is in your drink. In the [place] one drink equals one 12 oz bottle of beer (355 mL), one 5 oz glass of wine (148 mL), or one 1 oz glass of hard liquor (44 mL).   Lifestyle  Take daily care of your teeth and gums. Brush your teeth every morning and night with fluoride toothpaste. Floss one time each day.  Stay active. Exercise for at least 30 minutes 5 or more days each week.  Do not use any products that contain nicotine or tobacco, such as cigarettes, e-cigarettes, and chewing tobacco.  If you need help quitting, ask your health care provider.  Do not use drugs.  If you are sexually active, practice safe sex. Use a condom or other form of protection to prevent STIs (sexually transmitted infections).  If you do not wish to become pregnant, use a form of birth control. If you plan to become pregnant, see your health care provider for a prepregnancy visit.  Find healthy ways to cope with stress, such as: ? Meditation, yoga, or listening to  music. ? Journaling. ? Talking to a trusted person. ? Spending time with friends and family. Safety  Always wear your seat belt while driving or riding in a vehicle.  Do not drive: ? If you have been drinking alcohol. Do not ride with someone who has been drinking. ? When you are tired or distracted. ? While texting.  Wear a helmet and other protective equipment during sports activities.  If you have firearms in your house, make sure you follow all gun safety procedures.  Seek help if you have been physically or sexually abused. What's next?  Go to your health care provider once a year for an annual wellness visit.  Ask your health care provider how often you should have your eyes and teeth checked.  Stay up to date on all vaccines. This information is not intended to replace advice given to you by your health care provider. Make sure you discuss any questions you have with your health care provider. Document Revised: 09/10/2019 Document Reviewed: 09/23/2017 Elsevier Patient Education  2021 [employer].

## 2020-06-20 LAB — CMP14+EGFR
ALT: 31 IU/L (ref 0–32)
AST: 25 IU/L (ref 0–40)
Albumin/Globulin Ratio: 2 (ref 1.2–2.2)
Albumin: 4.5 g/dL (ref 3.9–5.0)
Alkaline Phosphatase: 68 IU/L (ref 44–121)
BUN/Creatinine Ratio: 16 (ref 9–23)
BUN: 14 mg/dL (ref 6–20)
Bilirubin Total: <0.2 mg/dL (ref 0.0–1.2)
CO2: 24 mmol/L (ref 20–29)
Calcium: 9.8 mg/dL (ref 8.7–10.2)
Chloride: 101 mmol/L (ref 96–106)
Creatinine, Ser: 0.89 mg/dL (ref 0.57–1.00)
Globulin, Total: 2.3 g/dL (ref 1.5–4.5)
Glucose: 98 mg/dL (ref 65–99)
Potassium: 4.5 mmol/L (ref 3.5–5.2)
Sodium: 139 mmol/L (ref 134–144)
Total Protein: 6.8 g/dL (ref 6.0–8.5)
eGFR: 90 mL/min/{1.73_m2} (ref >59)

## 2020-06-20 LAB — CBC WITH DIFFERENTIAL/PLATELET
Basophils Absolute: 0.1 10*3/uL (ref 0.0–0.2)
Basos: 1 %
EOS (ABSOLUTE): 0.4 10*3/uL (ref 0.0–0.4)
Eos: 5 %
Hematocrit: 43.6 % (ref 34.0–46.6)
Hemoglobin: 14.1 g/dL (ref 11.1–15.9)
Immature Grans (Abs): 0 10*3/uL (ref 0.0–0.1)
Immature Granulocytes: 0 %
Lymphocytes Absolute: 3.1 10*3/uL (ref 0.7–3.1)
Lymphs: 42 %
MCH: 26 pg — ABNORMAL LOW (ref 26.6–33.0)
MCHC: 32.3 g/dL (ref 31.5–35.7)
MCV: 80 fL (ref 79–97)
Monocytes Absolute: 0.5 10*3/uL (ref 0.1–0.9)
Monocytes: 6 %
Neutrophils Absolute: 3.3 10*3/uL (ref 1.4–7.0)
Neutrophils: 46 %
Platelets: 394 10*3/uL (ref 150–450)
RBC: 5.42 x10E6/uL — ABNORMAL HIGH (ref 3.77–5.28)
RDW: 13.8 % (ref 11.7–15.4)
WBC: 7.3 10*3/uL (ref 3.4–10.8)

## 2020-06-20 LAB — LIPID PANEL
Chol/HDL Ratio: 2.3 ratio (ref 0.0–4.4)
Cholesterol, Total: 154 mg/dL (ref 100–199)
HDL: 66 mg/dL (ref >39)
LDL Chol Calc (NIH): 75 mg/dL (ref 0–99)
Triglycerides: 66 mg/dL (ref 0–149)
VLDL Cholesterol Cal: 13 mg/dL (ref 5–40)

## 2020-06-25 LAB — CYTOLOGY - PAP
Adequacy: ABSENT
Chlamydia: NEGATIVE
Comment: NEGATIVE
Comment: NEGATIVE
Comment: NORMAL
Neisseria Gonorrhea: NEGATIVE
Trichomonas: NEGATIVE

## 2020-06-26 ENCOUNTER — Other Ambulatory Visit: Payer: Self-pay | Admitting: Family Medicine

## 2020-06-26 ENCOUNTER — Telehealth: Payer: Self-pay | Admitting: Family Medicine

## 2020-06-26 DIAGNOSIS — R87612 Low grade squamous intraepithelial lesion on cytologic smear of cervix (LGSIL): Secondary | ICD-10-CM

## 2020-06-26 MED ORDER — CITALOPRAM HYDROBROMIDE 20 MG PO TABS: 20.0000 mg | ORAL_TABLET | Freq: Every day | ORAL | 2 refills | Status: DC

## 2020-06-26 NOTE — Telephone Encounter (Signed)
Please let patient know I received her GeneSight results and recommend Celexa. I have sent a prescription for this to the pharmacy. I will put a copy of her results in the mail to her.

## 2020-07-22 ENCOUNTER — Ambulatory Visit (INDEPENDENT_AMBULATORY_CARE_PROVIDER_SITE_OTHER): Payer: Medicaid Other | Admitting: Obstetrics & Gynecology

## 2020-07-22 ENCOUNTER — Encounter: Payer: Self-pay | Admitting: Obstetrics & Gynecology

## 2020-07-22 ENCOUNTER — Other Ambulatory Visit: Payer: Self-pay

## 2020-07-22 VITALS — BP 126/86 | HR 84 | Ht 67.0 in | Wt 251.0 lb

## 2020-07-22 DIAGNOSIS — N870 Mild cervical dysplasia: Secondary | ICD-10-CM

## 2020-07-22 DIAGNOSIS — Z3202 Encounter for pregnancy test, result negative: Secondary | ICD-10-CM | POA: Diagnosis not present

## 2020-07-22 LAB — POCT URINE PREGNANCY: Preg Test, Ur: NEGATIVE

## 2020-07-22 MED ORDER — MEDROXYPROGESTERONE ACETATE 10 MG PO TABS: 10.0000 mg | ORAL_TABLET | Freq: Every day | ORAL | 0 refills | Status: DC

## 2020-07-22 NOTE — Progress Notes (Signed)
    Colposcopy Procedure Note:    Colposcopy Procedure Note  Indications:  2022 LSIL/negative HPV 16&18    2019 ASCCP recommendation:  Smoker:  Yes.   1/2 ppd New sexual partner:  No.  : time frame:    History of abnormal Pap: no  Procedure Details  The risks and benefits of the procedure and Written informed consent obtained.  Speculum placed in vagina and excellent visualization of cervix achieved, cervix swabbed x 3 with acetic acid solution.  Findings: Adequate colposcopy is noted today.  Cervix: no visible lesions, no mosaicism, no punctation, and no abnormal vasculature; SCJ visualized 360 degrees without lesions and no biopsies taken. Vaginal inspection: normal without visible lesions. Vulvar colposcopy: vulvar colposcopy not performed.  Specimens: none  Complications: none.  Colposcopic Impression:   Plan(Based on 2019 ASCCP recommendations) Follow up HPV based cytology in 1 year

## 2020-07-30 ENCOUNTER — Other Ambulatory Visit: Payer: Self-pay

## 2020-07-30 ENCOUNTER — Ambulatory Visit: Payer: Medicaid Other | Admitting: Nurse Practitioner

## 2020-07-30 ENCOUNTER — Encounter: Payer: Self-pay | Admitting: Nurse Practitioner

## 2020-07-30 VITALS — BP 92/62 | HR 97 | Temp 98.1°F | Ht 67.0 in | Wt 253.0 lb

## 2020-07-30 DIAGNOSIS — B029 Zoster without complications: Secondary | ICD-10-CM | POA: Insufficient documentation

## 2020-07-30 MED ORDER — HYDROCORTISONE 1 % EX LOTN: 1.0000 "application " | TOPICAL_LOTION | Freq: Two times a day (BID) | CUTANEOUS | 0 refills | Status: DC

## 2020-07-30 MED ORDER — ACYCLOVIR 800 MG PO TABS
800.0000 mg | ORAL_TABLET | Freq: Every day | ORAL | 0 refills | Status: DC
Start: 2020-07-30 — End: 2020-08-27

## 2020-07-30 NOTE — Patient Instructions (Signed)

## 2020-07-30 NOTE — Assessment & Plan Note (Signed)
ForPatient presents with left above eye brow rash erupted in the last 24 to 36 hours.  Rash is painful and goes up into patient's hair.  Patient has had a history of chickenpox.  Pain is affecting left eye, increased pruritus and discomfort.  Pain is moderate.  Conjunctive are clear  On assessment shingles.  Provided education to patient with printed handouts given lid 1% hydrocortisone cream topical for itch, ibuprofen/Tylenol for pain, acyclovir antiviral 800 mg tablet by mouth 5 times daily, follow-up in 3 days.  Rx sent to pharmacy.  Follow-up with worsening unresolved symptoms.

## 2020-07-30 NOTE — Progress Notes (Signed)
Acute Office Visit  Subjective:    Patient ID: Tiffany [name]. [name], female    DOB: Aug 15, 1990, 30 y.o.   MRN: [id number]  Chief Complaint  Patient presents with   Rash    Rash This is a new problem. Episode onset: 24 to 36 hours. The problem has been gradually worsening since onset. The affected locations include the face. The rash is characterized by blistering, redness and pain. She was exposed to nothing. Associated symptoms include eye pain. Pertinent negatives include no congestion, cough, facial edema, fatigue, fever or sore throat. Past treatments include nothing.    Past Medical History:  Diagnosis Date   Anxiety    Chronic back pain    Depression    Vaginal Pap smear, abnormal     Past Surgical History:  Procedure Laterality Date   EXTERNAL EAR SURGERY Left    LAPAROSCOPIC BILATERAL SALPINGECTOMY Bilateral 02/21/2020   LAPAROSCOPIC BILATERAL SALPINGECTOMY Bilateral 02/21/2020   Procedure: LAPAROSCOPIC BILATERAL TUBAL LIGATION, BILATERAL SALPINGECTOMY TECHNIQUE;  Surgeon: Florian Buff, MD;  Location: [facility];  Service: Gynecology;  Laterality: Bilateral;   LEG SURGERY Left     Family History  Problem Relation Age of Onset   Depression Mother    Asthma Sister    Asthma Sister    Depression Maternal Grandmother    Hypertension Maternal Grandfather    Other Maternal Grandfather        blood clots    Social History   Socioeconomic History   Marital status: Single    Spouse name: Not on file   Number of children: 2   Years of education: Not on file   Highest education level: Not on file  Occupational History   Not on file  Tobacco Use   Smoking status: Every Day    Years: 2.00    Pack years: 0.00    Types: Cigarettes   Smokeless tobacco: Never  Vaping Use   Vaping Use: Never used  Substance and Sexual Activity   Alcohol use: Yes    Comment: occ   Drug use: Not Currently    Types: Marijuana    Comment: used in the past   Sexual activity: Yes    Birth  control/protection: Surgical, Condom  Other Topics Concern   Not on file  Social History Narrative   Not on file   Social Determinants of Health   Financial Resource Strain: Not on file  Food Insecurity: Not on file  Transportation Needs: Not on file  Physical Activity: Not on file  Stress: Not on file  Social Connections: Not on file  Intimate Partner Violence: Not on file    Outpatient Medications Prior to Visit  Medication Sig Dispense Refill   citalopram (CELEXA) 20 MG tablet Take 1 tablet (20 mg total) by mouth daily. 30 tablet 2   levocetirizine (XYZAL) 5 MG tablet Take 1 tablet (5 mg total) by mouth every evening. 90 tablet 1   traZODone (DESYREL) 50 MG tablet 25-50 mg at night for insomnia 90 tablet 3   fluticasone (FLONASE SENSIMIST) 27.5 MCG/SPRAY nasal spray Place 2 sprays into the nose daily. (Patient not taking: Reported on 07/22/2020) 10 g 12   medroxyPROGESTERone (PROVERA) 10 MG tablet Take 1 tablet (10 mg total) by mouth daily. 10 tablet 0   No facility-administered medications prior to visit.    Allergies  Allergen Reactions   Other Itching and Swelling    Coconut    Review of Systems  Constitutional: Negative.  Negative  for fatigue and fever.  HENT:  Negative for congestion and sore throat.   Eyes:  Positive for pain.  Respiratory: Negative.  Negative for cough.   Gastrointestinal: Negative.   Skin:  Positive for rash.  All other systems reviewed and are negative.     Objective:    Physical Exam Vitals and nursing note reviewed.  HENT:     Head: Normocephalic.     Nose: Nose normal.  Eyes:     General: Lids are normal.        Left eye: No foreign body or discharge.     Conjunctiva/sclera: Conjunctivae normal.     Left eye: Left conjunctiva is not injected. No hemorrhage. Cardiovascular:     Rate and Rhythm: Regular rhythm. Tachycardia present.  Pulmonary:     Effort: Pulmonary effort is normal.     Breath sounds: Normal breath sounds.   Abdominal:     General: Bowel sounds are normal.  Skin:    General: Skin is warm.     Findings: Erythema and rash present.  Neurological:     Mental Status: She is alert and oriented to person, place, and time.    BP 92/62   Pulse 97   Temp 98.1 F (36.7 C) (Temporal)   Ht 5' 7"  (1.702 m)   Wt 253 lb (114.8 kg)   SpO2 97%   BMI 39.63 kg/m  Wt Readings from Last 3 Encounters:  07/30/20 253 lb (114.8 kg)  07/22/20 251 lb (113.9 kg)  06/19/20 251 lb (113.9 kg)    Health Maintenance Due  Topic Date Due   Pneumococcal Vaccine 67-47 Years old (1 - PCV) Never done    There are no preventive care reminders to display for this patient.   Lab Results  Component Value Date   TSH 2.050 03/26/2020   Lab Results  Component Value Date   WBC 7.3 06/19/2020   HGB 14.1 06/19/2020   HCT 43.6 06/19/2020   MCV 80 06/19/2020   PLT 394 06/19/2020   Lab Results  Component Value Date   NA 139 06/19/2020   K 4.5 06/19/2020   CO2 24 06/19/2020   GLUCOSE 98 06/19/2020   BUN 14 06/19/2020   CREATININE 0.89 06/19/2020   BILITOT <0.2 06/19/2020   ALKPHOS 68 06/19/2020   AST 25 06/19/2020   ALT 31 06/19/2020   PROT 6.8 06/19/2020   ALBUMIN 4.5 06/19/2020   CALCIUM 9.8 06/19/2020   ANIONGAP 8 02/05/2020   EGFR 90 06/19/2020   Lab Results  Component Value Date   CHOL 154 06/19/2020   Lab Results  Component Value Date   HDL 66 06/19/2020   Lab Results  Component Value Date   LDLCALC 75 06/19/2020   Lab Results  Component Value Date   TRIG 66 06/19/2020   Lab Results  Component Value Date   CHOLHDL 2.3 06/19/2020   No results found for: HGBA1C     Assessment & Plan:   Problem List Items Addressed This Visit       Other   Herpes zoster without complication - Primary    ForPatient presents with left above eye brow rash erupted in the last 24 to 36 hours.  Rash is painful and goes up into patient's hair.  Patient has had a history of chickenpox.  Pain is  affecting left eye, increased pruritus and discomfort.  Pain is moderate.  Conjunctive are clear  On assessment shingles.  Provided education to patient with printed handouts  given lid 1% hydrocortisone cream topical for itch, ibuprofen/Tylenol for pain, acyclovir antiviral 800 mg tablet by mouth 5 times daily, follow-up in 3 days.  Rx sent to pharmacy.  Follow-up with worsening unresolved symptoms.       Relevant Medications   acyclovir (ZOVIRAX) 800 MG tablet   hydrocortisone 1 % lotion     Meds ordered this encounter  Medications   acyclovir (ZOVIRAX) 800 MG tablet    Sig: Take 1 tablet (800 mg total) by mouth 5 (five) times daily.    Dispense:  36 tablet    Refill:  0    Order Specific Question:   Supervising Provider    Answer:   Janora Norlander [[id number]]   hydrocortisone 1 % lotion    Sig: Apply 1 application topically 2 (two) times daily.    Dispense:  118 mL    Refill:  0    Order Specific Question:   Supervising Provider    Answer:   Janora Norlander [[id number]]     Ivy Lynn, NP

## 2020-08-01 ENCOUNTER — Encounter: Payer: Self-pay | Admitting: Nurse Practitioner

## 2020-08-01 ENCOUNTER — Other Ambulatory Visit: Payer: Self-pay | Admitting: Family Medicine

## 2020-08-01 DIAGNOSIS — B029 Zoster without complications: Secondary | ICD-10-CM

## 2020-08-01 MED ORDER — LIDOCAINE 5 % EX OINT
1.0000 | TOPICAL_OINTMENT | CUTANEOUS | 0 refills | Status: DC | PRN
Start: 2020-08-01 — End: 2021-06-12

## 2020-08-20 ENCOUNTER — Ambulatory Visit: Payer: Medicaid Other | Admitting: Family Medicine

## 2020-08-22 ENCOUNTER — Encounter: Payer: Self-pay | Admitting: Family Medicine

## 2020-08-27 ENCOUNTER — Ambulatory Visit: Payer: Medicaid Other | Admitting: Family Medicine

## 2020-08-27 ENCOUNTER — Encounter: Payer: Self-pay | Admitting: Family Medicine

## 2020-08-27 ENCOUNTER — Other Ambulatory Visit: Payer: Self-pay

## 2020-08-27 VITALS — BP 121/91 | HR 64 | Temp 97.3°F | Ht 67.0 in | Wt 253.6 lb

## 2020-08-27 DIAGNOSIS — F331 Major depressive disorder, recurrent, moderate: Secondary | ICD-10-CM

## 2020-08-27 DIAGNOSIS — F411 Generalized anxiety disorder: Secondary | ICD-10-CM | POA: Diagnosis not present

## 2020-08-27 MED ORDER — CITALOPRAM HYDROBROMIDE 40 MG PO TABS: 40.0000 mg | ORAL_TABLET | Freq: Every day | ORAL | 2 refills | Status: DC

## 2020-08-27 NOTE — Progress Notes (Signed)
Assessment & Plan:  1-2. MDD (major depressive disorder), recurrent episode, moderate (HCC)/Generalized anxiety disorder Improving. Celexa increased from 20 mg to 40 mg once daily.  - citalopram (CELEXA) 40 MG tablet; Take 1 tablet (40 mg total) by mouth daily.  Dispense: 30 tablet; Refill: 2   Return in about 6 weeks (around 10/08/2020) for Mood.  Hendricks Limes, MSN, APRN, FNP-C [facility]  Subjective:    Patient ID: Tiffany [name]. Laurance [name], female    DOB: 1990/11/26, 30 y.o.   MRN: [id number]  Patient Care Team: Tiffany Brooklyn, FNP as PCP - General (Family Medicine) Tiffany Dooms, NP as Nurse Practitioner (Obstetrics and Gynecology)   Chief Complaint:  Chief Complaint  Patient presents with   Anxiety   Depression    2 month follow up. Patient states she "still has her days".    HPI: Tiffany [name]. [name] is a 30 y.o. female presenting on 08/27/2020 for Anxiety and Depression (2 month follow up. Patient states she "still has her days".)  Patient is following up on anxiety and depression. She was started on Celexa 20 mg daily after GeneSight testing. She reports it is working, but she still has her days. Her days have decreased. She has been feeling down for the past week for reasons unknown to her. Nothing has changed or happened. She is interested in a dosage increase.   Depression screen [facility] 2/9 08/27/2020 07/30/2020 06/19/2020  Decreased Interest 1 1 2   Down, Depressed, Hopeless 1 1 3   PHQ - 2 Score 2 2 5   Altered sleeping 1 1 2   Tired, decreased energy 1 1 2   Change in appetite 1 1 0  Feeling bad or failure about yourself  0 1 3  Trouble concentrating 0 0 0  Moving slowly or fidgety/restless 0 0 2  Suicidal thoughts 0 0 0  PHQ-9 Score 5 6 14   Difficult doing work/chores Not difficult at all Somewhat difficult Very difficult  Some recent data might be hidden   GAD 7 : Generalized Anxiety Score 08/27/2020 06/19/2020 11/23/2018 10/12/2018  Nervous, Anxious, on  Edge 1 1 3 1   Control/stop worrying 1 3 3 3   Worry too much - different things 1 3 3 3   Trouble relaxing 1 2 3 1   Restless 1 1 0 1  Easily annoyed or irritable 1 3 1 3   Afraid - awful might happen 1 0 1 0  Total GAD 7 Score 7 13 14 12   Anxiety Difficulty Not difficult at all Very difficult Somewhat difficult Very difficult    New complaints: None  Social history:  Relevant past medical, surgical, family and social history reviewed and updated as indicated. Interim medical history since our last visit reviewed.  Allergies and medications reviewed and updated.  DATA REVIEWED: CHART IN EPIC  ROS: Negative unless specifically indicated above in HPI.    Current Outpatient Medications:    citalopram (CELEXA) 20 MG tablet, Take 1 tablet (20 mg total) by mouth daily., Disp: 30 tablet, Rfl: 2   hydrocortisone 1 % lotion, Apply 1 application topically 2 (two) times daily., Disp: 118 mL, Rfl: 0   levocetirizine (XYZAL) 5 MG tablet, Take 1 tablet (5 mg total) by mouth every evening., Disp: 90 tablet, Rfl: 1   lidocaine (XYLOCAINE) 5 % ointment, Apply 1 application topically as needed., Disp: 35.44 g, Rfl: 0   traZODone (DESYREL) 50 MG tablet, 25-50 mg at night for insomnia, Disp: 90 tablet, Rfl: 3   Allergies  Allergen Reactions   Other Itching and Swelling    Coconut   Past Medical History:  Diagnosis Date   Anxiety    Chronic back pain    Depression    Vaginal Pap smear, abnormal     Past Surgical History:  Procedure Laterality Date   EXTERNAL EAR SURGERY Left    LAPAROSCOPIC BILATERAL SALPINGECTOMY Bilateral 02/21/2020   LAPAROSCOPIC BILATERAL SALPINGECTOMY Bilateral 02/21/2020   Procedure: LAPAROSCOPIC BILATERAL TUBAL LIGATION, BILATERAL SALPINGECTOMY TECHNIQUE;  Surgeon: Tiffany Buff, MD;  Location: [facility];  Service: Gynecology;  Laterality: Bilateral;   LEG SURGERY Left     Social History   Socioeconomic History   Marital status: Single    Spouse name: Not on file    Number of children: 2   Years of education: Not on file   Highest education level: Not on file  Occupational History   Not on file  Tobacco Use   Smoking status: Every Day    Years: 2.00    Types: Cigarettes   Smokeless tobacco: Never  Vaping Use   Vaping Use: Never used  Substance and Sexual Activity   Alcohol use: Yes    Comment: occ   Drug use: Not Currently    Types: Marijuana    Comment: used in the past   Sexual activity: Yes    Birth control/protection: Surgical, Condom  Other Topics Concern   Not on file  Social History Narrative   Not on file   Social Determinants of Health   Financial Resource Strain: Not on file  Food Insecurity: Not on file  Transportation Needs: Not on file  Physical Activity: Not on file  Stress: Not on file  Social Connections: Not on file  Intimate Partner Violence: Not on file        Objective:    BP (!) 121/91   Pulse 64   Temp (!) 97.3 F (36.3 C) (Temporal)   Ht 5' 7"  (1.702 m)   Wt 253 lb 9.6 oz (115 kg)   SpO2 98%   BMI 39.72 kg/m   Wt Readings from Last 3 Encounters:  08/27/20 253 lb 9.6 oz (115 kg)  07/30/20 253 lb (114.8 kg)  07/22/20 251 lb (113.9 kg)    Physical Exam Vitals reviewed.  Constitutional:      General: She is not in acute distress.    Appearance: Normal appearance. She is not ill-appearing, toxic-appearing or diaphoretic.  HENT:     Head: Normocephalic and atraumatic.  Eyes:     General: No scleral icterus.       Right eye: No discharge.        Left eye: No discharge.     Conjunctiva/sclera: Conjunctivae normal.  Cardiovascular:     Rate and Rhythm: Normal rate.  Pulmonary:     Effort: Pulmonary effort is normal. No respiratory distress.  Musculoskeletal:        General: Normal range of motion.     Cervical back: Normal range of motion.  Skin:    General: Skin is warm and dry.     Capillary Refill: Capillary refill takes less than 2 seconds.  Neurological:     General: No focal  deficit present.     Mental Status: She is alert and oriented to person, place, and time. Mental status is at baseline.  Psychiatric:        Mood and Affect: Mood normal.        Behavior: Behavior normal.  Thought Content: Thought content normal.        Judgment: Judgment normal.    Lab Results  Component Value Date   TSH 2.050 03/26/2020   Lab Results  Component Value Date   WBC 7.3 06/19/2020   HGB 14.1 06/19/2020   HCT 43.6 06/19/2020   MCV 80 06/19/2020   PLT 394 06/19/2020   Lab Results  Component Value Date   NA 139 06/19/2020   K 4.5 06/19/2020   CO2 24 06/19/2020   GLUCOSE 98 06/19/2020   BUN 14 06/19/2020   CREATININE 0.89 06/19/2020   BILITOT <0.2 06/19/2020   ALKPHOS 68 06/19/2020   AST 25 06/19/2020   ALT 31 06/19/2020   PROT 6.8 06/19/2020   ALBUMIN 4.5 06/19/2020   CALCIUM 9.8 06/19/2020   ANIONGAP 8 02/05/2020   EGFR 90 06/19/2020   Lab Results  Component Value Date   CHOL 154 06/19/2020   Lab Results  Component Value Date   HDL 66 06/19/2020   Lab Results  Component Value Date   LDLCALC 75 06/19/2020   Lab Results  Component Value Date   TRIG 66 06/19/2020   Lab Results  Component Value Date   CHOLHDL 2.3 06/19/2020   No results found for: HGBA1C

## 2020-10-08 ENCOUNTER — Other Ambulatory Visit: Payer: Self-pay

## 2020-10-08 ENCOUNTER — Ambulatory Visit: Payer: Medicaid Other | Admitting: Family Medicine

## 2020-11-01 ENCOUNTER — Encounter: Payer: Self-pay | Admitting: Family Medicine

## 2020-11-01 ENCOUNTER — Other Ambulatory Visit: Payer: Self-pay

## 2020-11-01 ENCOUNTER — Ambulatory Visit: Payer: Medicaid Other | Admitting: Family Medicine

## 2020-11-01 DIAGNOSIS — F331 Major depressive disorder, recurrent, moderate: Secondary | ICD-10-CM | POA: Diagnosis not present

## 2020-11-01 DIAGNOSIS — F411 Generalized anxiety disorder: Secondary | ICD-10-CM | POA: Diagnosis not present

## 2020-11-01 MED ORDER — CITALOPRAM HYDROBROMIDE 40 MG PO TABS: 40.0000 mg | ORAL_TABLET | Freq: Every day | ORAL | 2 refills | Status: DC

## 2020-11-01 NOTE — Progress Notes (Signed)
Assessment & Plan:  1-2. MDD (major depressive disorder), recurrent episode, moderate (HCC)/Generalized anxiety disorder Uncontrolled. Encouraged patient to resume Celexa daily and take consistently. Education provided on managing depression in adults.  - citalopram (CELEXA) 40 MG tablet; Take 1 tablet (40 mg total) by mouth daily.  Dispense: 30 tablet; Refill: 2   Return in about 3 months (around 02/01/2021) for depression.  Hendricks Limes, MSN, APRN, FNP-C [facility]  Subjective:    Patient ID: Tiffany [name]. Tiffany [name], female    DOB: 1990/10/03, 30 y.o.   MRN: [id number]  Patient Care Team: Loman Brooklyn, FNP as PCP - General (Family Medicine) Estill Dooms, NP as Nurse Practitioner (Obstetrics and Gynecology)   Chief Complaint:  Chief Complaint  Patient presents with   mood    6 week re check- Patient states she still has on and off days    HPI: Tiffany [name]. Potts is a 30 y.o. female presenting on 11/01/2020 for mood (6 week re check- Patient states she still has on and off days)  Patient is following up on anxiety and depression. She was started on Celexa 20 mg daily after GeneSight testing, which was then increased to 40 mg daily. Patient reports today she has good and bad days. She had to quit her job a couple of weeks ago. She reports she has been feeling very down and sleeping a lot. She does not want to be bothered. She currently lives in a 2 bedroom apartment with her 70 and 30 year old boys; she is in the process of moving herself to the living room so that they can each have their own room. She is not taking the Celexa every day. States when she was, she felt great so she quit taking it. She acknowledges that she does much better on the medication, but does not want to be dependent on medications.   Depression screen [facility] 2/9 11/01/2020 08/27/2020 07/30/2020  Decreased Interest _0 Down, Depressed, Hopeless _1 PHQ - 2 Score _2 Altered sleeping _3 Tired, decreased energy _4 Change in appetite _5 Feeling bad or failure about yourself  2 0 1  Trouble concentrating 1 0 0  Moving slowly or fidgety/restless 0 0 0  Suicidal thoughts 0 0 0  PHQ-9 Score _6 Difficult doing work/chores Somewhat difficult Not difficult at all Somewhat difficult  Some recent data might be hidden   GAD 7 : Generalized Anxiety Score 11/01/2020 08/27/2020 06/19/2020 11/23/2018  Nervous, Anxious, on Edge _7 Control/stop worrying _8 Worry too much - different things _9 Trouble relaxing _10 Restless _11 0  Easily annoyed or irritable _12 Afraid - awful might happen 1 1 0 1  Total GAD 7 Score _13 Anxiety Difficulty Somewhat difficult Not difficult at all Very difficult Somewhat difficult    New complaints: None  Social history:  Relevant past medical, surgical, family and social history reviewed and updated as indicated. Interim medical history since our last visit reviewed.  Allergies and medications reviewed and updated.  DATA REVIEWED: CHART IN EPIC  ROS: Negative unless specifically indicated above in HPI.    Current Outpatient Medications:    citalopram (CELEXA) 40 MG tablet, Take 1 tablet (40 mg total) by mouth  daily., Disp: 30 tablet, Rfl: 2   hydrocortisone 1 % lotion, Apply 1 application topically 2 (two) times daily., Disp: 118 mL, Rfl: 0   levocetirizine (XYZAL) 5 MG tablet, Take 1 tablet (5 mg total) by mouth every evening., Disp: 90 tablet, Rfl: 1   lidocaine (XYLOCAINE) 5 % ointment, Apply 1 application topically as needed., Disp: 35.44 g, Rfl: 0   traZODone (DESYREL) 50 MG tablet, 25-50 mg at night for insomnia, Disp: 90 tablet, Rfl: 3   Allergies  Allergen Reactions   Other Itching and Swelling    Coconut   Past Medical History:  Diagnosis Date   Anxiety    Chronic back pain    Depression    Vaginal Pap smear, abnormal     Past Surgical History:  Procedure Laterality Date    EXTERNAL EAR SURGERY Left    LAPAROSCOPIC BILATERAL SALPINGECTOMY Bilateral 02/21/2020   LAPAROSCOPIC BILATERAL SALPINGECTOMY Bilateral 02/21/2020   Procedure: LAPAROSCOPIC BILATERAL TUBAL LIGATION, BILATERAL SALPINGECTOMY TECHNIQUE;  Surgeon: Florian Buff, MD;  Location: [facility];  Service: Gynecology;  Laterality: Bilateral;   LEG SURGERY Left     Social History   Socioeconomic History   Marital status: Single    Spouse name: Not on file   Number of children: 2   Years of education: Not on file   Highest education level: Not on file  Occupational History   Not on file  Tobacco Use   Smoking status: Every Day    Years: 2.00    Types: Cigarettes   Smokeless tobacco: Never  Vaping Use   Vaping Use: Never used  Substance and Sexual Activity   Alcohol use: Yes    Comment: occ   Drug use: Not Currently    Types: Marijuana    Comment: used in the past   Sexual activity: Yes    Birth control/protection: Surgical, Condom  Other Topics Concern   Not on file  Social History Narrative   Not on file   Social Determinants of Health   Financial Resource Strain: Not on file  Food Insecurity: Not on file  Transportation Needs: Not on file  Physical Activity: Not on file  Stress: Not on file  Social Connections: Not on file  Intimate Partner Violence: Not on file        Objective:    BP 102/69   Pulse 68   Temp 97.8 F (36.6 C) (Temporal)   Ht 5' 7" (1.702 m)   Wt 258 lb 3.2 oz (117.1 kg)   SpO2 98%   BMI 40.44 kg/m   Wt Readings from Last 3 Encounters:  11/01/20 258 lb 3.2 oz (117.1 kg)  08/27/20 253 lb 9.6 oz (115 kg)  07/30/20 253 lb (114.8 kg)    Physical Exam Vitals reviewed.  Constitutional:      General: She is not in acute distress.    Appearance: Normal appearance. She is morbidly obese. She is not ill-appearing, toxic-appearing or diaphoretic.  HENT:     Head: Normocephalic and atraumatic.  Eyes:     General: No scleral icterus.       Right eye: No  discharge.        Left eye: No discharge.     Conjunctiva/sclera: Conjunctivae normal.  Cardiovascular:     Rate and Rhythm: Normal rate.  Pulmonary:     Effort: Pulmonary effort is normal. No respiratory distress.  Musculoskeletal:        General: Normal range of motion.  Cervical back: Normal range of motion.  Skin:    General: Skin is warm and dry.     Capillary Refill: Capillary refill takes less than 2 seconds.  Neurological:     General: No focal deficit present.     Mental Status: She is alert and oriented to person, place, and time. Mental status is at baseline.  Psychiatric:        Mood and Affect: Mood normal.        Behavior: Behavior normal.        Thought Content: Thought content normal.        Judgment: Judgment normal.    Lab Results  Component Value Date   TSH 2.050 03/26/2020   Lab Results  Component Value Date   WBC 7.3 06/19/2020   HGB 14.1 06/19/2020   HCT 43.6 06/19/2020   MCV 80 06/19/2020   PLT 394 06/19/2020   Lab Results  Component Value Date   NA 139 06/19/2020   K 4.5 06/19/2020   CO2 24 06/19/2020   GLUCOSE 98 06/19/2020   BUN 14 06/19/2020   CREATININE 0.89 06/19/2020   BILITOT <0.2 06/19/2020   ALKPHOS 68 06/19/2020   AST 25 06/19/2020   ALT 31 06/19/2020   PROT 6.8 06/19/2020   ALBUMIN 4.5 06/19/2020   CALCIUM 9.8 06/19/2020   ANIONGAP 8 02/05/2020   EGFR 90 06/19/2020   Lab Results  Component Value Date   CHOL 154 06/19/2020   Lab Results  Component Value Date   HDL 66 06/19/2020   Lab Results  Component Value Date   LDLCALC 75 06/19/2020   Lab Results  Component Value Date   TRIG 66 06/19/2020   Lab Results  Component Value Date   CHOLHDL 2.3 06/19/2020   No results found for: HGBA1C

## 2021-01-06 ENCOUNTER — Ambulatory Visit (INDEPENDENT_AMBULATORY_CARE_PROVIDER_SITE_OTHER): Payer: Medicaid Other | Admitting: *Deleted

## 2021-01-06 DIAGNOSIS — Z111 Encounter for screening for respiratory tuberculosis: Secondary | ICD-10-CM | POA: Diagnosis not present

## 2021-01-06 NOTE — Progress Notes (Signed)
Ppd skin test place on right forearm. Patient given an appointment for Wednesday @ 4:00pm for read.

## 2021-01-08 ENCOUNTER — Ambulatory Visit (INDEPENDENT_AMBULATORY_CARE_PROVIDER_SITE_OTHER): Payer: Medicaid Other | Admitting: *Deleted

## 2021-01-08 DIAGNOSIS — Z111 Encounter for screening for respiratory tuberculosis: Secondary | ICD-10-CM

## 2021-01-08 LAB — TB SKIN TEST: TB Skin Test: NEGATIVE

## 2021-02-04 ENCOUNTER — Ambulatory Visit (INDEPENDENT_AMBULATORY_CARE_PROVIDER_SITE_OTHER): Payer: Medicaid Other | Admitting: Family Medicine

## 2021-02-04 DIAGNOSIS — F331 Major depressive disorder, recurrent, moderate: Secondary | ICD-10-CM

## 2021-02-04 NOTE — Progress Notes (Signed)
Virtual Visit via Telephone Note  I connected with Tiffany [name] on 02/04/21 at 3:48 PM by telephone and verified that I am speaking with the correct person using two identifiers. Tiffany [name] is currently located at home and her friend is currently with her during this visit. The provider, Loman Brooklyn, FNP is located in their home at time of visit.  I discussed the limitations, risks, security and privacy concerns of performing an evaluation and management service by telephone and the availability of in person appointments. I also discussed with the patient that there may be a patient responsible charge related to this service. The patient expressed understanding and agreed to proceed.  Subjective: PCP: Loman Brooklyn, FNP  Chief Complaint  Patient presents with   Depression   Patient is following up on depression. She is currently only taking her Celexa about three times per week. States she feels good when she takes it but she can tell when it wears off and then she is depressed again. She feels like she is still getting aggravated easily and lashing out. She does smoke marijuana on days she does not take her Celexa to help cope and feels this may be related to why she did not recently get a job she interviewed for.    ROS: Per HPI  Current Outpatient Medications:    citalopram (CELEXA) 40 MG tablet, Take 1 tablet (40 mg total) by mouth daily., Disp: 30 tablet, Rfl: 2   hydrocortisone 1 % lotion, Apply 1 application topically 2 (two) times daily., Disp: 118 mL, Rfl: 0   levocetirizine (XYZAL) 5 MG tablet, Take 1 tablet (5 mg total) by mouth every evening., Disp: 90 tablet, Rfl: 1   lidocaine (XYLOCAINE) 5 % ointment, Apply 1 application topically as needed., Disp: 35.44 g, Rfl: 0   traZODone (DESYREL) 50 MG tablet, 25-50 mg at night for insomnia, Disp: 90 tablet, Rfl: 3  Allergies  Allergen Reactions   Other Itching and Swelling    Coconut   Past Medical History:   Diagnosis Date   Anxiety    Chronic back pain    Depression    Vaginal Pap smear, abnormal     Observations/Objective: A&O  No respiratory distress or wheezing audible over the phone Mood, judgement, and thought processes all WNL   Assessment and Plan: 1. MDD (major depressive disorder), recurrent episode, moderate (HCC) Uncontrolled. Patient has completed GeneSight testing in the past; discussed switching medication to something else but she declined. Encouraged patient to take Celexa regularly to get best effect from the medication.    Follow Up Instructions: Return in about 6 months (around 08/04/2021) for annual physical.  I discussed the assessment and treatment plan with the patient. The patient was provided an opportunity to ask questions and all were answered. The patient agreed with the plan and demonstrated an understanding of the instructions.   The patient was advised to call back or seek an in-person evaluation if the symptoms worsen or if the condition fails to improve as anticipated.  The above assessment and management plan was discussed with the patient. The patient verbalized understanding of and has agreed to the management plan. Patient is aware to call the clinic if symptoms persist or worsen. Patient is aware when to return to the clinic for a follow-up visit. Patient educated on when it is appropriate to go to the emergency department.   Time call ended: 4:05 PM  I provided 17 minutes of non-face-to-face time  during this encounter.  Hendricks Limes, MSN, APRN, FNP-C [facility] 02/04/21

## 2021-02-05 ENCOUNTER — Encounter: Payer: Self-pay | Admitting: Family Medicine

## 2021-04-28 ENCOUNTER — Emergency Department (HOSPITAL_COMMUNITY)
Admission: EM | Admit: 2021-04-28 | Discharge: 2021-04-29 | Disposition: A | Discharge status: Home / Self Care | Payer: Medicaid Other | Attending: Emergency Medicine | Admitting: Emergency Medicine

## 2021-04-28 ENCOUNTER — Other Ambulatory Visit: Payer: Self-pay

## 2021-04-28 DIAGNOSIS — D72829 Elevated white blood cell count, unspecified: Secondary | ICD-10-CM | POA: Diagnosis not present

## 2021-04-28 DIAGNOSIS — R1033 Periumbilical pain: Secondary | ICD-10-CM | POA: Diagnosis not present

## 2021-04-28 DIAGNOSIS — Z91040 Latex allergy status: Secondary | ICD-10-CM | POA: Diagnosis not present

## 2021-04-28 LAB — URINALYSIS, ROUTINE W REFLEX MICROSCOPIC
Bilirubin Urine: NEGATIVE
Glucose, UA: NEGATIVE mg/dL
Hgb urine dipstick: NEGATIVE
Ketones, ur: NEGATIVE mg/dL
Nitrite: NEGATIVE
Protein, ur: NEGATIVE mg/dL
Specific Gravity, Urine: 1.027 (ref 1.005–1.030)
pH: 5 (ref 5.0–8.0)

## 2021-04-28 NOTE — ED Triage Notes (Signed)
Patient states lower abdominal pain with burning sensation when moving. Denies any other symptoms. ?

## 2021-04-29 MED ORDER — MELOXICAM 15 MG PO TABS
15.0000 mg | ORAL_TABLET | Freq: Once | ORAL | Status: AC
  Administered 2021-04-29: 15 mg via ORAL
  Filled 2021-04-29: qty 1

## 2021-04-29 MED ORDER — MELOXICAM 7.5 MG PO TABS
ORAL_TABLET | ORAL | Status: AC
  Filled 2021-04-29: qty 2

## 2021-04-29 MED ORDER — MELOXICAM 15 MG PO TABS: 15.0000 mg | ORAL_TABLET | Freq: Every day | ORAL | 0 refills | Status: DC

## 2021-04-29 NOTE — ED Notes (Signed)
Ambulatory to  hall. States she is ready to go. Rec'd d/c papers and medication. Unable to get new v/s.  ?

## 2021-04-29 NOTE — ED Provider Notes (Signed)
?[facility] ?Provider Note ? ? ?CSN: [id number] ?Arrival date & time: 04/28/21  2103 ? ?  ? ?History ? ?Chief Complaint  ?Patient presents with  ? Abdominal Pain  ? ? ?Tiffany [name] is a 31 y.o. female. ? ?Burning in periumbilical area feels like a sore muscle. Has been present off and on for the lat couple months. Seems to worsen with sitting up after laying down. Also worse with lifting things and certain other movements. No n/v/d. No urinary symptoms. No vaginal symptoms. Hasn't tried anything or seen anyone for symptoms. Did note she had her tubes removed via a periumbilical incision about a year ago.  ? ? ?Abdominal Pain ? ?  ? ?Home Medications ?Prior to Admission medications   ?Medication Sig Start Date End Date Taking? Authorizing Provider  ?meloxicam (MOBIC) 15 MG tablet Take 1 tablet (15 mg total) by mouth daily. 04/29/21  Yes Janiece Scovill, Corene Cornea, MD  ?citalopram (CELEXA) 40 MG tablet Take 1 tablet (40 mg total) by mouth daily. 11/01/20   Loman Brooklyn, FNP  ?hydrocortisone 1 % lotion Apply 1 application topically 2 (two) times daily. 07/30/20   Ivy Lynn, NP  ?levocetirizine (XYZAL) 5 MG tablet Take 1 tablet (5 mg total) by mouth every evening. 06/19/20   Loman Brooklyn, FNP  ?lidocaine (XYLOCAINE) 5 % ointment Apply 1 application topically as needed. 08/01/20   Gwenlyn Perking, FNP  ?traZODone (DESYREL) 50 MG tablet 25-50 mg at night for insomnia 06/19/20   Loman Brooklyn, FNP  ?   ? ?Allergies    ?Latex and Other   ? ?Review of Systems   ?Review of Systems  ?Gastrointestinal:  Positive for abdominal pain.  ? ?Physical Exam ?Updated Vital Signs ?BP (!) 147/97   Pulse 93   Temp 98.3 ?F (36.8 ?C) (Oral)   Resp 18   Ht '5\' 7"'$  (1.702 m)   Wt 117 kg   SpO2 100%   BMI 40.40 kg/m?  ?Physical Exam ?Vitals and nursing note reviewed.  ?Constitutional:   ?   Appearance: She is well-developed.  ?HENT:  ?   Head: Normocephalic and atraumatic.  ?   Mouth/Throat:  ?   Pharynx: Oropharynx is  clear.  ?Eyes:  ?   Pupils: Pupils are equal, round, and reactive to light.  ?Cardiovascular:  ?   Rate and Rhythm: Normal rate and regular rhythm.  ?Pulmonary:  ?   Effort: Pulmonary effort is normal. No respiratory distress.  ?   Breath sounds: No stridor.  ?Abdominal:  ?   General: Abdomen is flat. There is no distension.  ?Musculoskeletal:  ?   Cervical back: Normal range of motion.  ?Skin: ?   General: Skin is warm and dry.  ?Neurological:  ?   General: No focal deficit present.  ?   Mental Status: She is alert.  ? ? ?ED Results / Procedures / Treatments   ?Labs ?(all labs ordered are listed, but only abnormal results are displayed) ?Labs Reviewed  ?URINALYSIS, ROUTINE W REFLEX MICROSCOPIC - Abnormal; Notable for the following components:  ?    Result Value  ? APPearance HAZY (*)   ? Leukocytes,Ua TRACE (*)   ? Bacteria, UA FEW (*)   ? All other components within normal limits  ? ? ?EKG ?None ? ?Radiology ?No results found. ? ?Procedures ?Procedures  ? ? ?Medications Ordered in ED ?Medications  ?meloxicam (MOBIC) tablet 15 mg (15 mg Oral Given 04/29/21 0101)  ? ? ?  ED Course/ Medical Decision Making/ A&P ?  ?                        ?Medical Decision Making ?Risk ?Prescription drug management. ? ? ?Benign abdomen. Possibly msk vs scar tissue? No other symptoms or acuity to suggest need for imaging or further workup. Will try NSAIDs and PCP follow up if not improving.  ? ?Final Clinical Impression(s) / ED Diagnoses ?Final diagnoses:  ?Periumbilical abdominal pain  ? ? ?Rx / DC Orders ?ED Discharge Orders   ? ?      Ordered  ?  meloxicam (MOBIC) 15 MG tablet  Daily       ? 04/29/21 0015  ? ?  ?  ? ?  ? ? ?  ?Merrily Pew, MD ?04/29/21 [phone] ? ?

## 2021-04-30 ENCOUNTER — Telehealth: Payer: Self-pay

## 2021-04-30 NOTE — Telephone Encounter (Signed)
Transition Care Management Follow-up Telephone Call ?Date of discharge and from where: 04/29/2021 from [facility] ?How have you been since you were released from the hospital? Patient stated that she is still in some pain but feeling some better.  ?Any questions or concerns? No ? ?Items Reviewed: ?Did the pt receive and understand the discharge instructions provided? Yes  ?Medications obtained and verified? Yes  ?Other? No  ?Any new allergies since your discharge? No  ?Dietary orders reviewed? No ?Do you have support at home? Yes  ? ?Functional Questionnaire: (I = Independent and D = Dependent) ?ADLs: I ? ?Bathing/Dressing- I ? ?Meal Prep- I ? ?Eating- I ? ?Maintaining continence- I ? ?Transferring/Ambulation- I ? ?Managing Meds- I ? ? ?Follow up appointments reviewed: ? ?PCP Hospital f/u appt confirmed? No  Patient encouraged to reach out to PCP if sx do not improve.  ?[facility] f/u appt confirmed? No   ?Are transportation arrangements needed? No  ?If their condition worsens, is the pt aware to call PCP or go to the Emergency Dept.? Yes ?Was the patient provided with contact information for the PCP's office or ED? Yes ?Was to pt encouraged to call back with questions or concerns? Yes ? ?

## 2021-04-30 NOTE — Telephone Encounter (Signed)
error 

## 2021-06-09 ENCOUNTER — Telehealth: Payer: Self-pay | Admitting: Family Medicine

## 2021-06-09 NOTE — Telephone Encounter (Signed)
For pain with menstrual cycle, Ibuprofen 400-600 mg tablet every 8 hours as needed.  for itching, OTC Vagisil.  ?

## 2021-06-09 NOTE — Telephone Encounter (Signed)
Pt has appt to see PCP on 5/18 because she keeps having pain/burning everytime she has her menstrual cycle. ? ?Wants advise on what she can do to help with that until she comes in for her appt.  ? ?Please message pt via MyChart per pt request. ?

## 2021-06-12 ENCOUNTER — Ambulatory Visit (INDEPENDENT_AMBULATORY_CARE_PROVIDER_SITE_OTHER): Payer: Medicaid Other | Admitting: Family Medicine

## 2021-06-12 ENCOUNTER — Encounter: Payer: Self-pay | Admitting: Family Medicine

## 2021-06-12 VITALS — BP 119/83 | HR 74 | Temp 98.0°F | Ht 67.0 in | Wt 270.6 lb

## 2021-06-12 DIAGNOSIS — R1033 Periumbilical pain: Secondary | ICD-10-CM

## 2021-06-12 MED ORDER — MELOXICAM 15 MG PO TABS
15.0000 mg | ORAL_TABLET | Freq: Every day | ORAL | 0 refills | Status: DC
Start: 2021-06-12 — End: 2023-01-06

## 2021-06-12 NOTE — Progress Notes (Signed)
Assessment & Plan:  1. Periumbilical pain - US Abdomen Limited; Future - meloxicam (MOBIC) 15 MG tablet; Take 1 tablet (15 mg total) by mouth daily.  Dispense: 30 tablet; Refill: 0   Follow up plan: Return if symptoms worsen or fail to improve.  Hendricks Limes, MSN, APRN, FNP-C [facility]  Subjective:   Patient ID: Tiffany [name]. [name], female    DOB: 06/25/1990, 31 y.o.   MRN: [id number]  HPI: [name]. Hogland is a 31 y.o. female presenting on 06/12/2021 for Abdominal Pain (Patient states in the last few months whenever she is on her cycle she has burning and pain on her right side of abdomen. Patient states that the burning sensation will also happen when she is not on her cycle. )  Patient reports a burning/pulling pain around and in her bellybutton.  This has been occurring for a few months now.  She reports the pain is worse when she is on her period, but occurs when she is off of it.  The pain can last for days.  She has not found anything that makes it worse or come on.  Holding pressure against her abdomen with a pillow makes it better.  Tylenol and ibuprofen have not been helpful. She was seen in the ER last month where she was told her abdominal pain was due to scar tissue from her laparoscopic salpingectomy.  She does not feel this is the case.   ROS: Negative unless specifically indicated above in HPI.   Relevant past medical history reviewed and updated as indicated.   Allergies and medications reviewed and updated.   Current Outpatient Medications:    citalopram (CELEXA) 40 MG tablet, Take 1 tablet (40 mg total) by mouth daily., Disp: 30 tablet, Rfl: 2   traZODone (DESYREL) 50 MG tablet, 25-50 mg at night for insomnia, Disp: 90 tablet, Rfl: 3   levocetirizine (XYZAL) 5 MG tablet, Take 1 tablet (5 mg total) by mouth every evening. (Patient not taking: Reported on 06/12/2021), Disp: 90 tablet, Rfl: 1   meloxicam (MOBIC) 15 MG tablet, Take 1 tablet (15 mg  total) by mouth daily. (Patient not taking: Reported on 06/12/2021), Disp: 14 tablet, Rfl: 0  Allergies  Allergen Reactions   Latex Rash   Other Itching and Swelling    Coconut    Objective:   BP 119/83   Pulse 74   Temp 98 F (36.7 C) (Temporal)   Ht '5\' 7"'$  (1.702 m)   Wt 270 lb 9.6 oz (122.7 kg)   SpO2 96%   BMI 42.38 kg/m    Physical Exam Vitals reviewed.  Constitutional:      General: She is not in acute distress.    Appearance: Normal appearance. She is not ill-appearing, toxic-appearing or diaphoretic.  HENT:     Head: Normocephalic and atraumatic.  Eyes:     General: No scleral icterus.       Right eye: No discharge.        Left eye: No discharge.     Conjunctiva/sclera: Conjunctivae normal.  Cardiovascular:     Rate and Rhythm: Normal rate.  Pulmonary:     Effort: Pulmonary effort is normal. No respiratory distress.  Abdominal:     General: Bowel sounds are normal. There is no distension or abdominal bruit. There are no signs of injury.     Palpations: Abdomen is soft. There is no shifting dullness, fluid wave, hepatomegaly, splenomegaly, mass or pulsatile mass.  Tenderness: There is abdominal tenderness in the periumbilical area.  Musculoskeletal:        General: Normal range of motion.     Cervical back: Normal range of motion.  Skin:    General: Skin is warm and dry.     Capillary Refill: Capillary refill takes less than 2 seconds.  Neurological:     General: No focal deficit present.     Mental Status: She is alert and oriented to person, place, and time. Mental status is at baseline.  Psychiatric:        Mood and Affect: Mood normal.        Behavior: Behavior normal.        Thought Content: Thought content normal.        Judgment: Judgment normal.

## 2021-06-15 ENCOUNTER — Encounter: Payer: Self-pay | Admitting: Family Medicine

## 2021-07-01 ENCOUNTER — Ambulatory Visit (HOSPITAL_COMMUNITY): Payer: Medicaid Other

## 2021-07-09 ENCOUNTER — Ambulatory Visit (HOSPITAL_COMMUNITY)
Admission: RE | Admit: 2021-07-09 | Discharge: 2021-07-09 | Disposition: A | Discharge status: Home / Self Care | Payer: Medicaid Other | Source: Ambulatory Visit | Attending: Family Medicine | Admitting: Family Medicine

## 2021-07-09 DIAGNOSIS — R109 Unspecified abdominal pain: Secondary | ICD-10-CM | POA: Diagnosis not present

## 2021-07-09 DIAGNOSIS — R1033 Periumbilical pain: Secondary | ICD-10-CM | POA: Diagnosis not present

## 2021-08-06 ENCOUNTER — Encounter: Payer: Self-pay | Admitting: Family Medicine

## 2021-08-06 ENCOUNTER — Encounter: Payer: Medicaid Other | Admitting: Family Medicine

## 2021-08-08 NOTE — Progress Notes (Signed)
Patient left before being seen.

## 2021-10-13 IMAGING — DX DG FOOT COMPLETE 3+V*L*
3 series · 3 of 3 positions shown · non-contrast
Comparison: None.

CLINICAL DATA: Chronic foot pain with swelling

EXAM:
LEFT FOOT - COMPLETE 3+ VIEW

[foot ap]
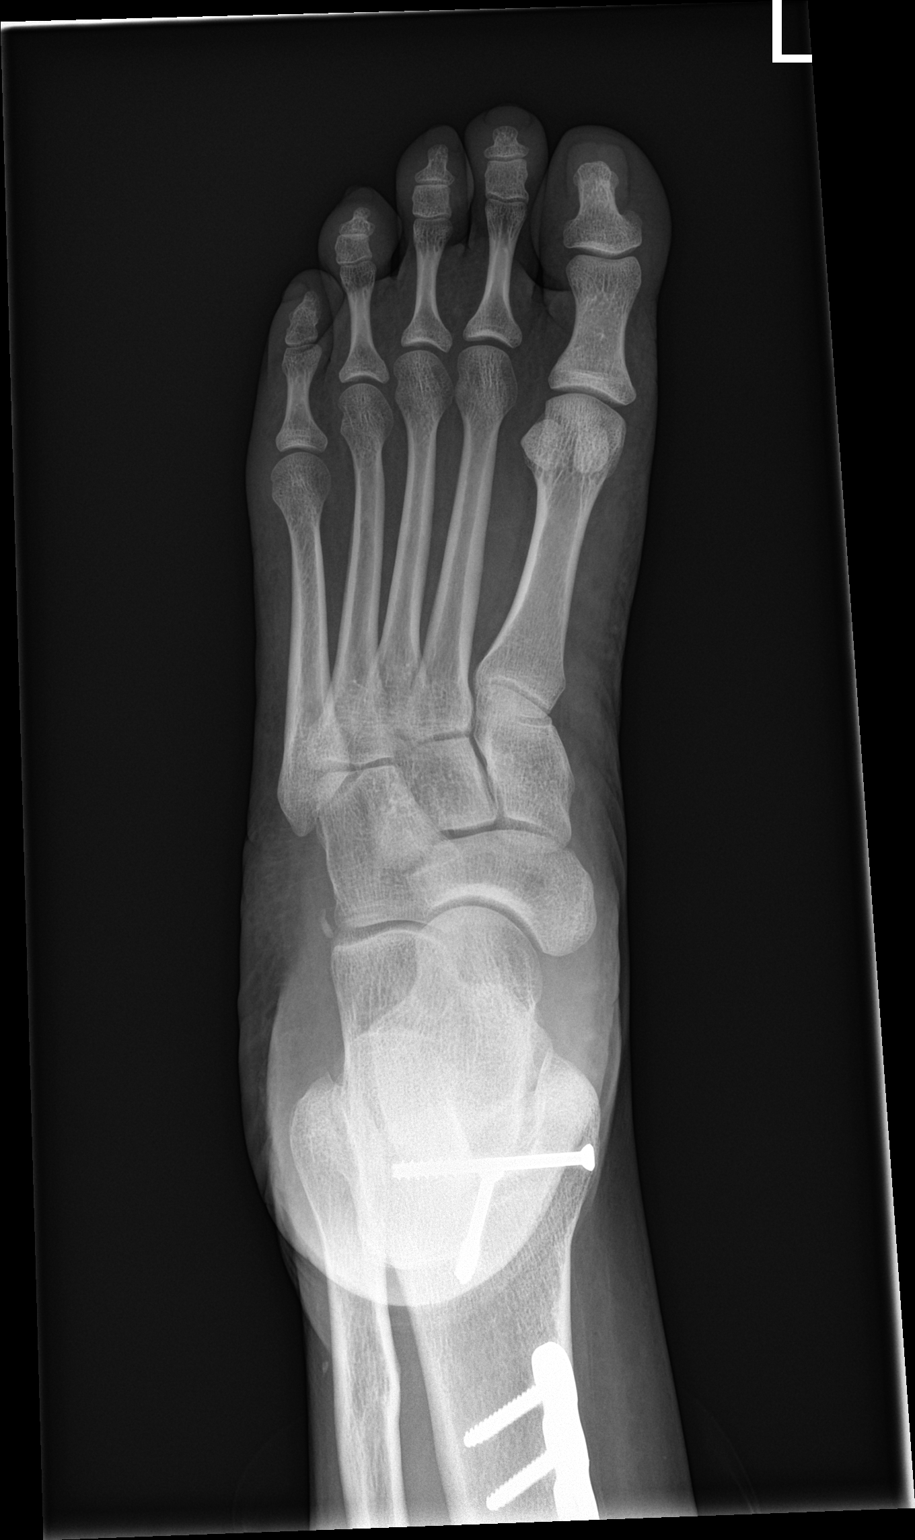

[foot obl]
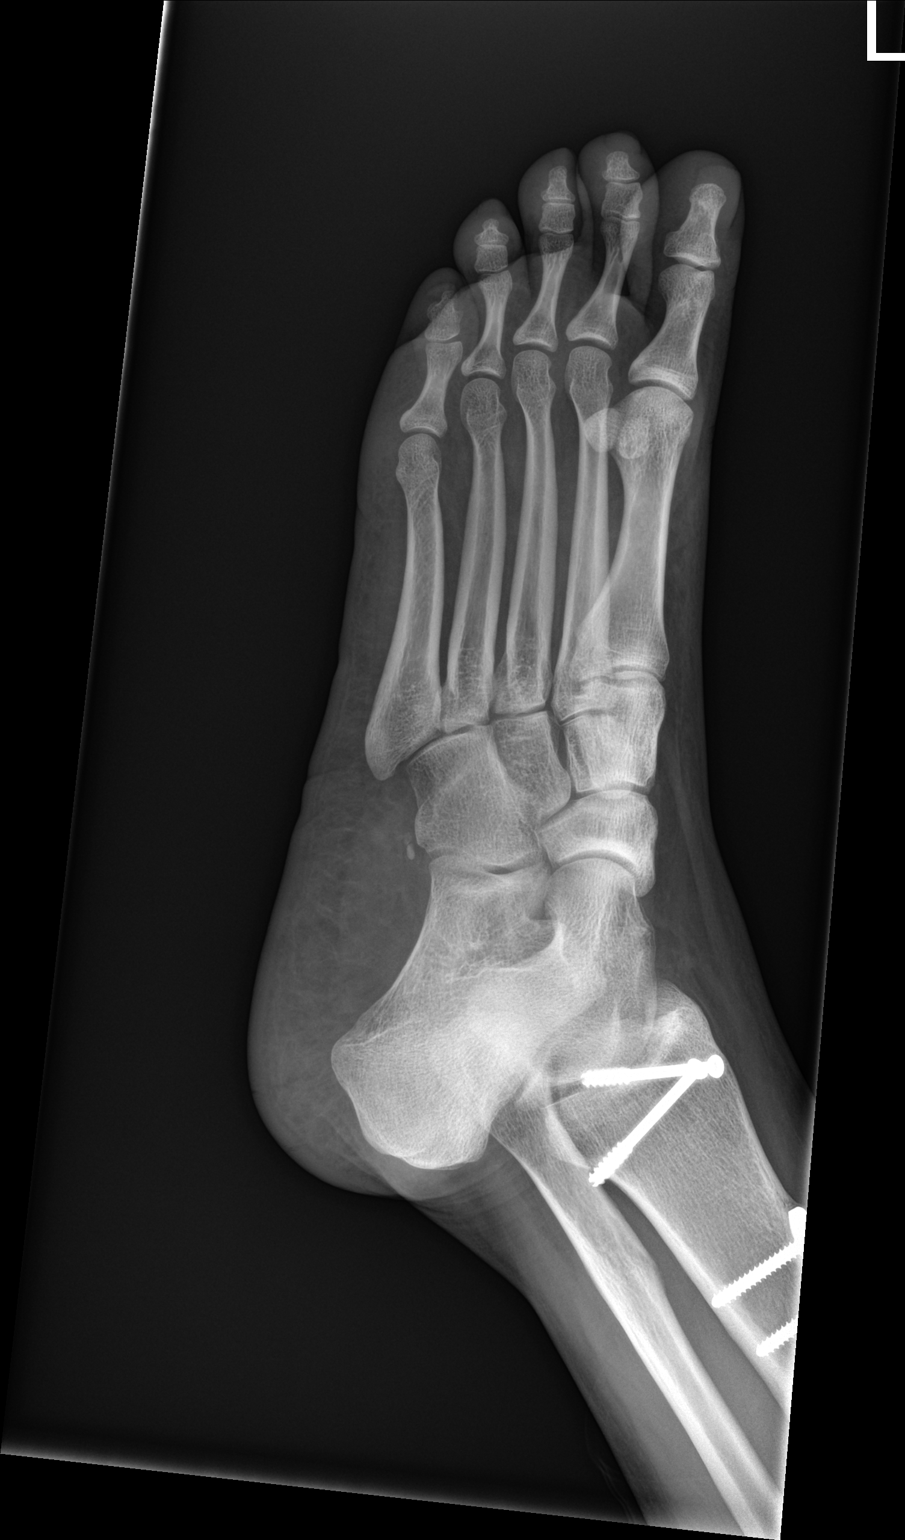

[foot lat]
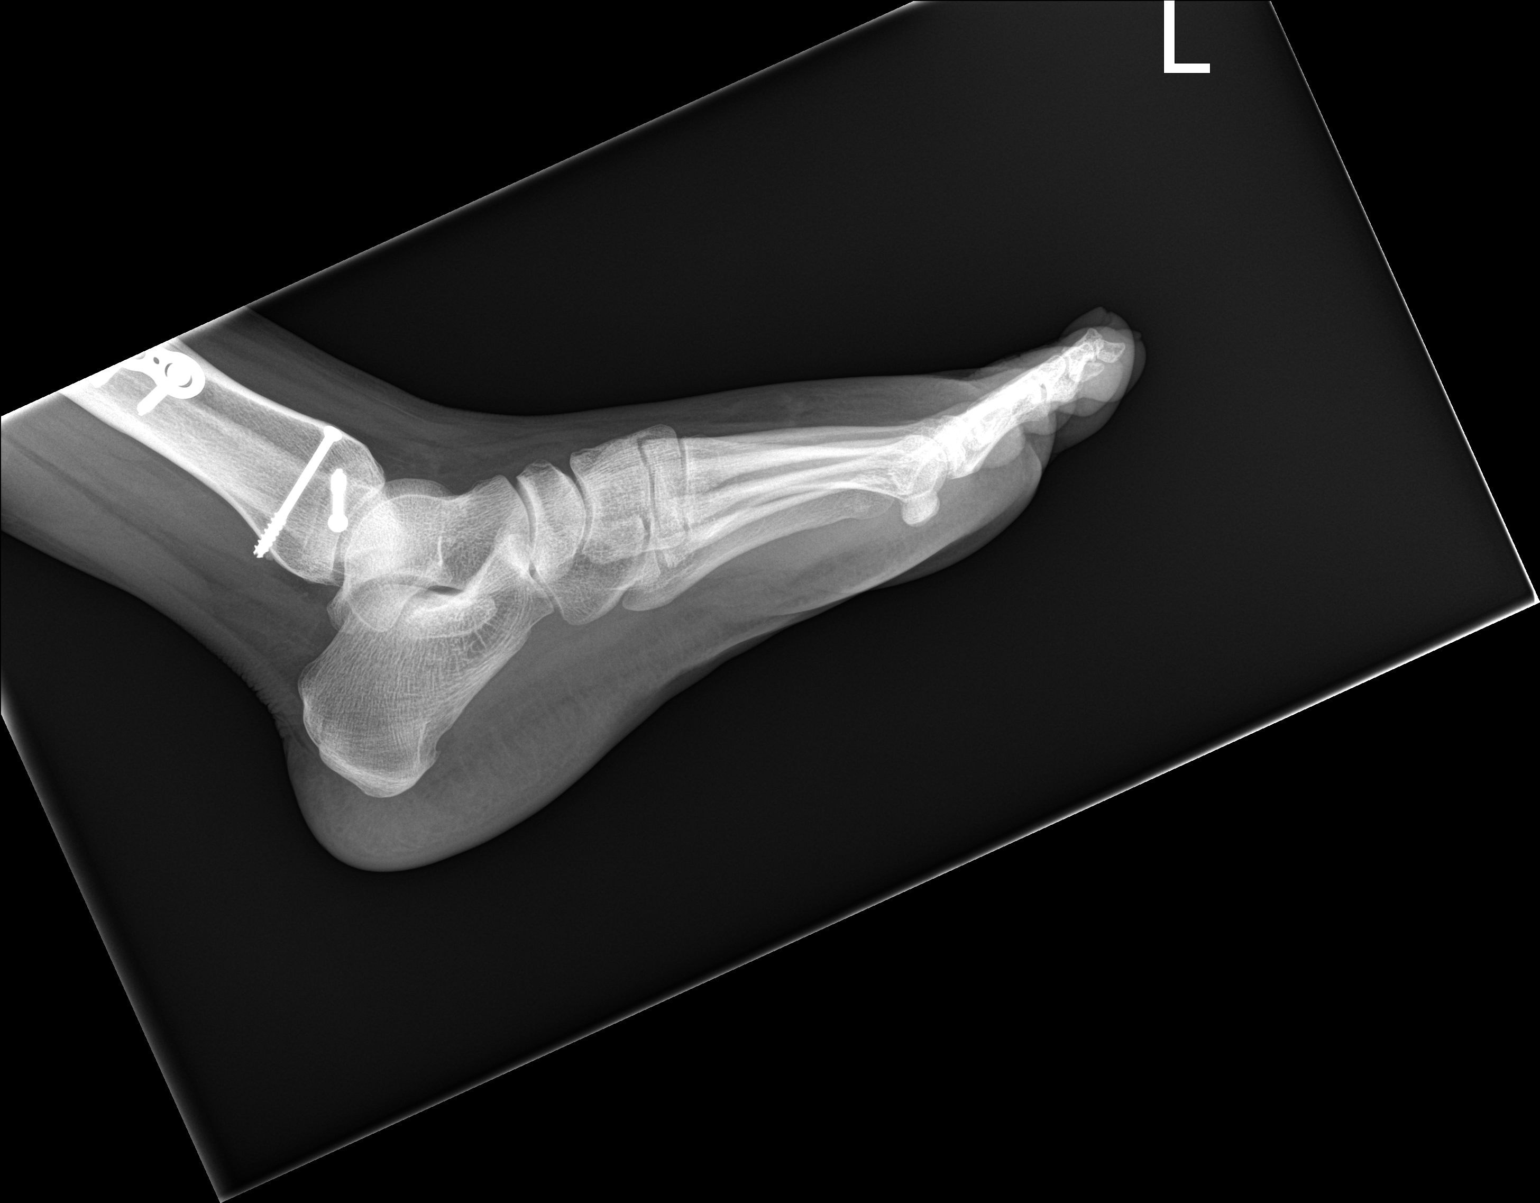

[3 of 3 positions shown; findings below may reference images not displayed]

FINDINGS: There is no evidence of fracture or dislocation. There is no
evidence of arthropathy or other focal bone abnormality. Soft
tissues are unremarkable. Partially visualized surgical hardware in
the distal tibia.
IMPRESSION: Negative.

## 2021-12-09 ENCOUNTER — Encounter (HOSPITAL_COMMUNITY): Payer: Self-pay | Admitting: Emergency Medicine

## 2021-12-09 ENCOUNTER — Other Ambulatory Visit: Payer: Self-pay

## 2021-12-09 DIAGNOSIS — Z91040 Latex allergy status: Secondary | ICD-10-CM | POA: Insufficient documentation

## 2021-12-09 DIAGNOSIS — Z1152 Encounter for screening for COVID-19: Secondary | ICD-10-CM | POA: Insufficient documentation

## 2021-12-09 DIAGNOSIS — R1110 Vomiting, unspecified: Secondary | ICD-10-CM | POA: Insufficient documentation

## 2021-12-09 DIAGNOSIS — R059 Cough, unspecified: Secondary | ICD-10-CM | POA: Diagnosis not present

## 2021-12-09 DIAGNOSIS — R0981 Nasal congestion: Secondary | ICD-10-CM | POA: Insufficient documentation

## 2021-12-09 DIAGNOSIS — J111 Influenza due to unidentified influenza virus with other respiratory manifestations: Secondary | ICD-10-CM | POA: Diagnosis not present

## 2021-12-09 LAB — RESP PANEL BY RT-PCR (FLU A&B, COVID) ARPGX2
Influenza A by PCR: NEGATIVE
Influenza B by PCR: NEGATIVE
SARS Coronavirus 2 by RT PCR: NEGATIVE

## 2021-12-09 NOTE — ED Triage Notes (Signed)
Pt in with son for similar sickness. States cough, emesis and fever x 3 days. Generalized body aches

## 2021-12-10 ENCOUNTER — Emergency Department (HOSPITAL_COMMUNITY)
Admission: EM | Admit: 2021-12-10 | Discharge: 2021-12-10 | Disposition: A | Discharge status: Home / Self Care | Payer: Medicaid Other | Attending: Emergency Medicine | Admitting: Emergency Medicine

## 2021-12-10 DIAGNOSIS — J111 Influenza due to unidentified influenza virus with other respiratory manifestations: Secondary | ICD-10-CM

## 2021-12-10 MED ORDER — ONDANSETRON 4 MG PO TBDP: ORAL_TABLET | ORAL | 0 refills | Status: DC

## 2021-12-10 MED ORDER — PSEUDOEPHEDRINE HCL 60 MG PO TABS: 60.0000 mg | ORAL_TABLET | ORAL | 0 refills | Status: DC | PRN

## 2021-12-10 NOTE — ED Provider Notes (Signed)
[facility] EMERGENCY DEPARTMENT Provider Note   CSN: [id number] Arrival date & time: 12/09/21  2214     History  Chief Complaint  Patient presents with   Cough   Emesis    Tiffany [name] is a 31 y.o. female.   Cough Cough characteristics:  Dry Sputum characteristics:  Nondescript Severity:  Moderate Onset quality:  Gradual Duration:  3 days Timing:  Intermittent Progression:  Improving Relieved by:  Cough suppressants Associated symptoms: chills, fever, rhinorrhea and sinus congestion   Emesis Severity:  Mild Quality:  Stomach contents Progression:  Resolved Associated symptoms: chills, cough and fever        Home Medications Prior to Admission medications   Medication Sig Start Date End Date Taking? Authorizing Provider  ondansetron (ZOFRAN-ODT) 4 MG disintegrating tablet '4mg'$  ODT q4 hours prn nausea/vomit 12/10/21  Yes Harry Shuck, Corene Cornea, MD  pseudoephedrine (SUDAFED) 60 MG tablet Take 1 tablet (60 mg total) by mouth every 4 (four) hours as needed for congestion. 12/10/21  Yes Zaid Tomes, Corene Cornea, MD  citalopram (CELEXA) 40 MG tablet Take 1 tablet (40 mg total) by mouth daily. 11/01/20   Loman Brooklyn, FNP  meloxicam (MOBIC) 15 MG tablet Take 1 tablet (15 mg total) by mouth daily. 06/12/21   Loman Brooklyn, FNP  traZODone (DESYREL) 50 MG tablet 25-50 mg at night for insomnia 06/19/20   Loman Brooklyn, FNP      Allergies    Latex and Other    Review of Systems   Review of Systems  Constitutional:  Positive for chills and fever.  HENT:  Positive for rhinorrhea.   Respiratory:  Positive for cough.   Gastrointestinal:  Positive for vomiting.    Physical Exam Updated Vital Signs BP 126/82 (BP Location: Right Arm)   Pulse 83   Temp 99.2 F (37.3 C) (Oral)   Resp 18   Wt 118.2 kg   LMP 11/26/2021   SpO2 98%   BMI 40.82 kg/m  Physical Exam Vitals and nursing note reviewed.  Constitutional:      Appearance: She is well-developed.  HENT:     Head:  Normocephalic and atraumatic.  Eyes:     Pupils: Pupils are equal, round, and reactive to light.  Cardiovascular:     Rate and Rhythm: Normal rate and regular rhythm.  Pulmonary:     Effort: No respiratory distress.     Breath sounds: No stridor.  Abdominal:     General: There is no distension.  Musculoskeletal:     Cervical back: Normal range of motion.  Neurological:     Mental Status: She is alert.     ED Results / Procedures / Treatments   Labs (all labs ordered are listed, but only abnormal results are displayed) Labs Reviewed  RESP PANEL BY RT-PCR (FLU A&B, COVID) ARPGX2    EKG None  Radiology No results found.  Procedures Procedures    Medications Ordered in ED Medications - No data to display  ED Course/ Medical Decision Making/ A&P                           Medical Decision Making Risk Prescription drug management.   31 yo F with cough, congestion, and URI symptoms for about 3 days. She is happy and normal  on exam, no barky cough to suggest croup, no otitis on exam.  No signs of meningitis,  She has normal RR, normal O2 sats so unlikely pneumonia.  Pt with likely viral syndrome.  Discussed symptomatic care.  Will have follow up with PCP if not improved in 2-3 days.  Discussed signs that warrant sooner reevaluation.    Final Clinical Impression(s) / ED Diagnoses Final diagnoses:  Influenza-like illness    Rx / DC Orders ED Discharge Orders          Ordered    pseudoephedrine (SUDAFED) 60 MG tablet  Every 4 hours PRN        12/10/21 0143    ondansetron (ZOFRAN-ODT) 4 MG disintegrating tablet        12/10/21 0143              Alijah Akram, Corene Cornea, MD 12/10/21 [phone]

## 2022-03-26 ENCOUNTER — Encounter: Payer: Self-pay | Admitting: Radiology

## 2022-04-08 DIAGNOSIS — M25512 Pain in left shoulder: Secondary | ICD-10-CM | POA: Diagnosis not present

## 2022-04-08 DIAGNOSIS — Z113 Encounter for screening for infections with a predominantly sexual mode of transmission: Secondary | ICD-10-CM | POA: Diagnosis not present

## 2022-04-30 ENCOUNTER — Encounter: Payer: Self-pay | Admitting: Family Medicine

## 2022-04-30 ENCOUNTER — Ambulatory Visit: Payer: Medicaid Other | Admitting: Family Medicine

## 2022-04-30 VITALS — BP 132/80 | HR 84 | Temp 97.6°F | Ht 67.0 in | Wt 264.2 lb

## 2022-04-30 DIAGNOSIS — N898 Other specified noninflammatory disorders of vagina: Secondary | ICD-10-CM

## 2022-04-30 LAB — WET PREP FOR TRICH, YEAST, CLUE
Trichomonas Exam: NEGATIVE
Yeast Exam: NEGATIVE

## 2022-04-30 NOTE — Addendum Note (Signed)
Addended by: Baruch Gouty on: 04/30/2022 04:39 PM   Modules accepted: Orders

## 2022-04-30 NOTE — Progress Notes (Signed)
Subjective:  Patient ID: Tiffany [name]. Tiffany [name], female    DOB: Nov 01, 1990, 32 y.o.   MRN: [id number]  Patient Care Team: Estill Dooms, NP as Nurse Practitioner (Obstetrics and Gynecology)   Chief Complaint:  Vaginal Itching (Recently had trich /)   HPI: Tiffany [name] is a 32 y.o. female presenting on 04/30/2022 for Vaginal Itching (Recently had trich /)   Vaginal Itching The patient's primary symptoms include genital itching. The patient's pertinent negatives include no genital lesions, genital odor, genital rash, missed menses, pelvic pain, vaginal bleeding or vaginal discharge. This is a new problem. The problem has been gradually improving. The patient is experiencing no pain. She is not pregnant. Pertinent negatives include no abdominal pain, anorexia, back pain, chills, constipation, diarrhea, discolored urine, dysuria, fever, flank pain, frequency, headaches, hematuria, joint pain, joint swelling, nausea, painful intercourse, rash, urgency or vomiting. Treatments tried: recently treated for trich, symptoms have improved but not resolved. She is sexually active. Partner with STD symptoms: recently treated for trich. Her menstrual history has been regular. Her past medical history is significant for an STD.   Relevant past medical, surgical, family, and social history reviewed and updated as indicated.  Allergies and medications reviewed and updated. Data reviewed: Chart in Epic.   Past Medical History:  Diagnosis Date   Anxiety    Chronic back pain    Depression    Vaginal Pap smear, abnormal     Past Surgical History:  Procedure Laterality Date   EXTERNAL EAR SURGERY Left    LAPAROSCOPIC BILATERAL SALPINGECTOMY Bilateral 02/21/2020   Procedure: LAPAROSCOPIC BILATERAL TUBAL LIGATION, BILATERAL SALPINGECTOMY TECHNIQUE;  Surgeon: Florian Buff, MD;  Location: [facility];  Service: Gynecology;  Laterality: Bilateral;   LEG SURGERY Left     Social History   Socioeconomic  History   Marital status: Single    Spouse name: Not on file   Number of children: 2   Years of education: Not on file   Highest education level: Not on file  Occupational History   Not on file  Tobacco Use   Smoking status: Every Day    Years: 2    Types: Cigarettes   Smokeless tobacco: Never  Vaping Use   Vaping Use: Never used  Substance and Sexual Activity   Alcohol use: Yes    Comment: occ   Drug use: Not Currently    Types: Marijuana    Comment: used in the past   Sexual activity: Yes    Birth control/protection: Surgical, Condom  Other Topics Concern   Not on file  Social History Narrative   Not on file   Social Determinants of Health   Financial Resource Strain: Not on file  Food Insecurity: Not on file  Transportation Needs: Not on file  Physical Activity: Not on file  Stress: Not on file  Social Connections: Not on file  Intimate Partner Violence: Not on file    Outpatient Encounter Medications as of 04/30/2022  Medication Sig   meloxicam (MOBIC) 15 MG tablet Take 1 tablet (15 mg total) by mouth daily.   traZODone (DESYREL) 50 MG tablet 25-50 mg at night for insomnia   citalopram (CELEXA) 40 MG tablet Take 1 tablet (40 mg total) by mouth daily. (Patient not taking: Reported on 04/30/2022)   [DISCONTINUED] ondansetron (ZOFRAN-ODT) 4 MG disintegrating tablet 4mg  ODT q4 hours prn nausea/vomit   [DISCONTINUED] pseudoephedrine (SUDAFED) 60 MG tablet Take 1 tablet (60 mg total) by mouth every 4 (  four) hours as needed for congestion.   No facility-administered encounter medications on file as of 04/30/2022.    Allergies  Allergen Reactions   Latex Rash   Other Itching and Swelling    Coconut    Review of Systems  Constitutional:  Negative for activity change, appetite change, chills, diaphoresis, fatigue, fever and unexpected weight change.  HENT: Negative.    Eyes: Negative.   Respiratory:  Negative for cough, chest tightness and shortness of breath.    Cardiovascular:  Negative for chest pain, palpitations and leg swelling.  Gastrointestinal:  Negative for abdominal pain, anorexia, blood in stool, constipation, diarrhea, nausea and vomiting.  Endocrine: Negative.   Genitourinary:  Negative for decreased urine volume, difficulty urinating, dyspareunia, dysuria, enuresis, flank pain, frequency, genital sores, hematuria, menstrual problem, missed menses, pelvic pain, urgency, vaginal bleeding, vaginal discharge and vaginal pain.       Vaginal itching  Musculoskeletal:  Negative for arthralgias, back pain, joint pain and myalgias.  Skin: Negative.  Negative for rash.  Allergic/Immunologic: Negative.   Neurological:  Negative for dizziness and headaches.  Hematological: Negative.   Psychiatric/Behavioral:  Negative for confusion, hallucinations, sleep disturbance and suicidal ideas.   All other systems reviewed and are negative.       Objective:  BP 132/80   Pulse 84   Temp 97.6 F (36.4 C) (Temporal)   Ht 5\' 7"  (1.702 m)   Wt 264 lb 3.2 oz (119.8 kg)   SpO2 97%   BMI 41.38 kg/m    Wt Readings from Last 3 Encounters:  04/30/22 264 lb 3.2 oz (119.8 kg)  12/09/21 260 lb 9.6 oz (118.2 kg)  06/12/21 270 lb 9.6 oz (122.7 kg)    Physical Exam Vitals and nursing note reviewed.  Constitutional:      General: She is not in acute distress.    Appearance: Normal appearance. She is morbidly obese. She is not ill-appearing, toxic-appearing or diaphoretic.  HENT:     Head: Normocephalic and atraumatic.     Mouth/Throat:     Mouth: Mucous membranes are moist.  Eyes:     Conjunctiva/sclera: Conjunctivae normal.     Pupils: Pupils are equal, round, and reactive to light.  Cardiovascular:     Rate and Rhythm: Normal rate and regular rhythm.     Heart sounds: Normal heart sounds.  Pulmonary:     Effort: Pulmonary effort is normal.     Breath sounds: Normal breath sounds.  Abdominal:     Palpations: Abdomen is soft.  Skin:     General: Skin is warm and dry.     Capillary Refill: Capillary refill takes less than 2 seconds.  Neurological:     General: No focal deficit present.     Mental Status: She is alert and oriented to person, place, and time.  Psychiatric:        Mood and Affect: Mood normal.        Behavior: Behavior normal.        Thought Content: Thought content normal.        Judgment: Judgment normal.     Results for orders placed or performed during the hospital encounter of 12/10/21  Resp Panel by RT-PCR (Flu A&B, Covid) Anterior Nasal Swab   Specimen: Anterior Nasal Swab  Result Value Ref Range   SARS Coronavirus 2 by RT PCR NEGATIVE NEGATIVE   Influenza A by PCR NEGATIVE NEGATIVE   Influenza B by PCR NEGATIVE NEGATIVE  Pertinent labs & imaging results that were available during my care of the patient were reviewed by me and considered in my medical decision making.  Assessment & Plan:  Sabrea was seen today for vaginal itching.  Diagnoses and all orders for this visit:  Vagina itching Wet prep negative in office. Will send urine off for GC/Ct and trich testing. Vaginal hygiene discussed in detail. Abstinence until results are confirmed.  -     WET PREP FOR TRICH, YEAST, CLUE -     Ct Ng M genitalium NAA, Urine     Continue all other maintenance medications.  Follow up plan: Return for needs appointment to establish care.   Continue healthy lifestyle choices, including diet (rich in fruits, vegetables, and lean proteins, and low in salt and simple carbohydrates) and exercise (at least 30 minutes of moderate physical activity daily).  Educational handout given for vaginal itching  The above assessment and management plan was discussed with the patient. The patient verbalized understanding of and has agreed to the management plan. Patient is aware to call the clinic if they develop any new symptoms or if symptoms persist or worsen. Patient is aware when to return to the clinic  for a follow-up visit. Patient educated on when it is appropriate to go to the emergency department.   Monia Pouch, FNP-C [facility] [phone]

## 2022-04-30 NOTE — Patient Instructions (Signed)
Healthy vaginal hygiene practices   -  Avoid sleeper pajamas. Nightgowns allow air to circulate.  Sleep without underpants whenever possible.  -  Wear cotton underpants during the day. Double-rinse underwear after washing to avoid residual irritants. Do not use fabric softeners for underwear and swimsuits.  - Avoid tights, leotards, leggings, "skinny" jeans, and other tight-fitting clothing. Skirts and loose-fitting pants allow air to circulate.  - Avoid pantyliners.  Instead use tampons or cotton pads.  - Daily warm bathing is helpful:     - Soak in clean water (no soap) for 10 to 15 minutes.     - Use soap to wash regions other than the genital area just before getting out of the tub or drain water and take a shower to wash your body. Limit use of any soap on genital areas. Use   fragance-free soaps.     - Rinse the genital area well and gently pat dry.  Don't rub.  Hair dryer to assist with drying can be used only if on cool setting.     - Do not use bubble baths or perfumed soaps.  - Do not use any feminine sprays, douches or powders.  These contain chemicals that will irritate the skin.  - If the genital area is tender or swollen, cool compresses may relieve the discomfort. Unscented wet wipes can be used instead of toilet paper for wiping.   - Emollients, such as Vaseline, may help protect skin and can be applied to the irritated area.  - Always remember to wipe front-to-back after bowel movements. Pat dry after urination.  - Do not sit in wet swimsuits for long periods of time after swimming  

## 2022-05-02 LAB — CT NG M GENITALIUM NAA, URINE
Chlamydia trachomatis, NAA: NEGATIVE
Mycoplasma genitalium NAA: NEGATIVE
Neisseria gonorrhoeae, NAA: NEGATIVE

## 2022-05-02 LAB — TRICHOMONAS VAGINALIS, PROBE AMP: Trich vag by NAA: NEGATIVE

## 2022-05-15 ENCOUNTER — Encounter: Payer: Self-pay | Admitting: Family Medicine

## 2022-05-15 ENCOUNTER — Ambulatory Visit (INDEPENDENT_AMBULATORY_CARE_PROVIDER_SITE_OTHER): Payer: Medicaid Other | Admitting: Family Medicine

## 2022-05-15 DIAGNOSIS — F419 Anxiety disorder, unspecified: Secondary | ICD-10-CM

## 2022-05-15 DIAGNOSIS — F331 Major depressive disorder, recurrent, moderate: Secondary | ICD-10-CM | POA: Diagnosis not present

## 2022-05-15 LAB — BAYER DCA HB A1C WAIVED: HB A1C (BAYER DCA - WAIVED): 6.1 % — ABNORMAL HIGH (ref 4.8–5.6)

## 2022-05-15 NOTE — Patient Instructions (Addendum)
If your symptoms worsen or you have thoughts of suicide/homicide, PLEASE SEEK IMMEDIATE MEDICAL ATTENTION.  You may always call the National Suicide Hotline.  This is available 24 hours a day, 7 days a week.  Their number is: 1-800-273-8255  Taking the medicine as directed and not missing any doses is one of the best things you can do to treat your depression.  Here are some things to keep in mind:  Side effects (stomach upset, some increased anxiety) may happen before you notice a benefit.  These side effects typically go away over time. Changes to your dose of medicine or a change in medication all together is sometimes necessary Most people need to be on medication at least 12 months Many people will notice an improvement within two weeks but the full effect of the medication can take up to 4-6 weeks Stopping the medication when you start feeling better often results in a return of symptoms Never discontinue your medication without contacting a health care professional first.  Some medications require gradual discontinuation/ taper and can make you sick if you stop them abruptly.  If your symptoms worsen or you have thoughts of suicide/homicide, PLEASE SEEK IMMEDIATE MEDICAL ATTENTION.  You may always call:  National Suicide Hotline: 800-273-8255 [facility]: 336-832-9700 Crisis Recovery in [place]: 800-939-5911  These are available 24 hours a day, 7 days a week.   Your provider wants you to schedule an appointment with a Psychologist/Psychiatrist. The following list of offices requires the patient to call and make their own appointment, as there is information they need that only you can provide. Please feel free to choose form the following providers:  [facility]   [phone] Crisis Recovery in [place] [phone]  [facility]  [phone]   [address] [place], [place]  (Scheduled through [facility]) Must call and do an  interview for appointment. Sees Children / Accepts Medicaid  [facility]    [phone]  [address], [address]    [place], [place]       [facility]  [phone] [address] [place], [place]  Evaluates for Autism but does not treat it Sees Children / Accepts Medicaid  [facility]    [phone] [address], [address]   [place], [place] Medication management, substance abuse, bipolar, grief, family, marriage, OCD, anxiety, PTSD Sees children / Accepts Medicaid  [facility]    [phone] [address] [place], [place] Sees children / Accepts Medicaid  [facility]  [phone] [address] [place], [place]   Dr Akinlayo     [phone] [address] [place], [place]  Sees ADD & ADHD for treatment Accepts Medicaid  [facility]  [phone] [address] [place], [place] Evaluates for Autism Accepts Medicaid  [facility]  [phone] [address] [place], [place]  Does Adult ADD evaluations Does not accept Medicaid  [facility]   [phone] [address]   [place], [place] Uses animal therapy  Sees children as young as 3 years old Accepts Medicaid  [facility]     [phone]    [address]  [place],  [address] Sees children Accepts Medicaid  

## 2022-05-15 NOTE — Progress Notes (Signed)
Subjective:  Patient ID: Tiffany [name]. [name], female    DOB: April 12, 1990, 32 y.o.   MRN: [id number]  Patient Care Team: Sonny Masters, FNP as PCP - General (Family Medicine) Adline Potter, NP as Nurse Practitioner (Obstetrics and Gynecology)   Chief Complaint:  Establish Care (Previous Tiffany [name] patient ) and Depression (States the celexa makes her feel like a zombie )   HPI: Tiffany [name] is a 32 y.o. female presenting on 05/15/2022 for Establish Care (Previous Tiffany [name] patient ) and Depression (States the celexa makes her feel like a zombie )   1. MDD (major depressive disorder), recurrent episode, moderate 2. Anxiety Pt states her symptoms are persistent and worse at times despite medications. States the Celexa makes her feel like a zombie at times. She denies active SI or HI thoughts. Has had suicidal thoughts in the past, this was several years ago. Has had GeneSight testing completed, this is not scanned into the chart. She states she has been on several medications in the past without great control of symptoms.     05/15/2022    3:18 PM 04/30/2022    3:50 PM 06/12/2021    3:05 PM 11/01/2020    2:21 PM 08/27/2020    9:04 AM  Depression screen PHQ 2/9  Decreased Interest 2 1 3 2 1   Down, Depressed, Hopeless 2 1 2 3 1   PHQ - 2 Score 4 2 5 5 2   Altered sleeping 3 1 3 3 1   Tired, decreased energy 2 2 3 3 1   Change in appetite 2 2 3 2 1   Feeling bad or failure about yourself  2 1 2 2  0  Trouble concentrating 0 0 0 1 0  Moving slowly or fidgety/restless 0 0 2 0 0  Suicidal thoughts 0 0 0 0 0  PHQ-9 Score 13 8 18 16 5   Difficult doing work/chores   Very difficult Somewhat difficult Not difficult at all      05/15/2022    3:18 PM 04/30/2022    3:51 PM 06/12/2021    3:05 PM 11/01/2020    2:21 PM  GAD 7 : Generalized Anxiety Score  Nervous, Anxious, on Edge 2 1 3 2   Control/stop worrying 3 1 3 3   Worry too much - different things 3 1 3 3   Trouble relaxing 2 1 3 2   Restless 0 2 3 3    Easily annoyed or irritable 3 1 3 3   Afraid - awful might happen 2 3 2 1   Total GAD 7 Score 15 10 20 17   Anxiety Difficulty   Not difficult at all Somewhat difficult      3. Morbid obesity Does not follow a diet or exercise routine. No recent labs to check thyroid function.     Relevant past medical, surgical, family, and social history reviewed and updated as indicated.  Allergies and medications reviewed and updated. Data reviewed: Chart in Epic.   Past Medical History:  Diagnosis Date   Anxiety    Chronic back pain    Depression    Vaginal Pap smear, abnormal     Past Surgical History:  Procedure Laterality Date   EXTERNAL EAR SURGERY Left    LAPAROSCOPIC BILATERAL SALPINGECTOMY Bilateral 02/21/2020   Procedure: LAPAROSCOPIC BILATERAL TUBAL LIGATION, BILATERAL SALPINGECTOMY TECHNIQUE;  Surgeon: Lazaro Arms, MD;  Location: [facility];  Service: Gynecology;  Laterality: Bilateral;   LEG SURGERY Left     Social History   Socioeconomic History  Marital status: Single    Spouse name: Not on file   Number of children: 2   Years of education: Not on file   Highest education level: Not on file  Occupational History   Not on file  Tobacco Use   Smoking status: Former    Types: Cigarettes    Quit date: 03/2022    Years since quitting: 0.1   Smokeless tobacco: Never  Vaping Use   Vaping Use: Never used  Substance and Sexual Activity   Alcohol use: Yes    Comment: occ   Drug use: Not Currently    Types: Marijuana    Comment: used in the past   Sexual activity: Yes    Birth control/protection: Surgical, Condom  Other Topics Concern   Not on file  Social History Narrative   Not on file   Social Determinants of Health   Financial Resource Strain: Not on file  Food Insecurity: Not on file  Transportation Needs: Not on file  Physical Activity: Not on file  Stress: Not on file  Social Connections: Not on file  Intimate Partner Violence: Not on file     Outpatient Encounter Medications as of 05/15/2022  Medication Sig   citalopram (CELEXA) 40 MG tablet Take 1 tablet (40 mg total) by mouth daily.   meloxicam (MOBIC) 15 MG tablet Take 1 tablet (15 mg total) by mouth daily.   traZODone (DESYREL) 50 MG tablet 25-50 mg at night for insomnia   No facility-administered encounter medications on file as of 05/15/2022.    Allergies  Allergen Reactions   Latex Rash   Other Itching and Swelling    Coconut    Review of Systems  Constitutional:  Positive for activity change, appetite change and fatigue. Negative for chills, diaphoresis, fever and unexpected weight change.  HENT: Negative.    Eyes: Negative.  Negative for photophobia and visual disturbance.  Respiratory:  Negative for cough, chest tightness and shortness of breath.   Cardiovascular:  Negative for chest pain, palpitations and leg swelling.  Gastrointestinal:  Negative for abdominal pain, blood in stool, constipation, diarrhea, nausea and vomiting.  Endocrine: Negative.   Genitourinary:  Negative for decreased urine volume, difficulty urinating, dysuria, frequency and urgency.  Musculoskeletal:  Negative for arthralgias and myalgias.  Skin: Negative.   Allergic/Immunologic: Negative.   Neurological:  Negative for dizziness, tremors, seizures, syncope, facial asymmetry, speech difficulty, weakness, light-headedness, numbness and headaches.  Hematological: Negative.   Psychiatric/Behavioral:  Positive for agitation, decreased concentration, dysphoric mood and sleep disturbance. Negative for behavioral problems, confusion, hallucinations, self-injury and suicidal ideas. The patient is nervous/anxious. The patient is not hyperactive.   All other systems reviewed and are negative.       Objective:  BP 116/74   Pulse 81   Temp 97.8 F (36.6 C) (Temporal)   Ht 5\' 7"  (1.702 m)   Wt 264 lb 6.4 oz (119.9 kg)   SpO2 98%   BMI 41.41 kg/m    Wt Readings from Last 3 Encounters:   05/15/22 264 lb 6.4 oz (119.9 kg)  04/30/22 264 lb 3.2 oz (119.8 kg)  12/09/21 260 lb 9.6 oz (118.2 kg)    Physical Exam Vitals and nursing note reviewed.  Constitutional:      General: She is not in acute distress.    Appearance: Normal appearance. She is well-developed and well-groomed. She is morbidly obese. She is not ill-appearing, toxic-appearing or diaphoretic.  HENT:     Head: Normocephalic and atraumatic.  Jaw: There is normal jaw occlusion.     Right Ear: Hearing normal.     Left Ear: Hearing normal.     Nose: Nose normal.     Mouth/Throat:     Lips: Pink.     Mouth: Mucous membranes are moist.     Pharynx: Oropharynx is clear. Uvula midline.  Eyes:     General: Lids are normal.     Extraocular Movements: Extraocular movements intact.     Conjunctiva/sclera: Conjunctivae normal.     Pupils: Pupils are equal, round, and reactive to light.  Neck:     Trachea: Trachea and phonation normal.  Cardiovascular:     Rate and Rhythm: Normal rate and regular rhythm.     Chest Wall: PMI is not displaced.     Pulses: Normal pulses.     Heart sounds: Normal heart sounds.     No gallop.  Pulmonary:     Effort: Pulmonary effort is normal.     Breath sounds: Normal breath sounds.  Abdominal:     General: There is no abdominal bruit.     Palpations: There is no hepatomegaly or splenomegaly.  Musculoskeletal:        General: Normal range of motion.     Cervical back: Normal range of motion and neck supple.     Right lower leg: No edema.     Left lower leg: No edema.  Skin:    General: Skin is warm and dry.     Capillary Refill: Capillary refill takes less than 2 seconds.     Coloration: Skin is not cyanotic, jaundiced or pale.     Findings: No rash.  Neurological:     General: No focal deficit present.     Mental Status: She is alert and oriented to person, place, and time.     Sensory: Sensation is intact.     Motor: Motor function is intact.     Coordination:  Coordination is intact.     Gait: Gait is intact.     Deep Tendon Reflexes: Reflexes are normal and symmetric.  Psychiatric:        Attention and Perception: Attention and perception normal.        Mood and Affect: Mood and affect normal.        Speech: Speech normal.        Behavior: Behavior normal. Behavior is cooperative.        Thought Content: Thought content normal.        Cognition and Memory: Cognition and memory normal.        Judgment: Judgment normal.     Results for orders placed or performed in visit on 04/30/22  WET PREP FOR TRICH, YEAST, CLUE   Specimen: Vaginal Swab   Vaginal Swab  Result Value Ref Range   Trichomonas Exam Negative Negative   Yeast Exam Negative Negative   Clue Cell Exam Comment: Negative  Trichomonas vaginalis, RNA   Specimen: Urine   UR  Result Value Ref Range   Trich vag by NAA Negative Negative  Ct Ng M genitalium NAA, Urine  Result Value Ref Range   Mycoplasma genitalium NAA Negative Negative   Chlamydia trachomatis, NAA Negative Negative   Neisseria gonorrhoeae, NAA Negative Negative       Pertinent labs & imaging results that were available during my care of the patient were reviewed by me and considered in my medical decision making.  Assessment & Plan:  Ebelyn was seen today for  establish care and depression.  Diagnoses and all orders for this visit:  MDD (major depressive disorder), recurrent episode, moderate Anxiety Persistent symptoms despite medications. Denies active SI or HI. Will refer to psychiatry. -     Ambulatory referral to Psychiatry -     CBC with Differential/Platelet -     CMP14+EGFR -     Thyroid Panel With TSH -     VITAMIN D 25 Hydroxy (Vit-D Deficiency, Fractures)  Morbid obesity Diet and exercise encouraged. Labs pending.  -     CBC with Differential/Platelet -     CMP14+EGFR -     Thyroid Panel With TSH -     VITAMIN D 25 Hydroxy (Vit-D Deficiency, Fractures) -     Bayer DCA Hb A1c Waived      Continue all other maintenance medications.  Follow up plan: Return in 3 months (on 08/14/2022), or if symptoms worsen or fail to improve, for CPE.   Continue healthy lifestyle choices, including diet (rich in fruits, vegetables, and lean proteins, and low in salt and simple carbohydrates) and exercise (at least 30 minutes of moderate physical activity daily).  Educational handout given for crisis intervention hotline, BH resources.   The above assessment and management plan was discussed with the patient. The patient verbalized understanding of and has agreed to the management plan. Patient is aware to call the clinic if they develop any new symptoms or if symptoms persist or worsen. Patient is aware when to return to the clinic for a follow-up visit. Patient educated on when it is appropriate to go to the emergency department.   Kari Baars, FNP-C [facility] [phone]

## 2022-05-16 LAB — CBC WITH DIFFERENTIAL/PLATELET
Basophils Absolute: 0 10*3/uL (ref 0.0–0.2)
Basos: 1 %
EOS (ABSOLUTE): 0.4 10*3/uL (ref 0.0–0.4)
Eos: 6 %
Hematocrit: 40.4 % (ref 34.0–46.6)
Hemoglobin: 13 g/dL (ref 11.1–15.9)
Immature Grans (Abs): 0 10*3/uL (ref 0.0–0.1)
Immature Granulocytes: 0 %
Lymphocytes Absolute: 3.5 10*3/uL — ABNORMAL HIGH (ref 0.7–3.1)
Lymphs: 47 %
MCH: 26.1 pg — ABNORMAL LOW (ref 26.6–33.0)
MCHC: 32.2 g/dL (ref 31.5–35.7)
MCV: 81 fL (ref 79–97)
Monocytes Absolute: 0.4 10*3/uL (ref 0.1–0.9)
Monocytes: 5 %
Neutrophils Absolute: 3 10*3/uL (ref 1.4–7.0)
Neutrophils: 41 %
Platelets: 400 10*3/uL (ref 150–450)
RBC: 4.99 x10E6/uL (ref 3.77–5.28)
RDW: 14.3 % (ref 11.7–15.4)
WBC: 7.4 10*3/uL (ref 3.4–10.8)

## 2022-05-16 LAB — CMP14+EGFR
ALT: 11 IU/L (ref 0–32)
AST: 12 IU/L (ref 0–40)
Albumin/Globulin Ratio: 2 (ref 1.2–2.2)
Albumin: 4.3 g/dL (ref 3.9–4.9)
Alkaline Phosphatase: 67 IU/L (ref 44–121)
BUN/Creatinine Ratio: 13 (ref 9–23)
BUN: 12 mg/dL (ref 6–20)
Bilirubin Total: 0.3 mg/dL (ref 0.0–1.2)
CO2: 24 mmol/L (ref 20–29)
Calcium: 9.5 mg/dL (ref 8.7–10.2)
Chloride: 103 mmol/L (ref 96–106)
Creatinine, Ser: 0.91 mg/dL (ref 0.57–1.00)
Globulin, Total: 2.2 g/dL (ref 1.5–4.5)
Glucose: 114 mg/dL — ABNORMAL HIGH (ref 70–99)
Potassium: 4.1 mmol/L (ref 3.5–5.2)
Sodium: 140 mmol/L (ref 134–144)
Total Protein: 6.5 g/dL (ref 6.0–8.5)
eGFR: 86 mL/min/{1.73_m2} (ref >59)

## 2022-05-16 LAB — THYROID PANEL WITH TSH
Free Thyroxine Index: 1.6 (ref 1.2–4.9)
T3 Uptake Ratio: 24 % (ref 24–39)
T4, Total: 6.8 ug/dL (ref 4.5–12.0)
TSH: 0.987 u[IU]/mL (ref 0.450–4.500)

## 2022-05-16 LAB — VITAMIN D 25 HYDROXY (VIT D DEFICIENCY, FRACTURES): Vit D, 25-Hydroxy: 21.3 ng/mL — ABNORMAL LOW (ref 30.0–100.0)

## 2022-05-18 ENCOUNTER — Encounter: Payer: Self-pay | Admitting: Family Medicine

## 2022-05-19 ENCOUNTER — Encounter: Payer: Self-pay | Admitting: Family Medicine

## 2022-09-16 ENCOUNTER — Encounter: Payer: Medicaid Other | Admitting: Family Medicine

## 2022-11-03 ENCOUNTER — Encounter: Payer: Self-pay | Admitting: Nurse Practitioner

## 2022-11-03 ENCOUNTER — Ambulatory Visit: Payer: Medicaid Other | Admitting: Nurse Practitioner

## 2022-11-03 VITALS — BP 129/85 | HR 92 | Temp 97.7°F | Resp 20 | Ht 67.0 in | Wt 264.0 lb

## 2022-11-03 DIAGNOSIS — N926 Irregular menstruation, unspecified: Secondary | ICD-10-CM | POA: Diagnosis not present

## 2022-11-03 LAB — PREGNANCY, URINE: Preg Test, Ur: NEGATIVE

## 2022-11-03 NOTE — Patient Instructions (Signed)
Menorrhagia Menorrhagia is when your monthly periods are heavy or last longer than normal. If you have this condition, bleeding and cramping may make it hard for you to do your daily activities. What are the causes? Common causes of this condition include: Growths in the womb (uterus). These are polyps or fibroids. These growths are not cancer. Problems with two hormones called estrogen and progesterone. One of the ovaries not releasing an egg during one or more months. A problem with the thyroid gland. Having a device for birth control (IUD). Side effects of some medicines, such as NSAIDs or blood thinners. A disorder that stops the blood from clotting normally. What increases the risk? You are more likely to have this condition if you have cancer of the womb. What are the signs or symptoms? Having to change your pad or tampon every 1-2 hours because it is soaked. Needing to use pads and tampons at the same time because of heavy bleeding. Needing to wake up to change your pads or tampons during the night. Passing blood clots larger than 1 inch (2.5 cm) in size. Having bleeding that lasts for more than 7 days. Having symptoms of low iron levels (anemia), such as feeling tired or having shortness of breath. How is this treated? You may not need to be treated for this condition. But if you need treatment, you may be given medicines: To reduce bleeding during your period. These include birth control medicines. To make your blood thick. This slows bleeding. To reduce swelling. Medicines that do this include ibuprofen. That have a hormone called progestin. That make the ovaries stop working for a short time. To treat low iron levels. You will be given iron pills if you have this condition. If medicines do not work, surgery may be done. Surgery may be done to: Remove a part of the lining of the womb. This lining is called the endometrium. This reduces bleeding during a period. Remove growths  in the womb. These may be polyps or fibroids. Remove the entire lining of the womb. Remove the womb entirely. This procedure is called a hysterectomy. Follow these instructions at home: Medicines Take over-the-counter and prescription medicines only as told by your doctor. This includes iron pills. Do not change or switch medicines without asking your doctor. Do not take aspirin or medicines that contain aspirin 1 week before or during your period. Aspirin may make bleeding worse. Managing constipation Iron pills may cause trouble pooping (constipation). To prevent or treat problems when pooping, you may need to: Drink enough fluid to keep your pee (urine) pale yellow. Take over-the-counter or prescription medicines. Eat foods that are high in fiber. These include beans, whole grains, and fresh fruits and vegetables. Limit foods that are high in fat and sugar. These include fried or sweet foods. General instructions If you need to change your pad or tampon more than once every 2 hours, limit your activity until the bleeding stops. Eat healthy meals and foods that are high in iron. Foods that have a lot of iron include: Leafy green vegetables. Meat. Liver. Eggs. Whole-grain breads and cereals. Do not try to lose weight until your heavy bleeding has stopped and you have normal amounts of iron in your blood. If you need to lose weight, work with your doctor. Keep all follow-up visits. Contact a doctor if: You soak through a pad or tampon every 1 or 2 hours, and this happens every time you have a period. You need to use pads and   tampons at the same time because you are bleeding so much. You are taking medicine, and: You feel like you may vomit. You vomit. You have watery poop (diarrhea). You have other problems that may be related to the medicine you are taking. Get help right away if: You soak through more than a pad or tampon in 1 hour. You pass clots bigger than 1 inch (2.5 cm)  wide. You feel short of breath. You feel like your heart is beating too fast. You feel dizzy or you faint. You feel very weak or tired. Summary Menorrhagia is when your menstrual periods are heavy or last longer than normal. You may not need to be treated for this condition. If you need treatment, you may be given medicines or have surgery. Take over-the-counter and prescription medicines only as told by your doctor. This includes iron pills. Get help right away if you soak through more than a pad or tampon in 1 hour or you pass large clots. Also, get help right away if you feel dizzy, short of breath, or very weak or tired. This information is not intended to replace advice given to you by your health care provider. Make sure you discuss any questions you have with your health care provider. Document Revised: 09/26/2019 Document Reviewed: 09/26/2019 Elsevier Patient Education  2024 [employer].

## 2022-11-03 NOTE — Progress Notes (Signed)
//    Subjective:    Patient ID: Tiffany [name]. Tiffany [name], female    DOB: May 29, 1990, 32 y.o.   MRN: [id number]   Chief Complaint: missed period  HPI  Patient comes in wanting a pregnany test. She says she has done 2 pregnancy tests at home that were positive. SHe claims she had her fallopian tubes removed years ago. LMP was 10/10/22. She says she had to menses last month. She says she has a burning sensation in right pelvic area. Occurs usually monthly before her cycle. Patient Active Problem List   Diagnosis Date Noted   Morbid obesity (HCC) 05/15/2022   Generalized anxiety disorder 11/01/2020   Herpes zoster without complication 07/30/2020   Difficulty sleeping 06/19/2020   Seasonal allergies 06/19/2020   MDD (major depressive disorder), recurrent episode, moderate (HCC) 10/12/2018   Chronic right-sided low back pain with right-sided sciatica 10/12/2018   Anxiety 10/05/2017        Review of Systems  Constitutional:  Negative for diaphoresis.  Eyes:  Negative for pain.  Respiratory:  Negative for shortness of breath.   Cardiovascular:  Negative for chest pain, palpitations and leg swelling.  Gastrointestinal:  Negative for abdominal pain.  Endocrine: Negative for polydipsia.  Skin:  Negative for rash.  Neurological:  Negative for dizziness, weakness and headaches.  Hematological:  Does not bruise/bleed easily.  All other systems reviewed and are negative.      Objective:   Physical Exam Constitutional:      Appearance: Normal appearance.  Cardiovascular:     Rate and Rhythm: Normal rate and regular rhythm.     Heart sounds: Normal heart sounds.  Pulmonary:     Effort: Pulmonary effort is normal.     Breath sounds: Normal breath sounds.  Skin:    General: Skin is warm.  Neurological:     General: No focal deficit present.     Mental Status: She is alert and oriented to person, place, and time.  Psychiatric:        Mood and Affect: Mood normal.        Behavior: Behavior  normal.     BP 129/85   Pulse 92   Temp 97.7 F (36.5 C) (Temporal)   Resp 20   Ht 5\' 7"  (1.702 m)   Wt 264 lb (119.7 kg)   SpO2 100%   BMI 41.35 kg/m        Assessment & Plan:  Tiffany [name]. Rylander in today with chief complaint of No chief complaint on file.   1. Missed period - Pregnancy, urine  2. Irregular menses Only thing can do for irregluar menses is birth control. Does not want to do that at this time Discussed changes of her getting pregnant as less than 1%    The above assessment and management plan was discussed with the patient. The patient verbalized understanding of and has agreed to the management plan. Patient is aware to call the clinic if symptoms persist or worsen. Patient is aware when to return to the clinic for a follow-up visit. Patient educated on when it is appropriate to go to the emergency department.   Tiffany Daphine Deutscher, FNP

## 2023-01-06 ENCOUNTER — Other Ambulatory Visit (HOSPITAL_COMMUNITY)
Admission: RE | Admit: 2023-01-06 | Discharge: 2023-01-06 | Disposition: A | Discharge status: Home / Self Care | Payer: Medicaid Other | Source: Ambulatory Visit | Attending: Family Medicine | Admitting: Family Medicine

## 2023-01-06 ENCOUNTER — Encounter: Payer: Self-pay | Admitting: Family Medicine

## 2023-01-06 ENCOUNTER — Ambulatory Visit: Payer: Medicaid Other | Admitting: Family Medicine

## 2023-01-06 VITALS — BP 110/67 | HR 74 | Temp 97.5°F | Ht 67.0 in | Wt 268.0 lb

## 2023-01-06 DIAGNOSIS — F411 Generalized anxiety disorder: Secondary | ICD-10-CM

## 2023-01-06 DIAGNOSIS — A599 Trichomoniasis, unspecified: Secondary | ICD-10-CM | POA: Diagnosis not present

## 2023-01-06 DIAGNOSIS — Z113 Encounter for screening for infections with a predominantly sexual mode of transmission: Secondary | ICD-10-CM

## 2023-01-06 DIAGNOSIS — Z7251 High risk heterosexual behavior: Secondary | ICD-10-CM | POA: Insufficient documentation

## 2023-01-06 DIAGNOSIS — Z1329 Encounter for screening for other suspected endocrine disorder: Secondary | ICD-10-CM

## 2023-01-06 DIAGNOSIS — Z0000 Encounter for general adult medical examination without abnormal findings: Secondary | ICD-10-CM | POA: Insufficient documentation

## 2023-01-06 DIAGNOSIS — R1084 Generalized abdominal pain: Secondary | ICD-10-CM

## 2023-01-06 DIAGNOSIS — Z13220 Encounter for screening for lipoid disorders: Secondary | ICD-10-CM

## 2023-01-06 DIAGNOSIS — R6889 Other general symptoms and signs: Secondary | ICD-10-CM | POA: Diagnosis not present

## 2023-01-06 DIAGNOSIS — F331 Major depressive disorder, recurrent, moderate: Secondary | ICD-10-CM

## 2023-01-06 DIAGNOSIS — R7309 Other abnormal glucose: Secondary | ICD-10-CM | POA: Diagnosis not present

## 2023-01-06 DIAGNOSIS — Z0001 Encounter for general adult medical examination with abnormal findings: Secondary | ICD-10-CM

## 2023-01-06 DIAGNOSIS — Z131 Encounter for screening for diabetes mellitus: Secondary | ICD-10-CM | POA: Diagnosis not present

## 2023-01-06 DIAGNOSIS — Z130 Encounter for screening for diseases of the blood and blood-forming organs and certain disorders involving the immune mechanism: Secondary | ICD-10-CM | POA: Diagnosis not present

## 2023-01-06 LAB — URINALYSIS, ROUTINE W REFLEX MICROSCOPIC
Bilirubin, UA: NEGATIVE
Glucose, UA: NEGATIVE
Ketones, UA: NEGATIVE
Nitrite, UA: NEGATIVE
Protein,UA: NEGATIVE
RBC, UA: NEGATIVE
Specific Gravity, UA: 1.02 (ref 1.005–1.030)
Urobilinogen, Ur: 0.2 mg/dL (ref 0.2–1.0)
pH, UA: 6 (ref 5.0–7.5)

## 2023-01-06 LAB — BAYER DCA HB A1C WAIVED: HB A1C (BAYER DCA - WAIVED): 5.8 % — ABNORMAL HIGH (ref 4.8–5.6)

## 2023-01-06 LAB — MICROSCOPIC EXAMINATION
RBC, Urine: NONE SEEN /[HPF] (ref 0–2)
Renal Epithel, UA: NONE SEEN /[HPF]
Yeast, UA: NONE SEEN

## 2023-01-06 LAB — PREGNANCY, URINE: Preg Test, Ur: NEGATIVE

## 2023-01-06 LAB — LIPID PANEL

## 2023-01-06 NOTE — Progress Notes (Signed)
Complete physical exam  Patient: Tiffany [name]   DOB: 1991-01-20   32 y.o. Female  MRN: [id number]  Subjective:    Chief Complaint  Patient presents with   Annual Exam    Tiffany [name] is a 32 y.o. female who presents today for a complete physical exam. She reports consuming a general diet. The patient does not participate in regular exercise at present. She generally feels fairly well. She reports sleeping fairly well. She does have additional problems to discuss today.   Discussed the use of AI scribe software for clinical note transcription with the patient, who gave verbal consent to proceed.  History of Present Illness   The patient, with a history of depression and anxiety, presents for a complete physical exam. She also complains of chronic abdominal pain. She describes the pain as a burning sensation located in the midline and lower abdomen, which is constant and severe enough to take her breath away. The pain is exacerbated by lying on her side and is particularly intense during her menstrual cycle. She also reports a feeling of abdominal distention and tightness in her clothing, specifically in the abdominal area.  The patient has a history of tubal removal, and the pain was previously attributed to scar tissue. However, she expresses dissatisfaction with this explanation due to the severity of the pain. She also reports a history of fluid accumulation in the abdominal area.  In terms of her mental health, the patient reports worsening depression and anxiety, which she attributes to being in a new relationship. She has been prescribed Celexa and Trazodone, but she stopped taking these medications after a few days due to feeling sluggish and unlike herself. She has not yet seen a psychiatrist, despite a referral.  The patient also reports a history of unprotected sexual activity. She believes she is unable to become pregnant due to her tubal removal, but she is not on any form of  birth control. She denies any symptoms of sexually transmitted infections or pregnancy.  The patient's other medications include an unspecified medication that she stopped taking because it made her feel better. She has not been taking any medications at the time of the consultation.      Most recent fall risk assessment:    11/03/2022    8:27 AM  Fall Risk   Falls in the past year? 0     Most recent depression screenings:    01/06/2023    2:37 PM 11/03/2022    8:27 AM 05/15/2022    3:18 PM 04/30/2022    3:50 PM 06/12/2021    3:05 PM  Depression screen PHQ 2/9  Decreased Interest 3 2 2 1 3   Down, Depressed, Hopeless 3 2 2 1 2   PHQ - 2 Score 6 4 4 2 5   Altered sleeping 3 0 3 1 3   Tired, decreased energy 3 2 2 2 3   Change in appetite 3 0 2 2 3   Feeling bad or failure about yourself  0 0 2 1 2   Trouble concentrating 2 0 0 0 0  Moving slowly or fidgety/restless 1 0 0 0 2  Suicidal thoughts 0 0 0 0 0  PHQ-9 Score 18 6 13 8 18   Difficult doing work/chores Very difficult Somewhat difficult   Very difficult      01/06/2023    2:38 PM 11/03/2022    8:28 AM 05/15/2022    3:18 PM 04/30/2022    3:51 PM  GAD 7 :  Generalized Anxiety Score  Nervous, Anxious, on Edge 2 0 2 1  Control/stop worrying 3 3 3 1   Worry too much - different things 3 3 3 1   Trouble relaxing 3 0 2 1  Restless 0 0 0 2  Easily annoyed or irritable 3 3 3 1   Afraid - awful might happen 0 0 2 3  Total GAD 7 Score 14 9 15 10   Anxiety Difficulty  Not difficult at all        Vision:Within last year and Dental: No current dental problems and Receives regular dental care  Patient Active Problem List   Diagnosis Date Noted   Morbid obesity (HCC) 05/15/2022   Generalized anxiety disorder 11/01/2020   Herpes zoster without complication 07/30/2020   Difficulty sleeping 06/19/2020   Seasonal allergies 06/19/2020   MDD (major depressive disorder), recurrent episode, moderate (HCC) 10/12/2018   Chronic right-sided low  back pain with right-sided sciatica 10/12/2018   Anxiety 10/05/2017   Past Medical History:  Diagnosis Date   Anxiety    Chronic back pain    Depression    Vaginal Pap smear, abnormal    Past Surgical History:  Procedure Laterality Date   EXTERNAL EAR SURGERY Left    LAPAROSCOPIC BILATERAL SALPINGECTOMY Bilateral 02/21/2020   Procedure: LAPAROSCOPIC BILATERAL TUBAL LIGATION, BILATERAL SALPINGECTOMY TECHNIQUE;  Surgeon: Lazaro Arms, MD;  Location: [facility];  Service: Gynecology;  Laterality: Bilateral;   LEG SURGERY Left    Social History   Tobacco Use   Smoking status: Former    Current packs/day: 0.00    Types: Cigarettes    Quit date: 03/2022    Years since quitting: 0.7   Smokeless tobacco: Never  Vaping Use   Vaping status: Never Used  Substance Use Topics   Alcohol use: Yes    Comment: occ   Drug use: Not Currently    Types: Marijuana    Comment: used in the past   Social History   Socioeconomic History   Marital status: Single    Spouse name: Not on file   Number of children: 2   Years of education: Not on file   Highest education level: Not on file  Occupational History   Not on file  Tobacco Use   Smoking status: Former    Current packs/day: 0.00    Types: Cigarettes    Quit date: 03/2022    Years since quitting: 0.7   Smokeless tobacco: Never  Vaping Use   Vaping status: Never Used  Substance and Sexual Activity   Alcohol use: Yes    Comment: occ   Drug use: Not Currently    Types: Marijuana    Comment: used in the past   Sexual activity: Yes    Birth control/protection: Surgical, Condom  Other Topics Concern   Not on file  Social History Narrative   Not on file   Social Determinants of Health   Financial Resource Strain: Not on file  Food Insecurity: Not on file  Transportation Needs: Not on file  Physical Activity: Not on file  Stress: Not on file  Social Connections: Not on file  Intimate Partner Violence: Not on file   Family  Status  Relation Name Status   Mother  Alive   Father  Alive   Sister  Alive   Brother  Alive   Son  Alive   Brother  Alive   Sister  Alive   Sister  Alive   PGF  Alive  PGM  Alive   MGM  Alive   MGF  Alive   Sister  Alive   Son  Alive  No partnership data on file   Family History  Problem Relation Age of Onset   Depression Mother    Asthma Sister    Asthma Sister    Depression Maternal Grandmother    Hypertension Maternal Grandfather    Other Maternal Grandfather        blood clots   Allergies  Allergen Reactions   Latex Rash   Other Itching and Swelling    Coconut      Patient Care Team: Sonny Masters, FNP as PCP - General (Family Medicine) Adline Potter, NP as Nurse Practitioner (Obstetrics and Gynecology)   Outpatient Medications Prior to Visit  Medication Sig   citalopram (CELEXA) 40 MG tablet Take 1 tablet (40 mg total) by mouth daily.   traZODone (DESYREL) 50 MG tablet 25-50 mg at night for insomnia   [DISCONTINUED] meloxicam (MOBIC) 15 MG tablet Take 1 tablet (15 mg total) by mouth daily. (Patient not taking: Reported on 11/03/2022)   No facility-administered medications prior to visit.   ROS per HPI, otherwise negative      Objective:     BP 110/67   Pulse 74   Temp (!) 97.5 F (36.4 C) (Temporal)   Ht 5\' 7"  (1.702 m)   Wt 268 lb (121.6 kg)   SpO2 97%   BMI 41.97 kg/m  BP Readings from Last 3 Encounters:  01/06/23 110/67  11/03/22 129/85  05/15/22 116/74   Wt Readings from Last 3 Encounters:  01/06/23 268 lb (121.6 kg)  11/03/22 264 lb (119.7 kg)  05/15/22 264 lb 6.4 oz (119.9 kg)   SpO2 Readings from Last 3 Encounters:  01/06/23 97%  11/03/22 100%  05/15/22 98%      Physical Exam Vitals and nursing note reviewed.  Constitutional:      General: She is not in acute distress.    Appearance: Normal appearance. She is well-developed and well-groomed. She is morbidly obese. She is not ill-appearing, toxic-appearing or  diaphoretic.  HENT:     Head: Normocephalic and atraumatic.     Jaw: There is normal jaw occlusion.     Right Ear: Hearing, tympanic membrane, ear canal and external ear normal.     Left Ear: Hearing, tympanic membrane, ear canal and external ear normal.     Nose: Nose normal.     Mouth/Throat:     Lips: Pink.     Mouth: Mucous membranes are moist.     Pharynx: Oropharynx is clear. Uvula midline.  Eyes:     General: Lids are normal.     Extraocular Movements: Extraocular movements intact.     Conjunctiva/sclera: Conjunctivae normal.     Pupils: Pupils are equal, round, and reactive to light.  Neck:     Thyroid: No thyroid mass, thyromegaly or thyroid tenderness.     Vascular: No carotid bruit or JVD.     Trachea: Trachea and phonation normal.  Cardiovascular:     Rate and Rhythm: Normal rate and regular rhythm.     Chest Wall: PMI is not displaced.     Pulses: Normal pulses.     Heart sounds: Normal heart sounds. No murmur heard.    No friction rub. No gallop.  Pulmonary:     Effort: Pulmonary effort is normal. No respiratory distress.     Breath sounds: Normal breath sounds. No wheezing.  Abdominal:  General: Abdomen is protuberant. Bowel sounds are normal. There is no distension or abdominal bruit.     Palpations: Abdomen is soft. There is no hepatomegaly or splenomegaly.     Tenderness: There is generalized abdominal tenderness. There is no right CVA tenderness, left CVA tenderness, guarding or rebound. Negative signs include Murphy's sign, Rovsing's sign, McBurney's sign, psoas sign and obturator sign.     Hernia: No hernia is present.  Musculoskeletal:        General: Normal range of motion.     Cervical back: Normal range of motion and neck supple.     Right lower leg: No edema.     Left lower leg: No edema.  Lymphadenopathy:     Cervical: No cervical adenopathy.  Skin:    General: Skin is warm and dry.     Capillary Refill: Capillary refill takes less than 2  seconds.     Coloration: Skin is not cyanotic, jaundiced or pale.     Findings: No rash.  Neurological:     General: No focal deficit present.     Mental Status: She is alert and oriented to person, place, and time.     Sensory: Sensation is intact.     Motor: Motor function is intact.     Coordination: Coordination is intact.     Gait: Gait is intact.     Deep Tendon Reflexes: Reflexes are normal and symmetric.  Psychiatric:        Attention and Perception: Attention and perception normal.        Mood and Affect: Mood is depressed. Affect is flat.        Speech: Speech normal.        Behavior: Behavior normal. Behavior is cooperative.        Thought Content: Thought content normal.        Cognition and Memory: Cognition and memory normal.        Judgment: Judgment normal.      Last CBC Lab Results  Component Value Date   WBC 7.4 05/15/2022   HGB 13.0 05/15/2022   HCT 40.4 05/15/2022   MCV 81 05/15/2022   MCH 26.1 (L) 05/15/2022   RDW 14.3 05/15/2022   PLT 400 05/15/2022   Last metabolic panel Lab Results  Component Value Date   GLUCOSE 114 (H) 05/15/2022   NA 140 05/15/2022   K 4.1 05/15/2022   CL 103 05/15/2022   CO2 24 05/15/2022   BUN 12 05/15/2022   CREATININE 0.91 05/15/2022   EGFR 86 05/15/2022   CALCIUM 9.5 05/15/2022   PROT 6.5 05/15/2022   ALBUMIN 4.3 05/15/2022   LABGLOB 2.2 05/15/2022   AGRATIO 2.0 05/15/2022   BILITOT 0.3 05/15/2022   ALKPHOS 67 05/15/2022   AST 12 05/15/2022   ALT 11 05/15/2022   ANIONGAP 8 02/05/2020   Last lipids Lab Results  Component Value Date   CHOL 154 06/19/2020   HDL 66 06/19/2020   LDLCALC 75 06/19/2020   TRIG 66 06/19/2020   CHOLHDL 2.3 06/19/2020   Last hemoglobin A1c Lab Results  Component Value Date   HGBA1C 6.1 (H) 05/15/2022   Last thyroid functions Lab Results  Component Value Date   TSH 0.987 05/15/2022   T4TOTAL 6.8 05/15/2022   Last vitamin D Lab Results  Component Value Date   VD25OH 21.3  (L) 05/15/2022   Last vitamin B12 and Folate Lab Results  Component Value Date   VITAMINB12 567 03/26/2020   FOLATE 5.6  03/26/2020        Assessment & Plan:    Routine Health Maintenance and Physical Exam  Immunization History  Administered Date(s) Administered   MMR 08/11/2014   Moderna SARS-COV2 Booster Vaccination 01/29/2020   Moderna Sars-Covid-2 Vaccination 05/24/2019, 06/21/2019   PPD Test 01/06/2021   Tdap 08/10/2014    Health Maintenance  Topic Date Due   COVID-19 Vaccine (4 - 2023-24 season) 01/22/2023 (Originally 09/27/2022)   INFLUENZA VACCINE  04/26/2023 (Originally 08/27/2022)   Hepatitis C Screening  05/15/2023 (Originally 07/01/2008)   Cervical Cancer Screening (HPV/Pap Cotest)  06/20/2023   DTaP/Tdap/Td (2 - Td or Tdap) 08/09/2024   HIV Screening  Completed   HPV VACCINES  Aged Out    Discussed health benefits of physical activity, and encouraged her to engage in regular exercise appropriate for her age and condition.  Problem List Items Addressed This Visit       Other   MDD (major depressive disorder), recurrent episode, moderate (HCC)   Relevant Orders   Thyroid Panel With TSH   Ambulatory referral to Psychiatry   Generalized anxiety disorder   Relevant Orders   Thyroid Panel With TSH   Ambulatory referral to Psychiatry   Other Visit Diagnoses     Annual physical exam    -  Primary   Relevant Orders   Anemia Profile B   CMP14+EGFR   HepB+HepC+HIV Panel   RPR   Urine cytology ancillary only   Thyroid Panel With TSH   Lipid panel   Bayer DCA Hb A1c Waived   Pregnancy, urine   Screening for lipid disorders       Relevant Orders   Lipid panel   Screening for endocrine disorder       Relevant Orders   CMP14+EGFR   Thyroid Panel With TSH   Screening for diabetes mellitus       Relevant Orders   CMP14+EGFR   Bayer DCA Hb A1c Waived   Screening for thyroid disorder       Relevant Orders   Thyroid Panel With TSH   Screening for  deficiency anemia       Relevant Orders   Anemia Profile B   Screening examination for STI       Relevant Orders   HepB+HepC+HIV Panel   RPR   Urine cytology ancillary only   History of unprotected sex       Relevant Orders   HepB+HepC+HIV Panel   RPR   Urine cytology ancillary only   Pregnancy, urine   Generalized abdominal pain       Relevant Orders   CT ABDOMEN PELVIS W CONTRAST   Urinalysis, Routine w reflex microscopic   Urine Culture      Assessment and Plan    Abdominal Pain Severe, constant burning abdominal pain, exacerbated during menstruation, located midline and lower abdomen. Tubal ligation in 2022 with previous imaging showing fluid accumulation. Differential diagnosis includes scar tissue adhesions and other abdominal pathology. Discussed potential scar tissue adhesions and need for imaging to rule out other causes. - Order CT scan of the abdomen - Perform lab work including liver enzymes and white count - Conduct STI screening - Perform pregnancy test  Depression and Anxiety Worsening depression and anxiety, particularly since entering a new relationship. Discontinued Celexa and trazodone due to feeling sluggish. Did not allow medications the recommended 6-8 weeks to stabilize. No thoughts of self-harm or harm to others. Discussed initial side effects typically subside after the first few weeks  and importance of giving medications time to work. - Refer to psychiatry for follow-up - Encourage restarting Celexa and trazodone and give her 6-8 weeks to assess efficacy  General Health Maintenance Routine physical examination. Reports tightness in scrubs, indicating possible weight gain. No changes in hearing or vision. Next eye doctor appointment scheduled for next year. - Perform routine lab work for physical examination - Encourage follow-up with eye doctor next year  Follow-up - Follow-up with psychiatrist after referral - Review lab and imaging results at  next visit.       Return in about 1 year (around 01/06/2024) for CPE.     Kari Baars, FNP

## 2023-01-07 LAB — ANEMIA PROFILE B
Basophils Absolute: 0 10*3/uL (ref 0.0–0.2)
Basos: 1 %
EOS (ABSOLUTE): 0.5 10*3/uL — ABNORMAL HIGH (ref 0.0–0.4)
Eos: 7 %
Ferritin: 97 ng/mL (ref 15–150)
Folate: 4.6 ng/mL (ref >3.0)
Hematocrit: 41.1 % (ref 34.0–46.6)
Hemoglobin: 13.2 g/dL (ref 11.1–15.9)
Immature Grans (Abs): 0 10*3/uL (ref 0.0–0.1)
Immature Granulocytes: 0 %
Iron Saturation: 16 % (ref 15–55)
Iron: 51 ug/dL (ref 27–159)
Lymphocytes Absolute: 2.8 10*3/uL (ref 0.7–3.1)
Lymphs: 41 %
MCH: 26.2 pg — ABNORMAL LOW (ref 26.6–33.0)
MCHC: 32.1 g/dL (ref 31.5–35.7)
MCV: 82 fL (ref 79–97)
Monocytes Absolute: 0.3 10*3/uL (ref 0.1–0.9)
Monocytes: 5 %
Neutrophils Absolute: 3.2 10*3/uL (ref 1.4–7.0)
Neutrophils: 46 %
Platelets: 410 10*3/uL (ref 150–450)
RBC: 5.03 x10E6/uL (ref 3.77–5.28)
RDW: 14.1 % (ref 11.7–15.4)
Retic Ct Pct: 1.1 % (ref 0.6–2.6)
Total Iron Binding Capacity: 329 ug/dL (ref 250–450)
UIBC: 278 ug/dL (ref 131–425)
Vitamin B-12: 617 pg/mL (ref 232–1245)
WBC: 6.9 10*3/uL (ref 3.4–10.8)

## 2023-01-07 LAB — CMP14+EGFR
ALT: 12 IU/L (ref 0–32)
AST: 11 IU/L (ref 0–40)
Albumin: 4.3 g/dL (ref 3.9–4.9)
Alkaline Phosphatase: 70 IU/L (ref 44–121)
BUN/Creatinine Ratio: 15 (ref 9–23)
BUN: 12 mg/dL (ref 6–20)
CO2: 22 mmol/L (ref 20–29)
Calcium: 10 mg/dL (ref 8.7–10.2)
Chloride: 104 mmol/L (ref 96–106)
Creatinine, Ser: 0.82 mg/dL (ref 0.57–1.00)
Globulin, Total: 2.5 g/dL (ref 1.5–4.5)
Glucose: 77 mg/dL (ref 70–99)
Potassium: 4.6 mmol/L (ref 3.5–5.2)
Sodium: 143 mmol/L (ref 134–144)
Total Protein: 6.8 g/dL (ref 6.0–8.5)
eGFR: 97 mL/min/{1.73_m2} (ref >59)

## 2023-01-07 LAB — LIPID PANEL
Cholesterol, Total: 137 mg/dL (ref 100–199)
HDL: 57 mg/dL (ref >39)
LDL CALC COMMENT:: 2.4 ratio (ref 0.0–4.4)
LDL Chol Calc (NIH): 69 mg/dL (ref 0–99)
Triglycerides: 49 mg/dL (ref 0–149)
VLDL Cholesterol Cal: 11 mg/dL (ref 5–40)

## 2023-01-07 LAB — HEPB+HEPC+HIV PANEL

## 2023-01-07 LAB — RPR

## 2023-01-07 LAB — THYROID PANEL WITH TSH
Free Thyroxine Index: 2 (ref 1.2–4.9)
T3 Uptake Ratio: 24 % (ref 24–39)
T4, Total: 8.4 ug/dL (ref 4.5–12.0)
TSH: 1.77 u[IU]/mL (ref 0.450–4.500)

## 2023-01-08 ENCOUNTER — Encounter: Payer: Self-pay | Admitting: Family Medicine

## 2023-01-08 LAB — URINE CULTURE

## 2023-01-08 LAB — URINE CYTOLOGY ANCILLARY ONLY
Chlamydia: NEGATIVE
Comment: NEGATIVE
Comment: NEGATIVE
Comment: NORMAL
Neisseria Gonorrhea: NEGATIVE
Trichomonas: POSITIVE — AB

## 2023-01-08 MED ORDER — METRONIDAZOLE 500 MG PO TABS: 500.0000 mg | ORAL_TABLET | Freq: Two times a day (BID) | ORAL | 0 refills | Status: DC

## 2023-01-08 NOTE — Addendum Note (Signed)
Addended by: Sonny Masters on: 01/08/2023 12:41 PM   Modules accepted: Orders

## 2023-02-02 ENCOUNTER — Ambulatory Visit (HOSPITAL_COMMUNITY)
Admission: RE | Admit: 2023-02-02 | Discharge: 2023-02-02 | Disposition: A | Discharge status: Home / Self Care | Payer: Medicaid Other | Source: Ambulatory Visit | Attending: Family Medicine | Admitting: Family Medicine

## 2023-02-02 DIAGNOSIS — R1084 Generalized abdominal pain: Secondary | ICD-10-CM | POA: Insufficient documentation

## 2023-02-02 DIAGNOSIS — R109 Unspecified abdominal pain: Secondary | ICD-10-CM | POA: Diagnosis not present

## 2023-02-02 DIAGNOSIS — D259 Leiomyoma of uterus, unspecified: Secondary | ICD-10-CM | POA: Diagnosis not present

## 2023-02-02 MED ORDER — IOHEXOL 300 MG/ML  SOLN
100.0000 mL | Freq: Once | INTRAMUSCULAR | Status: AC | PRN
Start: 2023-02-02 — End: 2023-02-02
  Administered 2023-02-02: 100 mL via INTRAVENOUS

## 2023-02-02 MED ORDER — IOHEXOL 300 MG/ML  SOLN: 100.0000 mL | Freq: Once | INTRAMUSCULAR | Status: DC | PRN

## 2023-02-09 ENCOUNTER — Other Ambulatory Visit (HOSPITAL_COMMUNITY): Payer: Self-pay | Admitting: Family Medicine

## 2023-02-09 DIAGNOSIS — N858 Other specified noninflammatory disorders of uterus: Secondary | ICD-10-CM

## 2023-02-11 ENCOUNTER — Telehealth (HOSPITAL_COMMUNITY): Payer: Self-pay

## 2023-02-11 NOTE — Telephone Encounter (Signed)
Lvm to confirm 02/15/23 appt by 12:00 pm 02/12/23

## 2023-02-12 NOTE — Telephone Encounter (Signed)
Appt confirmed by pt.

## 2023-02-15 ENCOUNTER — Ambulatory Visit (INDEPENDENT_AMBULATORY_CARE_PROVIDER_SITE_OTHER): Payer: Medicaid Other | Admitting: Registered Nurse

## 2023-02-15 ENCOUNTER — Encounter (HOSPITAL_COMMUNITY): Payer: Self-pay | Admitting: Registered Nurse

## 2023-02-15 VITALS — BP 114/79 | HR 80 | Ht 67.0 in | Wt 265.6 lb

## 2023-02-15 DIAGNOSIS — G4700 Insomnia, unspecified: Secondary | ICD-10-CM

## 2023-02-15 DIAGNOSIS — F411 Generalized anxiety disorder: Secondary | ICD-10-CM

## 2023-02-15 DIAGNOSIS — F1290 Cannabis use, unspecified, uncomplicated: Secondary | ICD-10-CM

## 2023-02-15 DIAGNOSIS — F331 Major depressive disorder, recurrent, moderate: Secondary | ICD-10-CM

## 2023-02-15 MED ORDER — SERTRALINE HCL 25 MG PO TABS: 25.0000 mg | ORAL_TABLET | Freq: Every day | ORAL | 1 refills | Status: DC

## 2023-02-15 NOTE — Progress Notes (Signed)
Psychiatric Initial Adult Assessment   Patient Identification: Tiffany [name]. 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 MDD (major depressive disorder), recurrent episode, moderate (HCC)  F33.1 sertraline (ZOLOFT) 25 MG tablet    3. Insomnia, unspecified type  G47.00      [name] of Present Illness:  Tiffany [name] 33 y/o [name] who presents to office today to establish care for medication management.  She is seen face to face by this provider, and chart reviewed on 02/15/23.  Her psychiatric [name] includes general [name] disorder, major depressive disorder recurrent, moderate, and insomnia unspecified.  She [name] she is not currently taking any medications to manage her mental health but has tried Cymbalta in the past "But I stopped taking it because it made me feel zombified.  She does report that she is taking Trazodone for sleep and it is prescribed by her primary care doctor.    Tiffany [name].  She [name] her first episode of depression occurred after the birth of her second child in 2016.  She [name] that her [name] and related stressors are being unemployed, "not being where I want in life, and my boyfriend."  She states her boyfriend has a diagnosis of "schizophrenia and bipolar or what ever he calls it but he believes I'm talking to the man name Tiffany [name] that works with him and I don't know a Tiffany [name]."  She feels it is a delusion or paranoia related to his mental health.  She states he is not a danger to her or her children "he is the nicest person I know."  Patient denies a [name] of psychiatric hospitalization, self-harming behaviors, and suicide  attempt.  She states she has mead a suicidal gesture "about a year ago I had a knife and was thinking about stabbing myself.  I didn't really want to die I was just stressed and overwhelmed."  She states that her son stopped her.  "I live for my kids and they are why I won't kill myself."  Patient states she has a large family "I have 2 sisters by my mother and 2 sisters and 2 brothers by my father."  She [name] that she is close to her family and that her family are very supportive.  [name] her primary supports comes from her mother, maternal sisters, her 2 sons, and her maternal grandparents.  At this time patient [name] that her [name] is worse than her depression related to not having a job and "just having to look for work."  She states her depression is manageable.  She denies fluctuations in mood, abnormal movement, suicidal/self-harm/homicidal ideation, psychosis, and paranoia.  AIMS, PHQ 2 & 9, C-SSRS, and GAD 7 screenings conducted by provider see scores below. Patient [name] she is currently unemployed.  Waiting her job around the first week or January 2025 after an altercation with her supervisor.  She states she lives with her 2 sons.  Patient also states that she smokes marijuana daily.  "It helps me relax."  She states she smokes about 2-3 blunts daily.  Education discussed about marijuana and the affects to mental health.  Understanding voiced by patient.  She [name] she has no desire to quit smoking marijuana at this time.  She [name] eating and sleeping without difficulty Recommended starting Zoloft 25 mg daily for depression and [name].  Informed of side effect/efficacy profile and that it would take a couple of weeks before notable improvements would be seen.  She [name] understanding of information being communicated to her today and is agreeable to recommendations.  Medication will be sent to pharmacy of her choice.    Chart Review:   Labs:  01/06/2023:  Lipid panel, TSH, CMP,  Urinalysis, urine culture, pregnancy, anemia profile, RPR, Hep B/C+HIV No noted EKG   Associated Signs/Symptoms: Depression Symptoms:  depressed mood, hypersomnia, fatigue, difficulty concentrating, hopelessness, [name], (Hypo) Manic Symptoms:   Denies [name] Symptoms:  Excessive Worry, Panic Symptoms, Psychotic Symptoms:   Denies PTSD Symptoms: Denies  Past Psychiatric [name]: GAD,MDD, insomnia  Previous Psychotropic Medications: Yes , Celexa stopped because made her feel like a zombie  Substance Abuse [name] in the last 12 months:  Yes.    Consequences of Substance Abuse: Denies any consequences  Past Medical [name]:  Past Medical [name]:  Diagnosis Date   [name]    Chronic back pain    Depression    Vaginal Pap smear, abnormal     Past Surgical [name]:  Procedure Laterality Date   EXTERNAL EAR SURGERY Left    LAPAROSCOPIC BILATERAL SALPINGECTOMY Bilateral 02/21/2020   Procedure: LAPAROSCOPIC BILATERAL TUBAL LIGATION, BILATERAL SALPINGECTOMY TECHNIQUE;  Surgeon: Lazaro Arms, MD;  Location: [facility];  Service: Gynecology;  Laterality: Bilateral;   LEG SURGERY Left     Family Psychiatric [name]: Mother and maternal grandmother Depression  Family [name]:  Family [name]  Problem Relation Age of Onset   Depression Mother    Asthma Sister    Asthma Sister    Depression Maternal Grandmother    Hypertension Maternal Grandfather    Other Maternal Grandfather        blood clots    Social [name]:   Social [name]   Socioeconomic [name]   Marital status: Single    Spouse name: Not on file   Number of children: 2   Years of education: Not on file   Highest education level: Not on file  Occupational [name]   Not on file  Tobacco Use   Smoking status: Former    Current packs/day: 0.00    Types: Cigarettes    Quit date: 03/2022    Years since quitting: 0.8   Smokeless tobacco: Never  Vaping Use   Vaping status: Never Used  Substance and  Sexual Activity   Alcohol use: Yes    Comment: occ   Drug use: Not Currently    Types: Marijuana    Comment: used in the past   Sexual activity: Yes    Birth control/protection: Surgical, Condom  Other Topics Concern   Not on file  Social [name] Narrative   Not on file   Social Drivers of Health   Financial Resource Strain: Not on file  Food Insecurity: Not on file  Transportation Needs: Not on file  Physical Activity: Not on file  Stress: Not on file  Social Connections: Not on file    Additional Social [name]: Lives with her 2 sons ages 45 and 44 y/o  Allergies:   Allergies  Allergen Reactions   Latex Rash   Other Itching and Swelling    Coconut    Metabolic Disorder Labs: Lab Results  Component Value Date   HGBA1C 5.8 (  H) 01/06/2023   No results found for: "PROLACTIN" Lab Results  Component Value Date   CHOL 137 01/06/2023   TRIG 49 01/06/2023   HDL 57 01/06/2023   CHOLHDL 2.4 01/06/2023   LDLCALC 69 01/06/2023   LDLCALC 75 06/19/2020   Lab Results  Component Value Date   TSH 1.770 01/06/2023    Therapeutic Level Labs: No results found for: "LITHIUM" No results found for: "CBMZ" No results found for: "VALPROATE"  Current Medications: Current Outpatient Medications  Medication Sig Dispense Refill   metroNIDAZOLE (FLAGYL) 500 MG tablet Take 1 tablet (500 mg total) by mouth 2 (two) times daily. 14 tablet 0   traZODone (DESYREL) 50 MG tablet 25-50 mg at night for insomnia 90 tablet 3   sertraline (ZOLOFT) 25 MG tablet Take 1 tablet (25 mg total) by mouth daily. 30 tablet 1   No current facility-administered medications for this visit.    Musculoskeletal: Strength & Muscle Tone: within normal limits Gait & Station: normal Patient leans: N/A  Psychiatric Specialty Exam: Review of Systems  Constitutional:        No other complaints voiced  Gastrointestinal:  Positive for abdominal pain (Chronic.  PCP aware and has been referred to GI).   Musculoskeletal:  Positive for back pain (acute.  Thinks she may have pulled a musle yesterday).  Psychiatric/Behavioral:  Positive for dysphoric mood and sleep disturbance (Daily use of marijuana). Negative for agitation, behavioral problems, confusion, hallucinations, self-injury and suicidal ideas. Decreased concentration: Stable.The patient is nervous/anxious. The patient is not hyperactive.   All other systems reviewed and are negative.   Blood pressure 114/79, pulse 80, height 5\' 7"  (1.702 m), weight 265 lb 9.6 oz (120.5 kg), SpO2 98%.Body mass index is 41.6 kg/m.  General Appearance: Casual and Neat  Eye Contact:  Good  Speech:  Clear and Coherent and Normal Rate  Volume:  Normal  Mood:  Euthymic  Affect:  Appropriate and Congruent  Thought Process:  Coherent, Goal Directed, and Descriptions of Associations: Intact  Orientation:  Full (Time, Place, and Person)  Thought Content:  WDL and Logical  Suicidal Thoughts:  No  Homicidal Thoughts:  No  Memory:  Immediate;   Good Recent;   Good Remote;   Good  Judgement:  Intact  Insight:  Good and Present  Psychomotor Activity:  Normal  Concentration:  Concentration: Good and Attention Span: Good  Recall:  Good  Fund of Knowledge:Good  Language: Good  Akathisia:  No  Handed:  Right  AIMS (if indicated):  done  Assets:  Communication Skills Desire for Improvement Housing Intimacy Leisure Time Physical Health Resilience Social Support Transportation  ADL's:  Intact  Cognition: WNL  Sleep:  Good   Screenings: AIMS    Loss adjuster, chartered Office Visit from 02/15/2023 in [facility] [place]  AIMS Total Score 0      GAD-7    Flowsheet Row Office Visit from 02/15/2023 in [facility] [place] Office Visit from 01/06/2023 in [facility] Office Visit from 11/03/2022 in [facility] Office Visit  from 05/15/2022 in [facility] Office Visit from 04/30/2022 in [facility]  Total GAD-7 Score 11 14 9 15 10       PHQ2-9    Flowsheet Row Office Visit from 02/15/2023 in [facility] [place] Office Visit from 01/06/2023 in [facility] Office Visit from 11/03/2022 in [facility] Office Visit from 05/15/2022 in [facility] Office Visit from 04/30/2022 in [facility]  PHQ-2 Total Score 6 6 4 4 2   PHQ-9 Total Score 18 18 6 13 8       Flowsheet Row ED from 12/10/2021 in [facility] at [facility] ED from 04/28/2021 in [facility] Department at [facility] Admission (Discharged) from 02/21/2020 in [facility]  C-SSRS RISK CATEGORY No Risk No Risk No Risk       Assessment and Plan: Aniyjah Laurance. Langford with a past psychiatric [name] significant for general [name], major depression, and insomnia presented to [facility] to day for initial intake to establish care for medication management. She appears to be doing well.  She [name] no issues or concerns regarding Trazodone that is prescribed by her primary provider for sleep.  Recommended starting Zoloft 25 mg daily for depression and [name].  Patient agreed to recommendation.  She is dressed appropriate for weather and age.  She is sitting upright in chair with no noted distress.  She is alert/oriented x 4, pleasant, calm/cooperative and mood is congruent with affect.  She spoke in a clear tone at moderate volume, and normal pace, with good eye contact throughout assessment.  She engaged in assessment conversation, her responses were relevant, and appropriate to assessment questions.  There is no indication that she is currently responding to  internal/external stimuli or experiencing delusional thought content.  She denies suicidal/self-harm/homicidal ideation, psychosis, paranoia, fluctuations in mood, and abnormal movements.   Patient's medications to be e-prescribed to pharmacy of choice.  1. Generalized [name] disorder (Primary) - sertraline (ZOLOFT) 25 MG tablet; Take 1 tablet (25 mg total) by mouth daily.  Dispense: 30 tablet; Refill: 1  2. MDD (major depressive disorder), recurrent episode, moderate (HCC) - sertraline (ZOLOFT) 25 MG tablet; Take 1 tablet (25 mg total) by mouth daily.  Dispense: 30 tablet; Refill: 1  3. Insomnia, unspecified type  4. Marijuana use, continuous   Follow up in 2 weeks for medication management  Collaboration of Care: Medication Management AEB Starting Zoloft 25 mg daily and Referral or follow-up with counselor/therapist AEB referral to out patient counseling    Patient/Guardian was advised Release of Information must be obtained prior to any record release in order to collaborate their care with an outside provider. Patient/Guardian was advised if they have not already done so to contact the registration department to sign all necessary forms in order for [place] to release information regarding their care.   Consent: Patient/Guardian gives verbal consent for treatment and assignment of benefits for services provided during this visit. Patient/Guardian expressed understanding and agreed to proceed.   Lakita Sahlin, NP 1/20/202511:17 AM

## 2023-02-15 NOTE — Patient Instructions (Signed)
Call 911, mobile crisis, or present to the nearest emergency room should you experience any suicidal/homicidal ideation, auditory/visual/hallucinations, or detrimental worsening of your mental health.  Mobile Crisis Response Teams Listed by counties in vicinity of [facility] providers [facility] [phone] [facility] [phone] [facility] [phone] [facility] [phone] [place]                * [facility] [phone]                * [facility] [phone]  [facility] [phone] [facility] [employer]  [phone] * [employer] [phone]

## 2023-03-01 ENCOUNTER — Telehealth (INDEPENDENT_AMBULATORY_CARE_PROVIDER_SITE_OTHER): Payer: Medicaid Other | Admitting: Registered Nurse

## 2023-03-01 ENCOUNTER — Encounter (HOSPITAL_COMMUNITY): Payer: Self-pay | Admitting: Registered Nurse

## 2023-03-01 DIAGNOSIS — F411 Generalized anxiety disorder: Secondary | ICD-10-CM

## 2023-03-01 DIAGNOSIS — Z20822 Contact with and (suspected) exposure to covid-19: Secondary | ICD-10-CM | POA: Diagnosis not present

## 2023-03-01 DIAGNOSIS — G4700 Insomnia, unspecified: Secondary | ICD-10-CM

## 2023-03-01 DIAGNOSIS — F331 Major depressive disorder, recurrent, moderate: Secondary | ICD-10-CM

## 2023-03-01 DIAGNOSIS — J101 Influenza due to other identified influenza virus with other respiratory manifestations: Secondary | ICD-10-CM | POA: Diagnosis not present

## 2023-03-01 MED ORDER — SERTRALINE HCL 25 MG PO TABS: 25.0000 mg | ORAL_TABLET | Freq: Every day | ORAL | 0 refills | Status: DC

## 2023-03-01 NOTE — Patient Instructions (Addendum)
Online search at Psychology Today.  Enter the type of therapist looking for and the area you are in and should pull up therapist in your area.    Call 911, mobile crisis, or present to the nearest emergency room should you experience any suicidal/homicidal ideation, auditory/visual/hallucinations, or detrimental worsening of your mental health.  Mobile Crisis Response Teams Listed by counties in vicinity of [facility] providers [facility] [phone] [facility] [phone] [facility] [phone] [facility] [phone] [place]                * [facility] [phone]                * [facility] [phone]  [facility] [phone] [facility] [employer]  [phone] * [facility] [phone]     Online search at Psychology Today.  Enter the type of therapist you are looking for, The area you are in, and your insurance type,  A list of therapist in your area accepting your insurance should pull up.      Below are a Clinical research associate (counselors) found on [email or web address] that accepts Medicaid:  [phone] Pryor Ochoa Counselor, [facility] Verified 1 Endorsed Online Only Waitlist for new clients Hey there, thanks for giving my page a look! I'm Eli, a Licensed Engineer, petroleum affiliated with [employer]. Mental health care is difficult to find for anyone, and once you find a therapist you might like, that's only the first step in this scary process right? I've worked with people from all walks of life, ages and genders; from families, to couples and individuals dealing with depression, anxiety, LGBTQ+/identity issues, behavioral struggles, trauma, relationship struggles, and more. I also have experience  within the [employer] and am familiar with [employer] struggles.  [phone] [facility] Licensed Professional Counselor, [facility] [place], [place], CRC Verified [place], [place] [address] Therapist provides office, In-home, & community based counseling alternatives to traditional therapy. Therapist offers creative, out of the box, & innovative solutions utilizing several evidence-based approaches to every day issues. Therapist uses strength-based approaches to equip & empower their clients to make sustainable changes. [facility] builds off of current supports, identifies new ones, and partners with those they serve. Therapist believe in use of, honesty, encouragement, practical life application, community resources, and partnership. [facility] is the place where positive thinking and behavior changes meet.  [phone] Aimee Vincente Poli Clinical Social Work/Therapist, LCSW Verified 2 Endorsed Serves Area Whether I am working with a young child or an adult, I believe that the healing process is a journey of returning home to the authentic self with the therapeutic relationship as a vehicle supporting the exploration. My name is Aimee and I hope to walk along with you on this journey, offering compassion, co-regulation, and some education along the way. When we can lean into the understanding of how our thoughts, feelings, and behaviors have come to be, we may also begin to explore where we may have gotten stuck and how we can have a renewed access to the wisdom and assets that our bodies possess.  More Therapists Nearby [phone] Frederic Jericho Clinical Social Work/Therapist, MSW, LCSW, [facility] Verified 4 Endorsed [place], [place] [address] Are you or a loved one dealing with the aftermath of a traumatic experience? Do life transitions leave you grappling with feelings of depression or anxiety? Are you concerned  about your child's negative behaviors, sleep disturbances, or  difficulties during a divorce? It's important to understand that children primarily operate based on emotions, while adults tend to use rational thinking. This emotional focus can impact a child's ability to make effective decisions and express their feelings.  [phone] Verne Spurr  [facility] Clinical Social Work/Therapist, LCSW Verified 1 Endorsed Online Only Are you struggling with depression, anxiety, trauma, and the stressors that come with life? If so, I am a therapist for you! I specialize in, including treatment for anxiety and depression, navigating life stressors/transitions, and relationships. I enjoy working with adult women. However, I am open to clients of all backgrounds and genders. I truly value a client who is going to be collaborative in their therapeutic journey as I want you to be an active participant in your care. At [facility], our mission is to ameliorate mental and emotional stressors through a deeper look at one's unique human experiences.  [phone] [facility]or, [facility], CRC, LCAS, MBA, MS Verified 3 Endorsed [place], [place] [address] I specialize in mental health, trauma, substance abuse, and co-occurring disorders. I am Certified in EMDR which we use to process memories that may trigger a trauma response or the desire to use drugs. Some of our Trauma/PTSD processing has included Sexual Assault, Grief, Domestic Volience, In the line of Duty-Service Workers, TBI, and the loss of Employment. I work with clients with Depression, PTSD, Anxiety, Bipolar, and Schizophrenia. My approach is tailored to you and your specific needs. What I have learned through life experiences is that overcoming these battles can be easier when you are not walking alone down the path.  [phone] Brittney Crossen Counselor, MS, LCMHC-A Verified 2 Endorsed [place], [place] [address] I'm passionate about supporting Black  millennial and Gen Z individuals navigating anxiety, depression, or life transitions. Shoutout to YOU! Just being here means you're taking a step toward growth--for yourself or someone who matters to you. Admitting you need support isn't easy, but let's be real: we all need it sometimes. Whether you're going through a major life change or adjusting to a new chapter, those feelings of anxiety, confusion, or sadness are valid. You might not have all the answers right now, but that's where therapy comes in--helping you find clarity, cope, and thrive.  [phone] [facility] Clinical Social Work/Therapist, LCSW, ACSW, CSOTS, [place], MAC Verified [place], [place] [address] I also have extensive experience with a variety of issues such as: depression, anger management, career counseling, sex addiction, domestic violence, life coaching, LGBTQ and self-esteem. Welcome! My name is Ova Freshwater and I am a licensed clinical social worker providing quality supportive services. I am motivated to provide quality care to children, adolescents, and adults. I continue to work tirelessly to stay abreast of current trends as an out-patient therapist. I earned my Master's in Social Work at [employer] with a concentration in mental health and substance abuse. I have experience with working with a variety of addictions including but not limited to: sex, gambling, workaholics, substance abuse, and love/relationship.  [phone] Wilkie Aye Counselor, MS, LCMHC-A, EMDR-t Verified Online Only My areas of interest include anxiety, depression, self-esteem and providing a space for individuals experiencing loss due to incarceration. Are you looking for a therapist that understands you and your life experiences? Do you feel like you have to appear to have it all together when inside you are struggling? You may feel lonely and misunderstood. When you  look in the mirror  you may not recognize the person looking back at you. Going through the motions of everyday life feels exhausting. You've been running on autopilot and are headed toward burnout. Relationships may be harder to maintain as well as the relationship with yourself.  [phone] [facility] Clinical Social Work/Therapist Verified Serves Area [place] works with adolescents and adults to address depression, anxiety, gender and sexuality, self-esteem and self-image, trauma, and life transitions. Sometimes you just need the right person to talk to. Meet our professional, caring therapists, who provide confidential counseling for clients of all ages through individual, couples and family sessions. Each of our therapists can assist with adjustment and relationship issues, stress, anxiety, depression, anger management, grief, loss, and trauma. See our website link, and staff photos for details. - Our intern Michaelle Copas partners with clients in cultivating their strengths and building resiliency. She supports clients in identifying and developing healthy strategies to cope with life's stressors.   Address:  [address] [place], [place] [address] Hours: Mon-Fri 8AM-5PM Phone: [phone] Fax: [phone]  Services: Adult Services Advanced Access Walk-In Assessments - All Disabilities If you're a walk-in patient there is generally a wait time involved on a "first come, first served basis" otherwise contact us beforehand to setup an appointment. If you're in crisis please use our 24-Hour Crisis Hotline for immediate access to a clinician. If this is your first time, please bring the following with you:   Insurance/Medicaid Card  ID or Social Security Card Any referral documentation Routine Outpatient Therapy Psychiatry/Med Management Substance Abuse Intensive Outpatient (SAIOP) Medication Assisted Treatment - MAT (Suboxone) Tailored Care Management (TCM) Youth Services Advanced  Access Walk-In Assessments - All Disabilities Routine Outpatient Therapy Psychiatry/Med Management Tailored Care Management (TCM)  [phone] Lowella Petties Clinical Social Work/Therapist, LCSWA, MSW, QDDP Verified 1 Endorsed Serves Area If you struggle with anxiety, depression, stress, grief, PTSD, self-harming behaviors, personality disorders, I/DD individuals, are a parent or guardian of I/DD individuals, require counseling, or desire a stronger foundation with your children and family, allow me to meet you where you are. One of my favorite Philomena Course is "I can only get better when I accept myself as I am, where I have been, and where I am going!" I am passionate in my work. One of my main goals is to see you thrive. I want to aid you in your journey to better mental health. My therapeutic style is very laid back, and you will be the driving for in your treatment.  [phone] Quincy Sheehan Counselor, MA, [facility], NCC Verified Online Only [place] specializes in depression, anxiety, relationship issues, life transitions, sex therapy, narcissism, and both women's and men's issues. Talmage Nap is a Merchandiser, retail with 4 years of experience as a therapist, but over 15 years of experience in the mental health field. Archie Patten is passionate about helping individuals to address their challenges and live more rewarding lives.  [phone] [facility] Clinical Social Work/Therapist, MSW, LCSW, TF-CBT, CCTP Verified 4 Endorsed Serves Area "I'm okay. Everything's alright". You tell yourself this repetitively while withholding tears and smiling for everyone else. You secretly struggle to believe what God says about you. Some days you feel so alone. You battle to feel loved and good enough to be the person God created you to be. Anxiety, depression, fear, grief, loss, shame and trauma have become emotional strongholds. Life seems to keep  coming and you  feel depleted. You ultimately want to live in freedom in your mind, no longer enslaved by the pain of the past, comparison, and even fear that your future will be failure. You desire to sense hope and fulfillment again.  [phone] Milinda Pointer Licensed Professional Counselor, MS, NCC, [facility] Verified [place], [place] [address] My areas of expertise include treatment of anxiety, depression, family and relationship issues and other mental health concerns. Welcome and congratulations on taking a giant leap towards your freedom! We often let the pressures of the world dictate our emotions, feelings, and behaviors. Counseling can help you to find solutions to what is going on and provide you with the tools you need to begin feeling better. I will help you manage these stressors in a safe, confidential, and non-judgmental environment. My approach is person centered which means you are treated as a person first. Services are tailored around your individual circumstances.  [phone] Tiffany Harrison Mons Clinical Social Work/Therapist, MSW, LCSW, CFSW Verified Serves Area I can assist is many areas of your life including anxiety, depression, life changes, divorce, parenting, and even just general processing of life when life gets difficult. An ideal client for me is someone is is willing and ready to make lasting and impactful changes in their lives. An individuals goals can be fluid and changing and I enjoy assisting clients in exploring what their goals for therapy, life, and the future are.  [phone] Barrington Ellison LCSW Associate, MSW, LCSW-A Verified 2 Endorsed Serves Area Efraim Kaufmann is a Librarian, academic dedicated to helping individuals navigate life's challenges and achieve personal growth. With a Child psychotherapist of Social Work degree from the [employer] at [employer], she brings a deep understanding of human behavior and a holistic  approach to mental health. Melissa specializes in providing compassionate, evidence-based support for anxiety, depression, trauma, and relationship issues. Her therapeutic techniques include cognitive-behavioral therapy (CBT), solution-focused therapy, and mindfulness practices.  [phone] Heather Replogle Counselor, LCMHCS, LCAS Verified 1 Endorsed [place], [place] [address] I am a licensed mental health and addictions counselor committed to helping individuals overcome challenges and develop healthier relationships with themselves and others. My passion is in helping individuals struggling with anxiety, depression, substance use disorders, PTSD/trauma, life transitions, relationship issues, and self-esteem/identity issues. Incorporating elements of cognitive-behavioral, dialectical-behavioral, and solution-focused therapies, we can work together to discover what works best for you.  [phone] Therapy and Empowerment Research scientist (physical sciences), PhD, NCC, [facility], LPC, ACS Verified 1 Endorsed Online Only I enjoy working with adults, adolescents, children, and families, and I offer a strong background in play therapy, child and adolescent development, as well as anxiety and depression issues. Are you feeling under pressure, overwhelmed, and as though you are not enough? By utilizing the humanistic, existential, and solution-focused approaches, I can help you feel less pressured and overwhelmed by viewing you as a whole person, encouraging you to remain true to yourself (humanistic), helping you increase your self-awareness, accept responsibility, and know that you are enough so you can live a more authentic life (existential) while focusing on finding solutions for issues in the here and now (solution-focused). Whether you are an adult seeking assistance for yourself or family, or a child/adolescent, I am here to help.  [phone] Omar Person Licensed Clinical Social Worker Associate,  MSW, LCSWA Verified [place], [place] [address] I also provide therapy to women dealing with depression, anxiety, childhood trauma, and the navigation of life stressors. Life can throw all forms of  curveballs at [place] that we cannot control, where we sit and wonder what is going on. In life we get to a point where we stand up and say, I cannot do this alone. When we realize those challenges are too big to tackle alone, I am here to tell you it is okay to ask for help and know that there is support out there.  [phone] Evette Georges [facility] Clinical Mental Health Counselor Associate, MA, [facility] Verified [place], [place] [address] Whether working through anxiety, depression, life transitions, or other personal issues, I am here to offer support, guidance, and practical strategies tailored to their needs. As a dedicated counselor working with both adolescents and adults, my primary goal is to create a warm and welcoming space where my clients feel safe, valued, and understood. I believe that the therapeutic environment is crucial for fostering open communication and personal growth. My approach is grounded in empathy, respect, and a genuine commitment to helping each individual navigate their unique challenges.   Depression Therapists What is the goal of therapy for depression? Therapy for depression has several major goals. One is to relieve the mental pain of depression, which distorts feeling and thinking so that sufferers cannot see beyond their current state of mind or envision feeling better. Another is to give people the mental tools to recognize and correct the kinds of distorted thinking that turn a problem into a catastrophe and lead to despair. Therapy also teaches people how to process negative emotions in constructive ways, so they have more control over their own emotional reactivity. And it helps people regain the ability to see themselves positively, the motivation to do things, and the capacity for  pleasure.  What happens in therapy for depression? Perhaps most important, no matter the type of therapy, patients form an alliance with the therapist; that connection is therapeutic in itself, plus it becomes an instrument of change. Patients learn to identify and to challenge their own erroneous beliefs and thoughts that amplify the effects of negative experiences. They learn to identify situations in which they are especially vulnerable. And they learn new patterns of thinking and behaving. They may be given "homework" assignments in which they practice their developing skills. In addition, good therapists regularly monitor patients to assess whether and how much the condition is improving.  What therapy types help with depression? Several types of short-term therapy have been found effective, each targeting one or more areas of dysfunction. Cognitive behavioral therapy (CBT) helps clients challenge their negative thoughts and beliefs, learn new behavioral strategies, and curb reactivity to distressing situations. Behavioral activation (BA) is a form of therapy often used in conjunction with CBT; it focuses on engagement in rewarding activity as a pathway to changing negative feelings and disturbed mood. Another widely used approach is interpersonal therapy (IPT), which targets the social difficulties that both give rise to and get exacerbated by depression. Therapists may combine approaches as needed.  Can therapy for depression be done online? Studies have found that online therapy can be highly effective for treating depression, although it may be more challenging to build a good therapist-patient alliance on screen than in person--at least at first. However, online therapy can offer considerable advantages. Accessibility and convenience are tops among them. Some people actually find it easier to talk about problems online than in person. While online therapy typically limits visibility of facial  expression and body gestures that give important nonverbal cues to a patient's state of mind, it can give therapists  a glimpse into a patient's world and life, providing information that can be highly useful in guiding therapy.  How effective is therapy for depression? Many studies show that therapy is highly effective provided that patients complete the prescribed course of therapy, commonly 16 to 20 sessions. Over the long term, it is more effective than medication and the effects are more enduring. As a result, psychotherapy has the power not just to relieve current suffering but to prevent future episodes of the disorder. Therapy reverses the dysfunction in neural circuitry that disposes individuals to a negative view of themselves, the world, and their future and they acquire coping techniques, problem-solving skills, and understanding of their own vulnerabilities that are useful over the course of a lifetime.

## 2023-03-01 NOTE — Progress Notes (Signed)
BH MD/PA/NP OP Progress Note  03/01/2023 5:44 PM Tiffany [name]  MRN:  [id number]  Chief Complaint:  Chief Complaint  Patient presents with   Follow-up    Medication management   Virtual Visit via Video Note  I connected with Tiffany [name] on 03/01/23 at  2:30 PM EST by a video enabled telemedicine application and verified that I am speaking with the correct person using two identifiers.  Location: Patient: Home Provider: [facility]   I discussed the limitations of evaluation and management by telemedicine and the availability of in person appointments. The patient expressed understanding and agreed to proceed.   I discussed the assessment and treatment plan with the patient. The patient was provided an opportunity to ask questions and all were answered. The patient agreed with the plan and demonstrated an understanding of the instructions.   The patient was advised to call back or seek an in-person evaluation if the symptoms worsen or if the condition fails to improve as anticipated.  I provided 30 minutes of non-face-to-face time during this encounter.   Sahvannah Rieser, NP    HPI: Tiffany [name] 33 y.o. female patient is seen via  virtual video visit today for follow up medication management by this provider, and chart reviewed on 03/01/2023.  [name] psychiatric history includes general anxiety disorder, major depressive disorder recurrent, moderate, and insomnia unspecified.   Her mental health is currently managed with Zoloft 25 mg daily.   She reports she has only taken her medication twice since her last visit.  "I started a new job 2 weeks ago and I didn't want to take any medication since I was just starting.  The two times that I took it my boyfriend said I was acting giddy."  She reports after starting her new job she was out all last week related to having the flu.  She reports no adverse reactions to the Zoloft "But I really couldn't tell a  difference."  Reiterated that it usually takes a couple weeks before feeling the full benefit of medication.  Understanding voiced.  Encouraged to take medication daily.  Informed she could take it in the morning or at bedtime which ever she preferred.    At this time, she reports depression and anxiety are manageable.  She denies suicidal/self-harm/homicidal ideation, psychosis, paranoia, fluctuations in mood, and abnormal movement.  C-SSRS screen completed by provider see score below.  she reports eating without difficulty.  She states she feels she has been sleeping to much but feels it related to being sick.  "I just don't have the energy to do nothing but lay in bed.  I felt a little better today but not well enough to go back to work."  No changes in medication at this time will continue Zoloft 25 mg daily.  Will take medication daily as prescribed and follow up in one month.  Instructed to call if need to be seen sooner or if she has any problems.  Understanding voiced and agrees to plan.   Visit Diagnosis:    ICD-10-CM   1. Generalized anxiety disorder  F41.1 sertraline (ZOLOFT) 25 MG tablet    2. MDD (major depressive disorder), recurrent episode, moderate (HCC)  F33.1 sertraline (ZOLOFT) 25 MG tablet    3. Insomnia, unspecified type  G47.00       Past Psychiatric History: GAD,MDD, insomnia   Past Medical History:  Past Medical History:  Diagnosis Date   Anxiety  Chronic back pain    Depression    Vaginal Pap smear, abnormal     Past Surgical History:  Procedure Laterality Date   EXTERNAL EAR SURGERY Left    LAPAROSCOPIC BILATERAL SALPINGECTOMY Bilateral 02/21/2020   Procedure: LAPAROSCOPIC BILATERAL TUBAL LIGATION, BILATERAL SALPINGECTOMY TECHNIQUE;  Surgeon: Lazaro Arms, MD;  Location: [facility];  Service: Gynecology;  Laterality: Bilateral;   LEG SURGERY Left     Family Psychiatric History:  Mother and maternal grandmother Depression   Family History:  Family History   Problem Relation Age of Onset   Depression Mother    Asthma Sister    Asthma Sister    Depression Maternal Grandmother    Hypertension Maternal Grandfather    Other Maternal Grandfather        blood clots    Social History:  Social History   Socioeconomic History   Marital status: Single    Spouse name: Not on file   Number of children: 2   Years of education: Not on file   Highest education level: Not on file  Occupational History   Not on file  Tobacco Use   Smoking status: Former    Current packs/day: 0.00    Types: Cigarettes    Quit date: 03/2022    Years since quitting: 0.9   Smokeless tobacco: Never  Vaping Use   Vaping status: Never Used  Substance and Sexual Activity   Alcohol use: Yes    Comment: occ   Drug use: Not Currently    Types: Marijuana    Comment: used in the past   Sexual activity: Yes    Birth control/protection: Surgical, Condom  Other Topics Concern   Not on file  Social History Narrative   Not on file   Social Drivers of Health   Financial Resource Strain: Not on file  Food Insecurity: Not on file  Transportation Needs: Not on file  Physical Activity: Not on file  Stress: Not on file  Social Connections: Not on file    Allergies:  Allergies  Allergen Reactions   Latex Rash   Other Itching and Swelling    Coconut    Metabolic Disorder Labs: Lab Results  Component Value Date   HGBA1C 5.8 (H) 01/06/2023   No results found for: "PROLACTIN" Lab Results  Component Value Date   CHOL 137 01/06/2023   TRIG 49 01/06/2023   HDL 57 01/06/2023   CHOLHDL 2.4 01/06/2023   LDLCALC 69 01/06/2023   LDLCALC 75 06/19/2020   Lab Results  Component Value Date   TSH 1.770 01/06/2023   TSH 0.987 05/15/2022    Therapeutic Level Labs: No results found for: "LITHIUM" No results found for: "VALPROATE" No results found for: "CBMZ"  Current Medications: Current Outpatient Medications  Medication Sig Dispense Refill   metroNIDAZOLE  (FLAGYL) 500 MG tablet Take 1 tablet (500 mg total) by mouth 2 (two) times daily. 14 tablet 0   sertraline (ZOLOFT) 25 MG tablet Take 1 tablet (25 mg total) by mouth daily. 30 tablet 0   traZODone (DESYREL) 50 MG tablet 25-50 mg at night for insomnia 90 tablet 3   No current facility-administered medications for this visit.     Musculoskeletal: Strength & Muscle Tone:  Unable to assess via virtual visit Gait & Station: Unable to assess via virtual visit Patient leans: N/A  Psychiatric Specialty Exam: Review of Systems  Constitutional:        No other complaints voiced  Psychiatric/Behavioral:  Positive for  dysphoric mood. Negative for agitation, confusion, hallucinations, self-injury, sleep disturbance and suicidal ideas. The patient is nervous/anxious.   All other systems reviewed and are negative.   There were no vitals taken for this visit.There is no height or weight on file to calculate BMI.  General Appearance: Casual  Eye Contact:  Good  Speech:  Clear and Coherent and Normal Rate  Volume:  Normal  Mood:  Euthymic  Affect:  Appropriate and Congruent  Thought Process:  Coherent, Goal Directed, and Descriptions of Associations: Intact  Orientation:  Full (Time, Place, and Person)  Thought Content: WDL and Logical   Suicidal Thoughts:  No  Homicidal Thoughts:  No  Memory:  Immediate;   Good Recent;   Good Remote;   Good  Judgement:  Intact  Insight:  Good and Present  Psychomotor Activity:  Normal  Concentration:  Concentration: Good and Attention Span: Good  Recall:  Good  Fund of Knowledge: Good  Language: Good  Akathisia:  No  Handed:  Right  AIMS (if indicated): not done.  Denies abnormal movement  Assets:  Communication Skills Desire for Improvement Housing Intimacy Leisure Time Physical Health Resilience Social Support Transportation  ADL's:  Intact  Cognition: WNL  Sleep:  Good   Screenings: AIMS    Flowsheet Row Office Visit from 02/15/2023 in  [facility] [place]  AIMS Total Score 0      GAD-7    Flowsheet Row Office Visit from 02/15/2023 in [facility] [place] Office Visit from 01/06/2023 in [facility] Office Visit from 11/03/2022 in [facility] Office Visit from 05/15/2022 in [facility] Office Visit from 04/30/2022 in [facility]  Total GAD-7 Score 11 14 9 15 10       PHQ2-9    Flowsheet Row Office Visit from 02/15/2023 in [facility] [place] Office Visit from 01/06/2023 in [facility] Office Visit from 11/03/2022 in [facility] Office Visit from 05/15/2022 in [facility] Office Visit from 04/30/2022 in [facility]  PHQ-2 Total Score 6 6 4 4 2   PHQ-9 Total Score 18 18 6 13 8       Flowsheet Row Video Visit from 03/01/2023 in [facility] [place] ED from 12/10/2021 in [facility] ED from 04/28/2021 in [facility] [place]  C-SSRS RISK CATEGORY No Risk No Risk No Risk      Assessment and Plan: Lenix Benoist. Estabrooks appears to be doing well.  However she reported she has only taken her medication twice since last visit.  She denies adverse reaction to Zoloft but states that her boyfriend told her that the 2 days she took it she acted giddy.  During visit she is dressed appropriate for weather.  She lying on bed with no noted distress.  She is alert/oriented x 4, calm/cooperative, and pleasant.  Her mood is congruent with affect.  She spoke in a clear tone at moderate volume, and normal pace, with good eye contact.  Her thought process is coherent, relevant; and  there is no indication that she is currently responding to internal/external stimuli, or experiencing delusional thought content.  She denies suicidal/self-harm/homicidal ideation, psychosis, paranoia, fluctuations in mood, and abnormal movement. She also  reports eating and sleeping without difficulty. No changes in medications at this time will continue Zoloft 25 mg daily for depression and anxiety.  Follow up 4 weeks .  1. Generalized anxiety disorder (Primary) - sertraline (ZOLOFT) 25 MG tablet; Take 1 tablet (25 mg total) by mouth daily.  Dispense: 30 tablet; Refill: 0  2. MDD (major depressive disorder), recurrent episode, moderate (HCC) - sertraline (ZOLOFT) 25 MG tablet; Take 1 tablet (25 mg total) by mouth daily.  Dispense: 30 tablet; Refill: 0  3. Insomnia, unspecified type    Collaboration of Care: Collaboration of Care: Medication Management AEB Education and medication refill Meds ordered this encounter  Medications   sertraline (ZOLOFT) 25 MG tablet    Sig: Take 1 tablet (25 mg total) by mouth daily.    Dispense:  30 tablet    Refill:  0    Supervising Provider:   Kathryne Sharper T [[id number]]     Patient/Guardian was advised Release of Information must be obtained prior to any record release in order to collaborate their care with an outside provider. Patient/Guardian was advised if they have not already done so to contact the registration department to sign all necessary forms in order for [place] to release information regarding their care.   Consent: Patient/Guardian gives verbal consent for treatment and assignment of benefits for services provided during this visit. Patient/Guardian expressed understanding and agreed to proceed.    Olamide Lahaie, NP 03/01/2023, 5:44 PM

## 2023-08-01 ENCOUNTER — Other Ambulatory Visit: Payer: Self-pay

## 2023-08-01 ENCOUNTER — Emergency Department (HOSPITAL_COMMUNITY)
Admission: EM | Admit: 2023-08-01 | Discharge: 2023-08-02 | Disposition: A | Discharge status: Home / Self Care | Attending: Emergency Medicine | Admitting: Emergency Medicine

## 2023-08-01 ENCOUNTER — Encounter (HOSPITAL_COMMUNITY): Payer: Self-pay

## 2023-08-01 DIAGNOSIS — R1033 Periumbilical pain: Secondary | ICD-10-CM | POA: Insufficient documentation

## 2023-08-01 DIAGNOSIS — Z91040 Latex allergy status: Secondary | ICD-10-CM | POA: Insufficient documentation

## 2023-08-01 NOTE — ED Triage Notes (Signed)
 Pt reports abd pain since having her tubes removed several years ago. Pt says she has been seen once for this and had MRI done but never got results. Pain has been intermittent, but worse today

## 2023-08-02 MED ORDER — OXYCODONE-ACETAMINOPHEN 5-325 MG PO TABS
2.0000 | ORAL_TABLET | Freq: Once | ORAL | Status: AC
  Administered 2023-08-02: 2 via ORAL
  Filled 2023-08-02: qty 2

## 2023-08-02 MED ORDER — IBUPROFEN 400 MG PO TABS
400.0000 mg | ORAL_TABLET | Freq: Once | ORAL | Status: AC
  Administered 2023-08-02: 400 mg via ORAL
  Filled 2023-08-02: qty 1

## 2023-08-02 NOTE — ED Provider Notes (Signed)
  [place] EMERGENCY DEPARTMENT AT [facility] Provider Note   CSN: [id number] Arrival date & time: 08/01/23  2213     Patient presents with: Abdominal Pain   Tiffany [name] is a 33 y.o. female.   33 yo F w/ h/o of periumbilical pain for the last couple years since having fallopian tubes removed. Progressively worsening. Usually associated with her menstrual cycle but not today. Has been seen in ED previously who recommended outpatient follow up. Seen by pcp and ct done showing likely leiomyoma but never got confirmatory ultrasound. No vaginal bleeding or discharge. No urionary symptoms. No fevers, nausea or vomiting.    Abdominal Pain      Prior to Admission medications   Medication Sig Start Date End Date Taking? Authorizing Provider  metroNIDAZOLE  (FLAGYL ) 500 MG tablet Take 1 tablet (500 mg total) by mouth 2 (two) times daily. 01/08/23   Severa Rock HERO, FNP  sertraline  (ZOLOFT ) 25 MG tablet Take 1 tablet (25 mg total) by mouth daily. 03/01/23   Rankin, Shuvon B, NP  traZODone  (DESYREL ) 50 MG tablet 25-50 mg at night for insomnia 06/19/20   Merlynn Niki FALCON, FNP    Allergies: Latex and Other    Review of Systems  Gastrointestinal:  Positive for abdominal pain.    Updated Vital Signs BP 130/75   Pulse 85   Temp 98 F (36.7 C)   Resp 17   Ht 5' 7 (1.702 m)   Wt 117.9 kg   LMP 07/23/2023 (Approximate)   SpO2 99%   BMI 40.72 kg/m   Physical Exam Vitals and nursing note reviewed.  Constitutional:      Appearance: She is well-developed.  HENT:     Head: Normocephalic and atraumatic.  Cardiovascular:     Rate and Rhythm: Normal rate and regular rhythm.  Pulmonary:     Effort: No respiratory distress.     Breath sounds: No stridor.  Abdominal:     General: There is no distension.  Musculoskeletal:     Cervical back: Normal range of motion.  Neurological:     Mental Status: She is alert.     (all labs ordered are listed, but only abnormal results  are displayed) Labs Reviewed - No data to display  EKG: None  Radiology: No results found.   Procedures   Medications Ordered in the ED  oxyCODONE -acetaminophen  (PERCOCET/ROXICET) 5-325 MG per tablet 2 tablet (2 tablets Oral Given 08/02/23 0007)  ibuprofen  (ADVIL ) tablet 400 mg (400 mg Oral Given 08/02/23 0007)                                    Medical Decision Making Amount and/or Complexity of Data Reviewed Radiology: ordered.  Risk Prescription drug management.   No new symptoms just frustration with not knowing. Recommended US  as patient has not had one yet as fibroids could cause all of her discomfort. Doesn't want meds. Will get US  and fu w/ Gyn.      Final diagnoses:  Periumbilical abdominal pain    ED Discharge Orders          Ordered    US  PELVIC COMPLETE WITH TRANSVAGINAL        08/02/23 0011               Henery Betzold, Selinda, MD 08/02/23 [phone]

## 2023-08-04 ENCOUNTER — Ambulatory Visit (HOSPITAL_COMMUNITY)
Admission: RE | Admit: 2023-08-04 | Discharge: 2023-08-04 | Disposition: A | Discharge status: Home / Self Care | Source: Ambulatory Visit | Attending: Emergency Medicine | Admitting: Emergency Medicine

## 2023-08-04 DIAGNOSIS — R1033 Periumbilical pain: Secondary | ICD-10-CM | POA: Insufficient documentation

## 2023-08-04 DIAGNOSIS — R9389 Abnormal findings on diagnostic imaging of other specified body structures: Secondary | ICD-10-CM | POA: Diagnosis not present

## 2023-08-06 ENCOUNTER — Encounter: Payer: Self-pay | Admitting: *Deleted

## 2024-01-18 ENCOUNTER — Encounter: Payer: Medicaid Other | Admitting: Family Medicine

## 2024-02-01 ENCOUNTER — Encounter: Payer: Self-pay | Admitting: Family Medicine

## 2024-02-01 ENCOUNTER — Ambulatory Visit: Payer: Self-pay | Admitting: Family Medicine

## 2024-02-01 VITALS — BP 112/78 | HR 73 | Temp 96.9°F | Ht 67.0 in | Wt 266.2 lb

## 2024-02-01 DIAGNOSIS — G4726 Circadian rhythm sleep disorder, shift work type: Secondary | ICD-10-CM

## 2024-02-01 DIAGNOSIS — F411 Generalized anxiety disorder: Secondary | ICD-10-CM | POA: Diagnosis not present

## 2024-02-01 DIAGNOSIS — Z0000 Encounter for general adult medical examination without abnormal findings: Secondary | ICD-10-CM

## 2024-02-01 DIAGNOSIS — Z6841 Body Mass Index (BMI) 40.0 and over, adult: Secondary | ICD-10-CM

## 2024-02-01 DIAGNOSIS — Z0001 Encounter for general adult medical examination with abnormal findings: Secondary | ICD-10-CM

## 2024-02-01 DIAGNOSIS — Z0184 Encounter for antibody response examination: Secondary | ICD-10-CM

## 2024-02-01 DIAGNOSIS — R635 Abnormal weight gain: Secondary | ICD-10-CM

## 2024-02-01 DIAGNOSIS — F331 Major depressive disorder, recurrent, moderate: Secondary | ICD-10-CM

## 2024-02-01 LAB — BAYER DCA HB A1C WAIVED: HB A1C (BAYER DCA - WAIVED): 6.2 % — ABNORMAL HIGH (ref 4.8–5.6)

## 2024-02-01 MED ORDER — TRAZODONE HCL 50 MG PO TABS: ORAL_TABLET | ORAL | 1 refills | Status: AC

## 2024-02-01 MED ORDER — SERTRALINE HCL 25 MG PO TABS: 25.0000 mg | ORAL_TABLET | Freq: Every day | ORAL | 1 refills | Status: AC

## 2024-02-01 NOTE — Progress Notes (Signed)
 "  Complete physical exam  Patient: Tiffany [name]. Tiffany [name]   DOB: 01/06/1991   33 y.o. Female  MRN: [id number]  Subjective:    Chief Complaint  Patient presents with   Annual Exam    Tiffany Kalisz. [name] is a 34 y.o. female who presents today for a complete physical exam. She reports consuming a general diet. The patient has a physically strenuous job, but has no regular exercise apart from work.  She generally feels well. She reports sleeping poorly. She does have additional problems to discuss today.   Tiffany [name] is a 34 year old female who presents for an annual physical exam and follow-up on anxiety, depression, and fibroids.  She has not had a physical since last year and has not yet seen a gynecologist for her abnormal Pap smear, which indicated the need for a colposcopy and possibly a LEEP procedure. She has not completed her hepatitis B titers or HPV vaccinations.  She works night shifts for the past five to six months, impacting her sleep and overall well-being. She describes feeling 'rough' due to the night shifts and often feels tired, especially after waking up to attend appointments.  Regarding her mental health, she has a history of anxiety and depression. She is prescribed sertraline  and trazodone  but uses them inconsistently, taking sertraline  only two to three days a week due to forgetfulness and her work schedule. She notes feeling somewhat better when she takes it, though she still does not feel like herself. She takes trazodone  primarily on weekends when she can sleep in, as it makes waking up difficult.  She has experienced weight gain since discontinuing birth control and starting a new one, which she finds difficult to lose. She is concerned about her weight gain and its persistence despite being active and eating well. She mentions a previous slightly elevated A1c level but does not recall the last time it was checked.  She was seen in the emergency room for fibroids and was  advised to follow up with a gynecologist and complete an ultrasound, which she has not yet done. She has been experiencing abdominal issues and is eager to determine the cause.  No vision or hearing changes, leg swelling, chest pain, or shortness of breath. She notes chest soreness associated with anxiety and reports normal bowel habits.       Most recent fall risk assessment:    02/01/2024   10:48 AM  Fall Risk   Falls in the past year? 0  Risk for fall due to : No Fall Risks  Follow up Falls evaluation completed     Most recent depression screenings:    02/01/2024   10:49 AM 02/15/2023   10:10 AM 01/06/2023    2:37 PM 11/03/2022    8:27 AM 05/15/2022    3:18 PM  Depression screen PHQ 2/9  Decreased Interest 1  3 2 2   Down, Depressed, Hopeless 1  3 2 2   PHQ - 2 Score 2  6 4 4   Altered sleeping 1  3 0 3  Tired, decreased energy 1  3 2 2   Change in appetite 1  3 0 2  Feeling bad or failure about yourself  1  0 0 2  Trouble concentrating 0  2 0 0  Moving slowly or fidgety/restless 0  1 0 0  Suicidal thoughts 0  0 0 0  PHQ-9 Score 6  18  6  13    Difficult doing work/chores Somewhat difficult  Very  difficult Somewhat difficult      Information is confidential and restricted. Go to Review Flowsheets to unlock data.   Data saved with a previous flowsheet row definition      02/01/2024   10:49 AM 02/15/2023   10:11 AM 01/06/2023    2:38 PM 11/03/2022    8:28 AM  GAD 7 : Generalized Anxiety Score  Nervous, Anxious, on Edge 1  2 0  Control/stop worrying 3  3 3   Worry too much - different things 3  3 3   Trouble relaxing 1  3 0  Restless 0  0 0  Easily annoyed or irritable 1  3 3   Afraid - awful might happen 0  0 0  Total GAD 7 Score 9  14 9   Anxiety Difficulty Somewhat difficult   Not difficult at all     Information is confidential and restricted. Go to Review Flowsheets to unlock data.       Vision:Not within last year  and Dental: No current dental problems  Patient  Active Problem List   Diagnosis Date Noted   Circadian rhythm sleep disorder, shift work type 02/01/2024   Morbid obesity (HCC) 05/15/2022   Generalized anxiety disorder 11/01/2020   Herpes zoster without complication 07/30/2020   Difficulty sleeping 06/19/2020   Seasonal allergies 06/19/2020   MDD (major depressive disorder), recurrent episode, moderate (HCC) 10/12/2018   Chronic right-sided low back pain with right-sided sciatica 10/12/2018   Anxiety 10/05/2017   Past Medical History:  Diagnosis Date   Anxiety    Chronic back pain    Depression    Vaginal Pap smear, abnormal    Past Surgical History:  Procedure Laterality Date   EXTERNAL EAR SURGERY Left    LAPAROSCOPIC BILATERAL SALPINGECTOMY Bilateral 02/21/2020   Procedure: LAPAROSCOPIC BILATERAL TUBAL LIGATION, BILATERAL SALPINGECTOMY TECHNIQUE;  Surgeon: Jayne Vonn DEL, MD;  Location: [facility];  Service: Gynecology;  Laterality: Bilateral;   LEG SURGERY Left    Social History[1] Social History   Socioeconomic History   Marital status: Single    Spouse name: Not on file   Number of children: 2   Years of education: Not on file   Highest education level: Not on file  Occupational History   Not on file  Tobacco Use   Smoking status: Former    Current packs/day: 0.00    Types: Cigarettes    Quit date: 03/2022    Years since quitting: 1.8   Smokeless tobacco: Never  Vaping Use   Vaping status: Never Used  Substance and Sexual Activity   Alcohol use: Yes    Comment: occ   Drug use: Not Currently    Types: Marijuana    Comment: used in the past   Sexual activity: Yes    Birth control/protection: Surgical, Condom  Other Topics Concern   Not on file  Social History Narrative   Not on file   Social Drivers of Health   Tobacco Use: Medium Risk (02/01/2024)   Patient History    Smoking Tobacco Use: Former    Smokeless Tobacco Use: Never    Passive Exposure: Not on Actuary Strain: Not on file   Food Insecurity: Not on file  Transportation Needs: Not on file  Physical Activity: Not on file  Stress: Not on file  Social Connections: Not on file  Intimate Partner Violence: Not on file  Depression (PHQ2-9): Medium Risk (02/01/2024)   Depression (PHQ2-9)    PHQ-2 Score: 6  Alcohol  Screen: Not on file  Housing: Not on file  Utilities: Not on file  Health Literacy: Not on file   Family Status  Relation Name Status   Mother  Alive   Father  Alive   Sister  Alive   Brother  Alive   Son  Alive   Brother  Alive   Sister  Alive   Sister  Alive   PGF  Alive   PGM  Alive   MGM  Alive   MGF  Alive   Sister  Alive   Son  Alive  No partnership data on file   Family History  Problem Relation Age of Onset   Depression Mother    Asthma Sister    Asthma Sister    Depression Maternal Grandmother    Hypertension Maternal Grandfather    Other Maternal Grandfather        blood clots   Allergies[2]    Patient Care Team: Severa Rock HERO, FNP as PCP - General (Family Medicine) Signa Delon LABOR, NP as Nurse Practitioner (Obstetrics and Gynecology)   Show/hide medication list[3]  ROS per HPI      Objective:     BP 112/78   Pulse 73   Temp (!) 96.9 F (36.1 C)   Ht 5' 7 (1.702 m)   Wt 266 lb 3.2 oz (120.7 kg)   SpO2 96%   BMI 41.69 kg/m  BP Readings from Last 3 Encounters:  02/01/24 112/78  08/02/23 130/75  01/06/23 110/67   Wt Readings from Last 3 Encounters:  02/01/24 266 lb 3.2 oz (120.7 kg)  08/01/23 260 lb (117.9 kg)  01/06/23 268 lb (121.6 kg)   SpO2 Readings from Last 3 Encounters:  02/01/24 96%  08/02/23 99%  01/06/23 97%      Physical Exam Vitals and nursing note reviewed.  Constitutional:      General: She is not in acute distress.    Appearance: Normal appearance. She is well-developed and well-groomed. She is morbidly obese. She is not ill-appearing, toxic-appearing or diaphoretic.  HENT:     Head: Normocephalic and atraumatic.      Jaw: There is normal jaw occlusion.     Right Ear: Hearing, tympanic membrane, ear canal and external ear normal.     Left Ear: Hearing, tympanic membrane, ear canal and external ear normal.     Nose: Nose normal.     Mouth/Throat:     Lips: Pink.     Mouth: Mucous membranes are moist.     Pharynx: Oropharynx is clear. Uvula midline.  Eyes:     General: Lids are normal.     Extraocular Movements: Extraocular movements intact.     Conjunctiva/sclera: Conjunctivae normal.     Pupils: Pupils are equal, round, and reactive to light.  Neck:     Thyroid : No thyroid  mass, thyromegaly or thyroid  tenderness.     Vascular: No carotid bruit or JVD.     Trachea: Trachea and phonation normal.  Cardiovascular:     Rate and Rhythm: Normal rate and regular rhythm.     Chest Wall: PMI is not displaced.     Pulses: Normal pulses.     Heart sounds: Normal heart sounds. No murmur heard.    No friction rub. No gallop.  Pulmonary:     Effort: Pulmonary effort is normal. No respiratory distress.     Breath sounds: Normal breath sounds. No wheezing.  Abdominal:     General: Bowel sounds are normal. There is  no distension or abdominal bruit.     Palpations: Abdomen is soft. There is no hepatomegaly or splenomegaly.     Tenderness: There is no abdominal tenderness. There is no right CVA tenderness or left CVA tenderness.     Hernia: No hernia is present.  Genitourinary:    Comments: Referred to GYN Musculoskeletal:        General: Normal range of motion.     Cervical back: Normal range of motion and neck supple.     Right lower leg: No edema.     Left lower leg: No edema.  Lymphadenopathy:     Cervical: No cervical adenopathy.  Skin:    General: Skin is warm and dry.     Capillary Refill: Capillary refill takes less than 2 seconds.     Coloration: Skin is not cyanotic, jaundiced or pale.     Findings: No rash.  Neurological:     General: No focal deficit present.     Mental Status: She is alert  and oriented to person, place, and time.     Sensory: Sensation is intact.     Motor: Motor function is intact.     Coordination: Coordination is intact.     Gait: Gait is intact.     Deep Tendon Reflexes: Reflexes are normal and symmetric.  Psychiatric:        Attention and Perception: Attention and perception normal.        Mood and Affect: Mood is depressed. Affect is flat.        Speech: Speech normal.        Behavior: Behavior normal. Behavior is cooperative.        Thought Content: Thought content normal.        Cognition and Memory: Cognition and memory normal.        Judgment: Judgment normal.      Last CBC Lab Results  Component Value Date   WBC 6.9 01/06/2023   HGB 13.2 01/06/2023   HCT 41.1 01/06/2023   MCV 82 01/06/2023   MCH 26.2 (L) 01/06/2023   RDW 14.1 01/06/2023   PLT 410 01/06/2023   Last metabolic panel Lab Results  Component Value Date   GLUCOSE 77 01/06/2023   NA 143 01/06/2023   K 4.6 01/06/2023   CL 104 01/06/2023   CO2 22 01/06/2023   BUN 12 01/06/2023   CREATININE 0.82 01/06/2023   EGFR 97 01/06/2023   CALCIUM 10.0 01/06/2023   PROT 6.8 01/06/2023   ALBUMIN 4.3 01/06/2023   LABGLOB 2.5 01/06/2023   AGRATIO 2.0 05/15/2022   BILITOT <0.2 01/06/2023   ALKPHOS 70 01/06/2023   AST 11 01/06/2023   ALT 12 01/06/2023   ANIONGAP 8 02/05/2020   Last lipids Lab Results  Component Value Date   CHOL 137 01/06/2023   HDL 57 01/06/2023   LDLCALC 69 01/06/2023   TRIG 49 01/06/2023   CHOLHDL 2.4 01/06/2023   Last hemoglobin A1c Lab Results  Component Value Date   HGBA1C 5.8 (H) 01/06/2023   Last thyroid  functions Lab Results  Component Value Date   TSH 1.770 01/06/2023   T4TOTAL 8.4 01/06/2023   Last vitamin D  Lab Results  Component Value Date   VD25OH 21.3 (L) 05/15/2022   Last vitamin B12 and Folate Lab Results  Component Value Date   VITAMINB12 617 01/06/2023   FOLATE 4.6 01/06/2023        Assessment & Plan:    Routine  Health Maintenance and Physical  Exam  Immunization History  Administered Date(s) Administered   MMR 08/11/2014   Moderna SARS-COV2 Booster Vaccination 01/29/2020   Moderna Sars-Covid-2 Vaccination 05/24/2019, 06/21/2019   PPD Test 01/06/2021   Tdap 08/10/2014    Health Maintenance  Topic Date Due   Cervical Cancer Screening (HPV/Pap Cotest)  06/20/2023   COVID-19 Vaccine (4 - 2025-26 season) 02/17/2024 (Originally 09/27/2023)   Influenza Vaccine  04/25/2024 (Originally 08/27/2023)   Hepatitis B Vaccines 19-59 Average Risk (1 of 3 - 19+ 3-dose series) 01/31/2025 (Originally 07/01/2009)   HPV VACCINES (1 - 3-dose SCDM series) 01/31/2025 (Originally 07/01/2017)   DTaP/Tdap/Td (2 - Td or Tdap) 08/09/2024   Hepatitis C Screening  Completed   HIV Screening  Completed   Meningococcal B Vaccine  Aged Out   Pneumococcal Vaccine  Discontinued    Discussed health benefits of physical activity, and encouraged her to engage in regular exercise appropriate for her age and condition.  Problem List Items Addressed This Visit       Other   MDD (major depressive disorder), recurrent episode, moderate (HCC)   Relevant Medications   traZODone  (DESYREL ) 50 MG tablet   sertraline  (ZOLOFT ) 25 MG tablet   Other Relevant Orders   CBC with Differential/Platelet   CMP14+EGFR   TSH   T4, free   Generalized anxiety disorder   Relevant Medications   traZODone  (DESYREL ) 50 MG tablet   sertraline  (ZOLOFT ) 25 MG tablet   Other Relevant Orders   CBC with Differential/Platelet   CMP14+EGFR   TSH   T4, free   Morbid obesity (HCC)   Relevant Orders   Lipid panel   CBC with Differential/Platelet   CMP14+EGFR   Bayer DCA Hb A1c Waived   TSH   T4, free   VITAMIN D  25 Hydroxy (Vit-D Deficiency, Fractures)   Vitamin B12   Circadian rhythm sleep disorder, shift work type   Relevant Medications   traZODone  (DESYREL ) 50 MG tablet   Other Relevant Orders   CBC with Differential/Platelet   CMP14+EGFR   TSH    T4, free   Other Visit Diagnoses       Annual physical exam    -  Primary   Relevant Orders   CBC with Differential/Platelet   CMP14+EGFR   Ambulatory referral to Gynecology     Immunity status testing       Relevant Orders   Hepatitis B surface antibody,qualitative     Weight gain       Relevant Orders   CBC with Differential/Platelet   CMP14+EGFR   TSH   T4, free   Vitamin B12         Woman's Wellness Visit Routine wellness visit with emphasis on health maintenance and preventative medicine. - Ordered hepatitis B immunity status testing - Referred to gynecologist for abnormal cervical cytology and colposcopy/LEEP - Discussed HPV vaccination with gynecologist - Encouraged regular dental and eye check-ups  Major depressive disorder, recurrent episode, moderate Recurrent moderate depressive disorder with inconsistent medication adherence. Reports variable control of symptoms and occasional exacerbations. Inconsistent use of sertraline , taking it 2-3 days a week. Reports improvement when taking medication consistently. - Encouraged consistent daily use of sertraline  - Set a timer on phone for medication reminders - Scheduled follow-up in 3 months to assess medication efficacy  Generalized anxiety disorder Intermittent exacerbations. Inconsistent use of sertraline , which helps when taken regularly. Reports chest soreness associated with anxiety. - Encouraged consistent daily use of sertraline  - Scheduled follow-up in 3 months to assess  medication efficacy  Circadian rhythm sleep disorder, shift work type Circadian rhythm sleep disorder related to night shift work. Inconsistent use of trazodone  for sleep, primarily on weekends. Reports difficulty with sleep and grogginess upon waking. - Encouraged consistent use of trazodone  for sleep - Advised ensuring adequate sleep time to avoid grogginess  Abnormal cervical cytology, referral for colposcopy/LEEP Abnormal cervical  cytology requiring colposcopy and possible LEEP procedure. Previous abnormal Pap smear necessitating further evaluation. - Referred to gynecologist for colposcopy and possible LEEP procedure  Symptomatic uterine fibroids Symptomatic uterine fibroids with previous emergency room visit. No abnormalities found on ultrasound. Reports ongoing abdominal issues. - Referred to gynecologist for further evaluation and management  Abnormal weight gain Significant weight gain since discontinuation of birth control. Possible contributing factors include thyroid  dysfunction, vitamin deficiencies, or diabetes. No current routine for diet and exercise. - Ordered thyroid  function tests - Ordered vitamin D  and B12 levels - Ordered A1c to assess for diabetes - Recommended My Fitness Pal app for tracking diet and exercise - Scheduled follow-up in 3 months to assess weight management       Return in about 3 months (around 05/01/2024) for Depression, Anxiety.     Rosaline Bruns, FNP      [1]  Social History Tobacco Use   Smoking status: Former    Current packs/day: 0.00    Types: Cigarettes    Quit date: 03/2022    Years since quitting: 1.8   Smokeless tobacco: Never  Vaping Use   Vaping status: Never Used  Substance Use Topics   Alcohol use: Yes    Comment: occ   Drug use: Not Currently    Types: Marijuana    Comment: used in the past  [2]  Allergies Allergen Reactions   Other Itching and Swelling    Coconut   Latex Rash  [3]  Outpatient Medications Prior to Visit  Medication Sig   [DISCONTINUED] sertraline  (ZOLOFT ) 25 MG tablet Take 1 tablet (25 mg total) by mouth daily.   [DISCONTINUED] traZODone  (DESYREL ) 50 MG tablet 25-50 mg at night for insomnia   [DISCONTINUED] metroNIDAZOLE  (FLAGYL ) 500 MG tablet Take 1 tablet (500 mg total) by mouth 2 (two) times daily.   No facility-administered medications prior to visit.   "

## 2024-02-02 ENCOUNTER — Ambulatory Visit: Payer: Self-pay | Admitting: Family Medicine

## 2024-02-02 DIAGNOSIS — E559 Vitamin D deficiency, unspecified: Secondary | ICD-10-CM

## 2024-02-02 LAB — CMP14+EGFR
ALT: 13 IU/L (ref 0–32)
AST: 10 IU/L (ref 0–40)
Albumin: 4 g/dL (ref 3.9–4.9)
Alkaline Phosphatase: 59 IU/L (ref 41–116)
BUN/Creatinine Ratio: 15 (ref 9–23)
BUN: 14 mg/dL (ref 6–20)
Bilirubin Total: 0.4 mg/dL (ref 0.0–1.2)
CO2: 24 mmol/L (ref 20–29)
Calcium: 8.8 mg/dL (ref 8.7–10.2)
Chloride: 102 mmol/L (ref 96–106)
Creatinine, Ser: 0.92 mg/dL (ref 0.57–1.00)
Globulin, Total: 2.3 g/dL (ref 1.5–4.5)
Glucose: 92 mg/dL (ref 70–99)
Potassium: 3.9 mmol/L (ref 3.5–5.2)
Sodium: 140 mmol/L (ref 134–144)
Total Protein: 6.3 g/dL (ref 6.0–8.5)
eGFR: 84 mL/min/1.73

## 2024-02-02 LAB — CBC WITH DIFFERENTIAL/PLATELET
Basophils Absolute: 0.1 x10E3/uL (ref 0.0–0.2)
Basos: 1 %
EOS (ABSOLUTE): 0.4 x10E3/uL (ref 0.0–0.4)
Eos: 4 %
Hematocrit: 37.7 % (ref 34.0–46.6)
Hemoglobin: 12.4 g/dL (ref 11.1–15.9)
Immature Grans (Abs): 0 x10E3/uL (ref 0.0–0.1)
Immature Granulocytes: 0 %
Lymphocytes Absolute: 4.6 x10E3/uL — ABNORMAL HIGH (ref 0.7–3.1)
Lymphs: 48 %
MCH: 26.6 pg (ref 26.6–33.0)
MCHC: 32.9 g/dL (ref 31.5–35.7)
MCV: 81 fL (ref 79–97)
Monocytes Absolute: 0.6 x10E3/uL (ref 0.1–0.9)
Monocytes: 7 %
Neutrophils Absolute: 3.9 x10E3/uL (ref 1.4–7.0)
Neutrophils: 40 %
Platelets: 376 x10E3/uL (ref 150–450)
RBC: 4.67 x10E6/uL (ref 3.77–5.28)
RDW: 14.1 % (ref 11.7–15.4)
WBC: 9.6 x10E3/uL (ref 3.4–10.8)

## 2024-02-02 LAB — LIPID PANEL
Chol/HDL Ratio: 2.3 ratio (ref 0.0–4.4)
Cholesterol, Total: 135 mg/dL (ref 100–199)
HDL: 58 mg/dL
LDL Chol Calc (NIH): 66 mg/dL (ref 0–99)
Triglycerides: 50 mg/dL (ref 0–149)
VLDL Cholesterol Cal: 11 mg/dL (ref 5–40)

## 2024-02-02 LAB — T4, FREE: Free T4: 1.09 ng/dL (ref 0.82–1.77)

## 2024-02-02 LAB — VITAMIN D 25 HYDROXY (VIT D DEFICIENCY, FRACTURES): Vit D, 25-Hydroxy: 15 ng/mL — AB (ref 30.0–100.0)

## 2024-02-02 LAB — VITAMIN B12: Vitamin B-12: 688 pg/mL (ref 232–1245)

## 2024-02-02 LAB — HEPATITIS B SURFACE ANTIBODY,QUALITATIVE: Hep B Surface Ab, Qual: NONREACTIVE

## 2024-02-02 LAB — TSH: TSH: 2.25 u[IU]/mL (ref 0.450–4.500)

## 2024-02-02 MED ORDER — VITAMIN D (ERGOCALCIFEROL) 1.25 MG (50000 UNIT) PO CAPS: 50000.0000 [IU] | ORAL_CAPSULE | ORAL | 0 refills | Status: AC

## 2024-02-08 ENCOUNTER — Emergency Department (HOSPITAL_COMMUNITY)
Admission: EM | Admit: 2024-02-08 | Discharge: 2024-02-09 | Disposition: A | Discharge status: Home / Self Care | Attending: Emergency Medicine | Admitting: Emergency Medicine

## 2024-02-08 ENCOUNTER — Encounter (HOSPITAL_COMMUNITY): Payer: Self-pay

## 2024-02-08 ENCOUNTER — Other Ambulatory Visit: Payer: Self-pay

## 2024-02-08 DIAGNOSIS — Z91040 Latex allergy status: Secondary | ICD-10-CM | POA: Insufficient documentation

## 2024-02-08 DIAGNOSIS — R1033 Periumbilical pain: Secondary | ICD-10-CM | POA: Diagnosis present

## 2024-02-08 LAB — CBC WITH DIFFERENTIAL/PLATELET
Abs Immature Granulocytes: 0.01 K/uL (ref 0.00–0.07)
Basophils Absolute: 0 K/uL (ref 0.0–0.1)
Basophils Relative: 1 %
Eosinophils Absolute: 0.4 K/uL (ref 0.0–0.5)
Eosinophils Relative: 5 %
HCT: 39 % (ref 36.0–46.0)
Hemoglobin: 13.3 g/dL (ref 12.0–15.0)
Immature Granulocytes: 0 %
Lymphocytes Relative: 48 %
Lymphs Abs: 3.8 K/uL (ref 0.7–4.0)
MCH: 26.2 pg (ref 26.0–34.0)
MCHC: 34.1 g/dL (ref 30.0–36.0)
MCV: 76.8 fL — ABNORMAL LOW (ref 80.0–100.0)
Monocytes Absolute: 0.4 K/uL (ref 0.1–1.0)
Monocytes Relative: 5 %
Neutro Abs: 3.2 K/uL (ref 1.7–7.7)
Neutrophils Relative %: 41 %
Platelets: 413 K/uL — ABNORMAL HIGH (ref 150–400)
RBC: 5.08 MIL/uL (ref 3.87–5.11)
RDW: 13.8 % (ref 11.5–15.5)
WBC: 7.8 K/uL (ref 4.0–10.5)
nRBC: 0 % (ref 0.0–0.2)

## 2024-02-08 LAB — COMPREHENSIVE METABOLIC PANEL WITH GFR
ALT: 13 U/L (ref 0–44)
AST: 15 U/L (ref 15–41)
Albumin: 4.1 g/dL (ref 3.5–5.0)
Alkaline Phosphatase: 65 U/L (ref 38–126)
Anion gap: 11 (ref 5–15)
BUN: 15 mg/dL (ref 6–20)
CO2: 25 mmol/L (ref 22–32)
Calcium: 9.2 mg/dL (ref 8.9–10.3)
Chloride: 103 mmol/L (ref 98–111)
Creatinine, Ser: 0.89 mg/dL (ref 0.44–1.00)
GFR, Estimated: >60 mL/min
Glucose, Bld: 89 mg/dL (ref 70–99)
Potassium: 4.1 mmol/L (ref 3.5–5.1)
Sodium: 139 mmol/L (ref 135–145)
Total Bilirubin: 0.3 mg/dL (ref 0.0–1.2)
Total Protein: 7.1 g/dL (ref 6.5–8.1)

## 2024-02-08 LAB — LIPASE, BLOOD: Lipase: 19 U/L (ref 11–51)

## 2024-02-08 LAB — HCG, SERUM, QUALITATIVE: Preg, Serum: NEGATIVE

## 2024-02-08 NOTE — ED Triage Notes (Signed)
 Pt here at the lobby with her child 34 years old. Pt oriented by policy due to high volume on flu and Covid symptoms childrens under 10 years old should not been at the lobby, pt got upset with staff states we saw her coming to the ER with the child and we didn't told her then. Pt oriented this is for her own child good and Security notified. Child taken out of the lobby with a family member.

## 2024-02-08 NOTE — ED Triage Notes (Addendum)
 Pt to ED c/o abdominal pain x months., Reports looked on goodgle and thinks has endometriosis

## 2024-02-08 NOTE — ED Triage Notes (Signed)
 Pt arrive POV for c/o abd pain for a while. States she is being check for same with not answer. Denies any vaginal discharge, bleeding or urinary symptoms.

## 2024-02-08 NOTE — ED Provider Triage Note (Signed)
 Emergency Medicine Provider Triage Evaluation Note  Tiffany [name] , a 34 y.o. female  was evaluated in triage.  Pt complains of worsening abdominal pain for the past few days.  Pain is around the umbilical area.  States intermittent pain in this area for several years.  Has been evaluated several times with no acute answers.  Notes pain is associated with her menstrual cycle  Review of Systems  Positive: Abdominal pain Negative: Fevers, nausea, vomiting  Physical Exam  BP 110/66 (BP Location: Right Arm)   Pulse 85   Temp 98.2 F (36.8 C)   Resp 16   Ht 5' 7 (1.702 m)   Wt 120.7 kg   SpO2 96%   BMI 41.66 kg/m  Gen:   Awake, no distress   Resp:  Normal effort  MSK:   Moves extremities without difficulty  Other:  Periumbilical pain  Medical Decision Making  Medically screening exam initiated at 9:59 PM.  Appropriate orders placed.  Tiffany [name] was informed that the remainder of the evaluation will be completed by another provider, this initial triage assessment does not replace that evaluation, and the importance of remaining in the ED until their evaluation is complete.     Neysa Thersia RAMAN, PA-C 02/08/24 2200

## 2024-02-09 ENCOUNTER — Encounter: Payer: Self-pay | Admitting: Family Medicine

## 2024-02-09 MED ORDER — IBUPROFEN 800 MG PO TABS: 800.0000 mg | ORAL_TABLET | Freq: Three times a day (TID) | ORAL | 0 refills | Status: AC | PRN

## 2024-02-09 MED ORDER — FAMOTIDINE 20 MG PO TABS: 20.0000 mg | ORAL_TABLET | Freq: Two times a day (BID) | ORAL | 0 refills | Status: AC

## 2024-02-09 MED ORDER — KETOROLAC TROMETHAMINE 60 MG/2ML IM SOLN
60.0000 mg | Freq: Once | INTRAMUSCULAR | Status: AC
  Administered 2024-02-09: 60 mg via INTRAMUSCULAR
  Filled 2024-02-09: qty 2

## 2024-02-09 NOTE — ED Provider Notes (Signed)
 " [place] EMERGENCY DEPARTMENT AT [facility] Provider Note   CSN: [id number] Arrival date & time: 02/08/24  2137     Patient presents with: Abdominal Pain   Tiffany [name] is a 34 y.o. female.   The history is provided by the patient and medical records.  Abdominal Pain Tiffany [name] is a 34 y.o. female who presents to the Emergency Department complaining of abdominal pain.  She presents to the emergency department for evaluation of several years of periumbilical burning abdominal pain.  Pain is worse with movement.  Pain is also worse with her menstrual cycles.  She has seen her PCP and is getting referred to her OB/GYN but has not seen her yet.  No fevers, vomiting, diarrhea, dysuria, vaginal discharge.  No new sexual partners.  She does get nausea at times when she has pain.  She has no known medical problems.      Prior to Admission medications  Medication Sig Start Date End Date Taking? Authorizing Provider  famotidine  (PEPCID ) 20 MG tablet Take 1 tablet (20 mg total) by mouth 2 (two) times daily. 02/09/24  Yes Griselda Norris, MD  ibuprofen  (ADVIL ) 800 MG tablet Take 1 tablet (800 mg total) by mouth every 8 (eight) hours as needed. 02/09/24  Yes Griselda Norris, MD  sertraline  (ZOLOFT ) 25 MG tablet Take 1 tablet (25 mg total) by mouth daily. 02/01/24   Severa Rock HERO, FNP  traZODone  (DESYREL ) 50 MG tablet 25-50 mg at night for insomnia 02/01/24   Rakes, Rock HERO, FNP  Vitamin D , Ergocalciferol , (DRISDOL ) 1.25 MG (50000 UNIT) CAPS capsule Take 1 capsule (50,000 Units total) by mouth every 7 (seven) days. 02/02/24   Severa Rock HERO, FNP    Allergies: Other and Latex    Review of Systems  Gastrointestinal:  Positive for abdominal pain.  All other systems reviewed and are negative.   Updated Vital Signs BP 108/65   Pulse 77   Temp 98.1 F (36.7 C)   Resp 16   Ht 5' 7 (1.702 m)   Wt 120.7 kg   SpO2 100%   BMI 41.66 kg/m   Physical Exam Vitals and nursing note  reviewed.  Constitutional:      Appearance: She is well-developed.  HENT:     Head: Normocephalic and atraumatic.  Cardiovascular:     Rate and Rhythm: Normal rate and regular rhythm.  Pulmonary:     Effort: Pulmonary effort is normal. No respiratory distress.  Abdominal:     Palpations: Abdomen is soft.     Tenderness: There is no guarding or rebound.     Comments: Mild generalized abdominal tenderness  Musculoskeletal:        General: No tenderness.  Skin:    General: Skin is warm and dry.  Neurological:     Mental Status: She is alert and oriented to person, place, and time.  Psychiatric:        Behavior: Behavior normal.     (all labs ordered are listed, but only abnormal results are displayed) Labs Reviewed  CBC WITH DIFFERENTIAL/PLATELET - Abnormal; Notable for the following components:      Result Value   MCV 76.8 (*)    Platelets 413 (*)    All other components within normal limits  COMPREHENSIVE METABOLIC PANEL WITH GFR  HCG, SERUM, QUALITATIVE  LIPASE, BLOOD  URINALYSIS, ROUTINE W REFLEX MICROSCOPIC    EKG: None  Radiology: No results found.   Procedures   Medications Ordered  in the ED  ketorolac  (TORADOL ) injection 60 mg (has no administration in time range)                                    Medical Decision Making Risk Prescription drug management.   Patient here for evaluation of chronic abdominal pain.  She is nontoxic-appearing on evaluation with no peritonitis on examination.  She does have very mild generalized tenderness.  Current picture is not consistent with pancreatitis, cholecystitis, perforated viscus, appendicitis, TOA.  She has had prior imaging of her abdomen with ultrasound and CT scan, which has been unrevealing of source of pain.  Discussed with patient need for ongoing outpatient workup for further evaluation of her symptoms.  Discussed return precautions for progressive or new concerning symptoms.     Final diagnoses:   Periumbilical abdominal pain    ED Discharge Orders          Ordered    famotidine  (PEPCID ) 20 MG tablet  2 times daily        02/09/24 0137    ibuprofen  (ADVIL ) 800 MG tablet  Every 8 hours PRN        02/09/24 0137               Griselda Norris, MD 02/09/24 0141  "

## 2024-02-10 ENCOUNTER — Telehealth: Payer: Self-pay

## 2024-02-10 DIAGNOSIS — R109 Unspecified abdominal pain: Secondary | ICD-10-CM

## 2024-02-10 NOTE — Progress Notes (Signed)
 Complex Care Management Note Care Guide Note  02/10/2024 Name: Tiffany [name]. Berninger MRN: [id number] DOB: 1990/04/13   Complex Care Management Outreach Attempts: An unsuccessful telephone outreach was attempted today to offer the patient information about available complex care management services.  Follow Up Plan:  Additional outreach attempts will be made to offer the patient complex care management information and services.   Encounter Outcome:  No Answer  Dreama [facility]  [facility] VBCI Assistant Direct Dial: [phone]  Fax: [phone]

## 2024-02-14 NOTE — Progress Notes (Signed)
 Complex Care Management Note  Care Guide Note 02/14/2024 Name: Tiffany [name]. Sax MRN: [id number] DOB: [birth year]  Tiffany [name] is a 34 y.o. year old female who sees Rakes, Rock HERO, FNP for primary care. I reached out to Arabell S. Maurer by phone today to offer complex care management services.  [name] was given information about Complex Care Management services today including:   The Complex Care Management services include support from the care team which includes your Nurse Care Manager, Clinical Social Worker, or Pharmacist.  The Complex Care Management team is here to help remove barriers to the health concerns and goals most important to you. Complex Care Management services are voluntary, and the patient may decline or stop services at any time by request to their care team member.   Complex Care Management Consent Status: Patient did not agree to participate in complex care management services at this time.  Encounter Outcome:  Patient Refused  [place] Agent [facility], [facility] VBCI Assistant Direct Dial: [phone]  Fax: [phone]

## 2024-02-15 ENCOUNTER — Encounter: Payer: Self-pay | Admitting: *Deleted

## 2024-03-13 ENCOUNTER — Encounter: Admitting: Women's Health

## 2024-05-02 ENCOUNTER — Ambulatory Visit: Admitting: Family Medicine

## 2024-05-10 ENCOUNTER — Ambulatory Visit: Admitting: Family Medicine
